# Patient Record
Sex: Male | Born: 1955 | Race: White | Hispanic: No | Smoking: Former smoker
Health system: Southern US, Community
[De-identification: ages and names within clinical notes are randomized; demographics above are authoritative.]

## PROBLEM LIST (undated history)

## (undated) DIAGNOSIS — F32A Depression, unspecified: Secondary | ICD-10-CM

## (undated) DIAGNOSIS — M1990 Unspecified osteoarthritis, unspecified site: Secondary | ICD-10-CM

## (undated) DIAGNOSIS — G709 Myoneural disorder, unspecified: Secondary | ICD-10-CM

## (undated) DIAGNOSIS — C4491 Basal cell carcinoma of skin, unspecified: Secondary | ICD-10-CM

## (undated) DIAGNOSIS — E7800 Pure hypercholesterolemia, unspecified: Secondary | ICD-10-CM

## (undated) DIAGNOSIS — Z8739 Personal history of other diseases of the musculoskeletal system and connective tissue: Secondary | ICD-10-CM

## (undated) DIAGNOSIS — F1910 Other psychoactive substance abuse, uncomplicated: Secondary | ICD-10-CM

## (undated) DIAGNOSIS — G4730 Sleep apnea, unspecified: Secondary | ICD-10-CM

## (undated) DIAGNOSIS — I10 Essential (primary) hypertension: Secondary | ICD-10-CM

## (undated) DIAGNOSIS — G4733 Obstructive sleep apnea (adult) (pediatric): Secondary | ICD-10-CM

## (undated) DIAGNOSIS — Z87442 Personal history of urinary calculi: Secondary | ICD-10-CM

## (undated) DIAGNOSIS — Z9989 Dependence on other enabling machines and devices: Secondary | ICD-10-CM

## (undated) HISTORY — DX: Myoneural disorder, unspecified: G70.9

## (undated) HISTORY — PX: BACK SURGERY: SHX140

## (undated) HISTORY — PX: FRACTURE SURGERY: SHX138

## (undated) HISTORY — DX: Other psychoactive substance abuse, uncomplicated: F19.10

## (undated) HISTORY — PX: COLONOSCOPY: SHX174

## (undated) HISTORY — DX: Depression, unspecified: F32.A

## (undated) HISTORY — DX: Basal cell carcinoma of skin, unspecified: C44.91

## (undated) HISTORY — PX: SPINE SURGERY: SHX786

## (undated) HISTORY — PX: HERNIA REPAIR: SHX51

## (undated) HISTORY — PX: BASAL CELL CARCINOMA EXCISION: SHX1214

## (undated) HISTORY — DX: Essential (primary) hypertension: I10

## (undated) HISTORY — PX: UMBILICAL HERNIA REPAIR: SHX196

---

## 1976-07-27 HISTORY — PX: PATELLA FRACTURE SURGERY: SHX735

## 1999-07-28 HISTORY — PX: HEMIARTHROPLASTY SHOULDER FRACTURE: SUR653

## 2008-12-06 LAB — HM COLONOSCOPY: HM COLON: NORMAL

## 2014-10-29 ENCOUNTER — Ambulatory Visit (INDEPENDENT_AMBULATORY_CARE_PROVIDER_SITE_OTHER): Payer: Managed Care, Other (non HMO) | Admitting: Internal Medicine

## 2014-10-29 ENCOUNTER — Other Ambulatory Visit (INDEPENDENT_AMBULATORY_CARE_PROVIDER_SITE_OTHER): Payer: Managed Care, Other (non HMO)

## 2014-10-29 ENCOUNTER — Encounter: Payer: Self-pay | Admitting: Internal Medicine

## 2014-10-29 VITALS — BP 122/84 | HR 71 | Temp 98.0°F | Resp 16 | Ht 72.0 in | Wt 253.0 lb

## 2014-10-29 DIAGNOSIS — Z23 Encounter for immunization: Secondary | ICD-10-CM | POA: Diagnosis not present

## 2014-10-29 DIAGNOSIS — I10 Essential (primary) hypertension: Secondary | ICD-10-CM | POA: Diagnosis not present

## 2014-10-29 DIAGNOSIS — E785 Hyperlipidemia, unspecified: Secondary | ICD-10-CM

## 2014-10-29 DIAGNOSIS — M170 Bilateral primary osteoarthritis of knee: Secondary | ICD-10-CM | POA: Diagnosis not present

## 2014-10-29 LAB — COMPREHENSIVE METABOLIC PANEL
ALT: 38 U/L (ref 0–53)
AST: 25 U/L (ref 0–37)
Albumin: 4.5 g/dL (ref 3.5–5.2)
Alkaline Phosphatase: 58 U/L (ref 39–117)
BILIRUBIN TOTAL: 0.5 mg/dL (ref 0.2–1.2)
BUN: 21 mg/dL (ref 6–23)
CALCIUM: 10.1 mg/dL (ref 8.4–10.5)
CHLORIDE: 99 meq/L (ref 96–112)
CO2: 31 mEq/L (ref 19–32)
CREATININE: 1.17 mg/dL (ref 0.40–1.50)
GFR: 67.57 mL/min (ref >60.00)
Glucose, Bld: 93 mg/dL (ref 70–99)
Potassium: 4.5 mEq/L (ref 3.5–5.1)
Sodium: 137 mEq/L (ref 135–145)
Total Protein: 7.6 g/dL (ref 6.0–8.3)

## 2014-10-29 LAB — URINALYSIS, ROUTINE W REFLEX MICROSCOPIC
Bilirubin Urine: NEGATIVE
Hgb urine dipstick: NEGATIVE
Ketones, ur: NEGATIVE
Leukocytes, UA: NEGATIVE
NITRITE: NEGATIVE
PH: 5.5 (ref 5.0–8.0)
RBC / HPF: NONE SEEN (ref 0–?)
SPECIFIC GRAVITY, URINE: 1.02 (ref 1.000–1.030)
TOTAL PROTEIN, URINE-UPE24: NEGATIVE
URINE GLUCOSE: NEGATIVE
Urobilinogen, UA: 0.2 (ref 0.0–1.0)

## 2014-10-29 LAB — CBC WITH DIFFERENTIAL/PLATELET
BASOS PCT: 0.8 % (ref 0.0–3.0)
Basophils Absolute: 0.1 10*3/uL (ref 0.0–0.1)
EOS ABS: 0.3 10*3/uL (ref 0.0–0.7)
Eosinophils Relative: 3.7 % (ref 0.0–5.0)
HCT: 46 % (ref 39.0–52.0)
Hemoglobin: 16 g/dL (ref 13.0–17.0)
LYMPHS ABS: 2 10*3/uL (ref 0.7–4.0)
LYMPHS PCT: 25.1 % (ref 12.0–46.0)
MCHC: 34.8 g/dL (ref 30.0–36.0)
MCV: 90.3 fl (ref 78.0–100.0)
MONO ABS: 0.6 10*3/uL (ref 0.1–1.0)
Monocytes Relative: 7.9 % (ref 3.0–12.0)
NEUTROS PCT: 62.5 % (ref 43.0–77.0)
Neutro Abs: 5 10*3/uL (ref 1.4–7.7)
Platelets: 299 10*3/uL (ref 150.0–400.0)
RBC: 5.1 Mil/uL (ref 4.22–5.81)
RDW: 12.9 % (ref 11.5–15.5)
WBC: 7.9 10*3/uL (ref 4.0–10.5)

## 2014-10-29 LAB — TSH: TSH: 1.19 u[IU]/mL (ref 0.35–4.50)

## 2014-10-29 LAB — LIPID PANEL
CHOLESTEROL: 201 mg/dL — AB (ref 0–200)
HDL: 55.1 mg/dL (ref >39.00)
NonHDL: 145.9
TRIGLYCERIDES: 311 mg/dL — AB (ref 0.0–149.0)
Total CHOL/HDL Ratio: 4
VLDL: 62.2 mg/dL — ABNORMAL HIGH (ref 0.0–40.0)

## 2014-10-29 LAB — LDL CHOLESTEROL, DIRECT: LDL DIRECT: 104 mg/dL

## 2014-10-29 MED ORDER — DICLOFENAC SODIUM 2 % TD SOLN: 4.0000 | Freq: Two times a day (BID) | TRANSDERMAL | Status: DC

## 2014-10-29 NOTE — Progress Notes (Signed)
   Subjective:    Patient ID: Henry Figueroa, male    DOB: 1955-11-16, 59 y.o.   MRN: [id number]  Hypertension This is a chronic problem. The current episode started more than 1 year ago. The problem has been gradually improving since onset. The problem is controlled. Pertinent negatives include no anxiety, blurred vision, chest pain, headaches, malaise/fatigue, neck pain, orthopnea, palpitations, peripheral edema, PND, shortness of breath or sweats. Past treatments include angiotensin blockers and diuretics. The current treatment provides significant improvement. There are no compliance problems.       Review of Systems  Constitutional: Negative.  Negative for fever, chills, malaise/fatigue, diaphoresis, appetite change and fatigue.  HENT: Negative.   Eyes: Negative.  Negative for blurred vision.  Respiratory: Negative.  Negative for cough, choking, chest tightness, shortness of breath and stridor.   Cardiovascular: Negative.  Negative for chest pain, palpitations, orthopnea, leg swelling and PND.  Gastrointestinal: Negative.  Negative for nausea, vomiting, abdominal pain, diarrhea and constipation.  Endocrine: Negative.   Genitourinary: Negative.   Musculoskeletal: Positive for arthralgias. Negative for myalgias, back pain, joint swelling, gait problem, neck pain and neck stiffness.       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  Skin: Negative.   Allergic/Immunologic: Negative.   Neurological: Negative.  Negative for dizziness, tremors, weakness, light-headedness, numbness and headaches.  Hematological: Negative.  Negative for adenopathy. Does not bruise/bleed easily.  Psychiatric/Behavioral: Negative.        Objective:   Physical Exam  Constitutional: He is oriented to person, place, and time. He appears well-developed and well-nourished. No distress.  HENT:  Head: Normocephalic and  atraumatic.  Mouth/Throat: Oropharynx is clear and moist. No oropharyngeal exudate.  Eyes: Conjunctivae are normal. Right eye exhibits no discharge. Left eye exhibits no discharge. No scleral icterus.  Neck: Normal range of motion. Neck supple. No JVD present. No tracheal deviation present. No thyromegaly present.  Cardiovascular: Normal rate, regular rhythm, normal heart sounds and intact distal pulses.  Exam reveals no gallop and no friction rub.   No murmur heard. Pulmonary/Chest: Effort normal and breath sounds normal. No stridor. No respiratory distress. He has no wheezes. He has no rales. He exhibits no tenderness.  Abdominal: Soft. Bowel sounds are normal. He exhibits no distension and no mass. There is no tenderness. There is no rebound and no guarding.  Musculoskeletal: Normal range of motion. He exhibits no edema.       Right knee: Normal.       Left knee: Normal.  Lymphadenopathy:    He has no cervical adenopathy.  Neurological: He is oriented to person, place, and time.  Skin: Skin is warm and dry. No rash noted. He is not diaphoretic. No erythema. No pallor.  Psychiatric: He has a normal mood and affect. His behavior is normal. Judgment and thought content normal.  Vitals reviewed.   No results found for: WBC, HGB, HCT, PLT, GLUCOSE, CHOL, TRIG, HDL, LDLDIRECT, LDLCALC, ALT, AST, NA, K, CL, CREATININE, BUN, CO2, TSH, PSA, INR, GLUF, HGBA1C, MICROALBUR      Assessment & Plan:

## 2014-10-29 NOTE — Patient Instructions (Signed)

## 2014-10-29 NOTE — Progress Notes (Signed)
Pre visit review using our clinic review tool, if applicable. No additional management support is needed unless otherwise documented below in the visit note. 

## 2014-10-29 NOTE — Assessment & Plan Note (Signed)
His BP is well controlled Will monitor his lytes and renal function today

## 2014-10-29 NOTE — Assessment & Plan Note (Signed)
FLP today Will treat if indicated

## 2014-10-29 NOTE — Assessment & Plan Note (Signed)
Will start Pennsaid for this

## 2014-10-30 ENCOUNTER — Encounter: Payer: Self-pay | Admitting: Internal Medicine

## 2014-11-20 ENCOUNTER — Telehealth: Payer: Self-pay | Admitting: Internal Medicine

## 2014-11-20 DIAGNOSIS — M170 Bilateral primary osteoarthritis of knee: Secondary | ICD-10-CM

## 2014-11-20 NOTE — Telephone Encounter (Signed)
Pharmacy needs prior authorization for Diclofenac Sodium (PENNSAID) 2 % SOLN [[id number]. [employer] insurance prefers Voltaren gel. Prior authorization # is [id number] if you want to keep PENNSAID.

## 2014-11-23 MED ORDER — DICLOFENAC SODIUM 1 % TD GEL: 4.0000 g | Freq: Four times a day (QID) | TRANSDERMAL | Status: DC

## 2014-11-23 NOTE — Telephone Encounter (Signed)
Please advise if you want to change or proceed with PA. Thanks

## 2014-11-23 NOTE — Telephone Encounter (Signed)
changed

## 2014-12-04 ENCOUNTER — Telehealth: Payer: Self-pay | Admitting: Internal Medicine

## 2014-12-04 MED ORDER — VALSARTAN-HYDROCHLOROTHIAZIDE 320-25 MG PO TABS: 1.0000 | ORAL_TABLET | Freq: Every day | ORAL | Status: DC

## 2014-12-04 NOTE — Telephone Encounter (Signed)
Pt called and would like a refill on her valsartan-hydrochlorothiazide (DIOVAN-HCT) 320-25 MG per tablet [[id number]] .     [facility] [address] [facility] [place]

## 2014-12-04 NOTE — Telephone Encounter (Signed)
Done

## 2015-03-26 ENCOUNTER — Ambulatory Visit (INDEPENDENT_AMBULATORY_CARE_PROVIDER_SITE_OTHER): Payer: Managed Care, Other (non HMO) | Admitting: Internal Medicine

## 2015-03-26 ENCOUNTER — Other Ambulatory Visit (INDEPENDENT_AMBULATORY_CARE_PROVIDER_SITE_OTHER): Payer: Managed Care, Other (non HMO)

## 2015-03-26 ENCOUNTER — Other Ambulatory Visit: Payer: Self-pay | Admitting: *Deleted

## 2015-03-26 ENCOUNTER — Encounter: Payer: Self-pay | Admitting: Internal Medicine

## 2015-03-26 VITALS — BP 118/70 | HR 86 | Temp 97.8°F | Resp 16 | Ht 71.0 in | Wt 269.0 lb

## 2015-03-26 DIAGNOSIS — G609 Hereditary and idiopathic neuropathy, unspecified: Secondary | ICD-10-CM

## 2015-03-26 DIAGNOSIS — G629 Polyneuropathy, unspecified: Secondary | ICD-10-CM

## 2015-03-26 DIAGNOSIS — C44519 Basal cell carcinoma of skin of other part of trunk: Secondary | ICD-10-CM

## 2015-03-26 DIAGNOSIS — G608 Other hereditary and idiopathic neuropathies: Secondary | ICD-10-CM

## 2015-03-26 LAB — CBC WITH DIFFERENTIAL/PLATELET
BASOS ABS: 0 10*3/uL (ref 0.0–0.1)
Basophils Relative: 0.4 % (ref 0.0–3.0)
EOS ABS: 0.2 10*3/uL (ref 0.0–0.7)
Eosinophils Relative: 3.3 % (ref 0.0–5.0)
HEMATOCRIT: 44.9 % (ref 39.0–52.0)
Hemoglobin: 15.4 g/dL (ref 13.0–17.0)
LYMPHS ABS: 2.1 10*3/uL (ref 0.7–4.0)
LYMPHS PCT: 31.5 % (ref 12.0–46.0)
MCHC: 34.4 g/dL (ref 30.0–36.0)
MCV: 94 fl (ref 78.0–100.0)
MONOS PCT: 9 % (ref 3.0–12.0)
Monocytes Absolute: 0.6 10*3/uL (ref 0.1–1.0)
NEUTROS ABS: 3.7 10*3/uL (ref 1.4–7.7)
NEUTROS PCT: 55.8 % (ref 43.0–77.0)
PLATELETS: 240 10*3/uL (ref 150.0–400.0)
RBC: 4.78 Mil/uL (ref 4.22–5.81)
RDW: 13.5 % (ref 11.5–15.5)
WBC: 6.6 10*3/uL (ref 4.0–10.5)

## 2015-03-26 LAB — BASIC METABOLIC PANEL
BUN: 22 mg/dL (ref 6–23)
CALCIUM: 10.4 mg/dL (ref 8.4–10.5)
CO2: 31 meq/L (ref 19–32)
CREATININE: 1.34 mg/dL (ref 0.40–1.50)
Chloride: 102 mEq/L (ref 96–112)
GFR: 57.7 mL/min — AB (ref >60.00)
Glucose, Bld: 103 mg/dL — ABNORMAL HIGH (ref 70–99)
Potassium: 3.9 mEq/L (ref 3.5–5.1)
SODIUM: 142 meq/L (ref 135–145)

## 2015-03-26 LAB — SEDIMENTATION RATE: Sed Rate: 6 mm/hr (ref 0–22)

## 2015-03-26 LAB — TSH: TSH: 1.1 u[IU]/mL (ref 0.35–4.50)

## 2015-03-26 LAB — HEMOGLOBIN A1C: HEMOGLOBIN A1C: 5.1 % (ref 4.6–6.5)

## 2015-03-26 LAB — FOLATE: Folate: >24.8 ng/mL (ref >5.9)

## 2015-03-26 LAB — VITAMIN B12: VITAMIN B 12: 386 pg/mL (ref 211–911)

## 2015-03-26 NOTE — Progress Notes (Signed)
Subjective:  Patient ID: Henry Figueroa, male    DOB: 15-Mar-1956  Age: 59 y.o. MRN: [id number]  CC: Numbness   HPI Laymon Stockert presents for a several year history of numbness, symmetrically in both feet from his toes to his forefoot. He has also noticed a lesion on his right mid back for several months that he said feels like a scar.  Outpatient Prescriptions Prior to Visit  Medication Sig Dispense Refill  . diclofenac sodium (VOLTAREN) 1 % GEL Apply 4 g topically 4 (four) times daily. 100 g 11  . valsartan-hydrochlorothiazide (DIOVAN-HCT) 320-25 MG per tablet Take 1 tablet by mouth daily. 90 tablet 3   No facility-administered medications prior to visit.    ROS Review of Systems  Constitutional: Negative.   HENT: Negative.   Eyes: Negative.  Negative for photophobia and visual disturbance.  Respiratory: Negative.  Negative for cough, choking, chest tightness, shortness of breath and stridor.   Cardiovascular: Negative.  Negative for chest pain, palpitations and leg swelling.  Gastrointestinal: Negative.  Negative for abdominal pain, diarrhea and constipation.  Endocrine: Negative.   Genitourinary: Negative.   Musculoskeletal: Negative.  Negative for myalgias, back pain, joint swelling, arthralgias and neck pain.  Skin: Positive for rash. Negative for color change and pallor.  Allergic/Immunologic: Negative.   Neurological: Positive for numbness. Negative for dizziness, tremors, seizures, syncope, facial asymmetry, speech difficulty, weakness, light-headedness and headaches.  Hematological: Negative.  Negative for adenopathy. Does not bruise/bleed easily.  Psychiatric/Behavioral: Negative.     Objective:  BP 118/70 mmHg  Pulse 86  Temp(Src) 97.8 F (36.6 C) (Oral)  Resp 16  Ht 5\' 11"  (1.803 m)  Wt 269 lb (122.018 kg)  BMI 37.53 kg/m2  SpO2 97%  BP Readings from Last 3 Encounters:  03/26/15 118/70  10/29/14 122/84    Wt Readings from Last 3 Encounters:  03/26/15  269 lb (122.018 kg)  10/29/14 253 lb (114.76 kg)    Physical Exam  Constitutional: He is oriented to person, place, and time. He appears well-developed and well-nourished. No distress.  HENT:  Head: Normocephalic and atraumatic.  Mouth/Throat: Oropharynx is clear and moist. No oropharyngeal exudate.  Eyes: Conjunctivae and EOM are normal. Pupils are equal, round, and reactive to light. Right eye exhibits no discharge. Left eye exhibits no discharge. No scleral icterus.  Neck: Normal range of motion. Neck supple. No JVD present. No tracheal deviation present. No thyromegaly present.  Cardiovascular: Normal rate, regular rhythm, normal heart sounds and intact distal pulses.  Exam reveals no gallop and no friction rub.   No murmur heard. Pulses:      Carotid pulses are 1+ on the right side, and 1+ on the left side.      Radial pulses are 1+ on the right side, and 1+ on the left side.       Femoral pulses are 1+ on the right side, and 1+ on the left side.      Popliteal pulses are 1+ on the right side, and 1+ on the left side.       Dorsalis pedis pulses are 1+ on the right side, and 1+ on the left side.       Posterior tibial pulses are 1+ on the right side, and 1+ on the left side.  Pulmonary/Chest: Effort normal and breath sounds normal. No stridor. No respiratory distress. He has no wheezes. He has no rales. He exhibits no tenderness.  Abdominal: Soft. Bowel sounds are normal. He exhibits no distension  and no mass. There is no tenderness. There is no rebound and no guarding.  Musculoskeletal: Normal range of motion. He exhibits no edema.       Arms: Lymphadenopathy:    He has no cervical adenopathy.  Neurological: He is alert and oriented to person, place, and time. He has normal strength. He displays no atrophy, no tremor and normal reflexes. A sensory deficit is present. No cranial nerve deficit. He exhibits normal muscle tone. He displays a negative Romberg sign. He displays no seizure  activity. Coordination and gait normal.  Reflex Scores:      Tricep reflexes are 0 on the right side and 0 on the left side.      Bicep reflexes are 0 on the right side and 0 on the left side.      Brachioradialis reflexes are 0 on the right side and 0 on the left side.      Patellar reflexes are 1+ on the right side and 0 on the left side.      Achilles reflexes are 0 on the right side and 0 on the left side. Over the first MTP joint there is mild decreased vibratory sensation. There is also decreased sensation of hot and cold in the forefoot.  Skin: Skin is warm and dry. No rash noted. He is not diaphoretic. No erythema. No pallor.  Psychiatric: He has a normal mood and affect. His behavior is normal. Judgment and thought content normal.  Vitals reviewed.   Lab Results  Component Value Date   WBC 7.9 10/29/2014   HGB 16.0 10/29/2014   HCT 46.0 10/29/2014   PLT 299.0 10/29/2014   GLUCOSE 93 10/29/2014   CHOL 201* 10/29/2014   TRIG 311.0* 10/29/2014   HDL 55.10 10/29/2014   LDLDIRECT 104.0 10/29/2014   ALT 38 10/29/2014   AST 25 10/29/2014   NA 137 10/29/2014   K 4.5 10/29/2014   CL 99 10/29/2014   CREATININE 1.17 10/29/2014   BUN 21 10/29/2014   CO2 31 10/29/2014   TSH 1.19 10/29/2014    Patient was never admitted.  Assessment & Plan:   Sage was seen today for numbness.  Diagnoses and all orders for this visit:  Basal cell carcinoma of back- though I don't have a pathology diagnosis this appears very suspicious for basal cell carcinoma, I offered to do a biopsy of the lesion today but he requests being referred directly to dermatology. 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 I have ordered labs to screen for a lymphoproliferative disease, HIV, vasculitis, syphilis, Wilson's disease, vitamin B12 or folate deficiency and thyroid disease. I've asked him to have  a nerve conduction study and EMG of both feet to better identify the cause for his numbness. -     Basic metabolic panel; Future -     CBC with Differential/Platelet; Future -     TSH; Future -     SPEP & IFE with QIG; Future -     Sedimentation rate; Future -     Hemoglobin A1c; Future -     Ceruloplasmin; Future -     RPR; Future -     HIV antibody; Future -     Vitamin B12; Future -     Folate; Future -     Methylmalonic acid, serum; Future -     Ambulatory referral to Neurology  I am having Mr. Louie Casa maintain his diclofenac sodium and valsartan-hydrochlorothiazide.  No orders of the defined types were placed in this encounter.     Follow-up: Return in about 3 months (around 06/26/2015).  Scarlette Calico, MD

## 2015-03-26 NOTE — Patient Instructions (Signed)

## 2015-03-26 NOTE — Progress Notes (Signed)
Pre visit review using our clinic review tool, if applicable. No additional management support is needed unless otherwise documented below in the visit note. 

## 2015-03-27 LAB — HIV ANTIBODY (ROUTINE TESTING W REFLEX): HIV 1&2 Ab, 4th Generation: NONREACTIVE

## 2015-03-27 LAB — RPR

## 2015-03-28 LAB — SPEP & IFE WITH QIG
ALBUMIN ELP: 5 g/dL — AB (ref 3.8–4.8)
ALPHA-1-GLOBULIN: 0.3 g/dL (ref 0.2–0.3)
ALPHA-2-GLOBULIN: 0.6 g/dL (ref 0.5–0.9)
BETA 2: 0.5 g/dL (ref 0.2–0.5)
BETA GLOBULIN: 0.5 g/dL (ref 0.4–0.6)
GAMMA GLOBULIN: 1.3 g/dL (ref 0.8–1.7)
IGA: 321 mg/dL (ref 68–379)
IgG (Immunoglobin G), Serum: 1230 mg/dL (ref 650–1600)
IgM, Serum: 118 mg/dL (ref 41–251)
Total Protein, Serum Electrophoresis: 8.2 g/dL — ABNORMAL HIGH (ref 6.1–8.1)

## 2015-03-28 LAB — CERULOPLASMIN: CERULOPLASMIN: 22 mg/dL (ref 18–36)

## 2015-03-29 ENCOUNTER — Encounter: Payer: Self-pay | Admitting: Internal Medicine

## 2015-03-29 LAB — METHYLMALONIC ACID, SERUM: Methylmalonic Acid, Quant: 255 nmol/L (ref 87–318)

## 2015-04-09 ENCOUNTER — Encounter: Payer: Self-pay | Admitting: Internal Medicine

## 2015-04-09 ENCOUNTER — Ambulatory Visit (INDEPENDENT_AMBULATORY_CARE_PROVIDER_SITE_OTHER): Payer: Managed Care, Other (non HMO) | Admitting: Neurology

## 2015-04-09 DIAGNOSIS — G609 Hereditary and idiopathic neuropathy, unspecified: Secondary | ICD-10-CM

## 2015-04-09 DIAGNOSIS — M5417 Radiculopathy, lumbosacral region: Secondary | ICD-10-CM

## 2015-04-09 NOTE — Procedures (Signed)
[facility]  [place] [place]  [place], [place] [address] Tel: [phone] Fax:  [phone] Test Date:  04/09/2015  Patient: Henry Figueroa DOB: May 16, 1956 Physician: Narda Amber, DO  Sex: Male Height: 6\' 0"  Ref Phys: Scarlette Calico, M.D.  ID#: [id number] Temp: 35.2C Technician: Jerilynn Mages. Dean   Patient Complaints: This is a 59 year-old gentleman presenting for evaluation of bilateral feet numbness.   NCV & EMG Findings: Extensive electrodiagnostic testing of the right lower extremity and additional studies of the left shows:  1. Bilateral sural and superficial peroneal sensory responses are within normal limits. 2. Bilateral peroneal and tibial motor responses are within normal limits. 3. Bilateral H reflex studies are mildly prolonged. 4. Chronic motor axon loss changes are seen affecting the L5-S1 myotomes bilaterally, without accompanied active denervation.  Impression: 1. Chronic L5 radiculopathy affecting bilateral lower extremities, mild-to-moderate in degree electrically. 2. Chronic S1 radiculopathy affecting bilateral lower extremities, mild in degree electrically. 3. There is no evidence of a generalized sensorimotor polyneuropathy affecting the lower extremities.   ___________________________ Narda Amber, DO    Nerve Conduction Studies Anti Sensory Summary Table   Site NR Peak (ms) Norm Peak (ms) P-T Amp (V) Norm P-T Amp  Left Sup Peroneal Anti Sensory (Ant Lat Mall)  12 cm    3.3 <4.6 5.9 >3  Right Sup Peroneal Anti Sensory (Ant Lat Mall)  12 cm    3.7 <4.6 6.7 >3  Left Sural Anti Sensory (Lat Mall)  Calf    4.3 <4.6 4.6 >3  Right Sural Anti Sensory (Lat Mall)  Calf    4.0 <4.6 6.4 >3   Motor Summary Table   Site NR Onset (ms) Norm Onset (ms) O-P Amp (mV) Norm O-P Amp Site1 Site2 Delta-0 (ms) Dist (cm) Vel (m/s) Norm Vel (m/s)  Left Peroneal Motor (Ext Dig Brev)  Ankle    3.9 <6.0 5.4 >2.5 B Fib Ankle 8.2 37.0 45 >40  B Fib    12.1  4.7   Poplt B Fib 1.8 10.0 56 >40  Poplt    13.9  4.5         Right Peroneal Motor (Ext Dig Brev)  Ankle    3.9 <6.0 6.8 >2.5 B Fib Ankle 7.9 36.0 46 >40  B Fib    11.8  6.4  Poplt B Fib 1.9 10.0 53 >40  Poplt    13.7  6.2         Left Tibial Motor (Abd Hall Brev)  Ankle    3.4 <6.0 6.7 >4 Knee Ankle 10.0 44.0 44 >40  Knee    13.4  5.0         Right Tibial Motor (Abd Hall Brev)  Ankle    3.2 <6.0 5.7 >4 Knee Ankle 10.2 45.0 44 >40  Knee    13.4  4.1          H Reflex Studies   NR H-Lat (ms) Lat Norm (ms) L-R H-Lat (ms)  Left Tibial (Gastroc)     35.51 <35 0.41  Right Tibial (Gastroc)     35.10 <35 0.41   EMG   Side Muscle Ins Act Fibs Psw Fasc Number Recrt Dur Dur. Amp Amp. Poly Poly. Comment  Right BicepsFemS Nml Nml Nml Nml 1- Mod-R Some 1+ Some 1+ Nml Nml N/A  Right AntTibialis Nml Nml Nml Nml 1- Rapid Some 1+ Some 1+ Nml Nml N/A  Right Gastroc Nml Nml Nml Nml 1- Mod-R Some 1+ Nml  Nml Nml Nml N/A  Right Flex Dig Long Nml Nml Nml Nml 1- Rapid Some 1+ Some 1+ Nml Nml N/A  Right GluteusMed Nml Nml Nml Nml 1- Rapid Some 1+ Nml Nml Nml Nml N/A  Right RectFemoris Nml Nml Nml Nml Nml Nml Nml Nml Nml Nml Nml Nml N/A  Left AntTibialis Nml Nml Nml Nml 1- Rapid Some 1+ Some 1+ Some 1+ N/A  Left Flex Dig Long Nml Nml Nml Nml 1- Rapid Many 1+ Many 1+ Some 1+ N/A  Left Gastroc Nml Nml Nml Nml 1- Mod-R Some 1+ Nml Nml Nml Nml N/A  Left RectFemoris Nml Nml Nml Nml Nml Nml Nml Nml Nml Nml Nml Nml N/A  Left GluteusMed Nml Nml Nml Nml 1- Rapid Many 1+ Many 1+ Some 1+ N/A  Left BicepsFemS Nml Nml Nml Nml 1- Mod-R Few 1+ Few 1+ Nml Nml N/A      Waveforms:

## 2015-04-10 ENCOUNTER — Other Ambulatory Visit: Payer: Self-pay | Admitting: Internal Medicine

## 2015-04-10 DIAGNOSIS — M5416 Radiculopathy, lumbar region: Secondary | ICD-10-CM

## 2015-05-14 ENCOUNTER — Telehealth: Payer: Self-pay | Admitting: Neurology

## 2015-05-14 NOTE — Telephone Encounter (Signed)
Pt called for EMG results/ call back @ [phone]

## 2015-05-14 NOTE — Telephone Encounter (Signed)
Left message instructing patient to call ordering physician's office for results.

## 2015-05-16 ENCOUNTER — Encounter: Payer: Self-pay | Admitting: Internal Medicine

## 2015-05-27 ENCOUNTER — Other Ambulatory Visit: Payer: Managed Care, Other (non HMO)

## 2015-09-14 ENCOUNTER — Ambulatory Visit (INDEPENDENT_AMBULATORY_CARE_PROVIDER_SITE_OTHER): Payer: Managed Care, Other (non HMO) | Admitting: Internal Medicine

## 2015-09-14 ENCOUNTER — Encounter: Payer: Self-pay | Admitting: Internal Medicine

## 2015-09-14 VITALS — BP 130/84 | HR 70 | Temp 97.9°F | Resp 16 | Wt 252.0 lb

## 2015-09-14 DIAGNOSIS — G629 Polyneuropathy, unspecified: Secondary | ICD-10-CM

## 2015-09-14 DIAGNOSIS — G608 Other hereditary and idiopathic neuropathies: Secondary | ICD-10-CM

## 2015-09-14 DIAGNOSIS — M7062 Trochanteric bursitis, left hip: Secondary | ICD-10-CM

## 2015-09-14 DIAGNOSIS — M5416 Radiculopathy, lumbar region: Secondary | ICD-10-CM | POA: Diagnosis not present

## 2015-09-14 MED ORDER — MELOXICAM 15 MG PO TABS: 15.0000 mg | ORAL_TABLET | Freq: Every day | ORAL | Status: DC

## 2015-09-14 NOTE — Patient Instructions (Signed)
Hip Bursitis Bursitis is a swelling and soreness (inflammation) of a fluid-filled sac (bursa). This sac overlies and protects the joints.  CAUSES   Injury.  Overuse of the muscles surrounding the joint.  Arthritis.  Gout.  Infection.  Cold weather.  Inadequate warm-up and conditioning prior to activities. The cause may not be known.  SYMPTOMS   Mild to severe irritation.  Tenderness and swelling over the outside of the hip.  Pain with motion of the hip.  If the bursa becomes infected, a fever may be present. Redness, tenderness, and warmth will develop over the hip. Symptoms usually lessen in 3 to 4 weeks with treatment, but can come back. TREATMENT If conservative treatment does not work, your caregiver may advise draining the bursa and injecting cortisone into the area. This may speed up the healing process. This may also be used as an initial treatment of choice. HOME CARE INSTRUCTIONS   Apply ice to the affected area for 15-20 minutes every 3 to 4 hours while awake for the first 2 days. Put the ice in a plastic bag and place a towel between the bag of ice and your skin.  Rest the painful joint as much as possible, but continue to put the joint through a normal range of motion at least 4 times per day. When the pain lessens, begin normal, slow movements and usual activities to help prevent stiffness of the hip.  Only take over-the-counter or prescription medicines for pain, discomfort, or fever as directed by your caregiver.  Use crutches to limit weight bearing on the hip joint, if advised.  Elevate your painful hip to reduce swelling. Use pillows for propping and cushioning your legs and hips.  Gentle massage may provide comfort and decrease swelling. SEEK IMMEDIATE MEDICAL CARE IF:   Your pain increases even during treatment, or you are not improving.  You have a fever.  You have heat and inflammation over the involved bursa.  You have any other questions or  concerns. MAKE SURE YOU:   Understand these instructions.  Will watch your condition.  Will get help right away if you are not doing well or get worse.   This information is not intended to replace advice given to you by your health care provider. Make sure you discuss any questions you have with your health care provider.   Document Released: 01/02/2002 Document Revised: 10/05/2011 Document Reviewed: 02/12/2015 Elsevier Interactive Patient Education 2016 [employer]  

## 2015-09-14 NOTE — Progress Notes (Signed)
Pre visit review using our clinic review tool, if applicable. No additional management support is needed unless otherwise documented below in the visit note. 

## 2015-09-15 NOTE — Progress Notes (Signed)
Subjective:  Patient ID: Henry Figueroa, male    DOB: [birth year]  Age: 60 y.o. MRN: [id number]  CC: Back Pain and Hip Pain   HPI Arthor Stelma presents for the complaint of left hip pain for 3 weeks. There was no preceding trauma or injury. He describes a sharp pain over the outer aspect of his left hip that radiates down the lateral aspect of his left thigh. He has taken a few doses of Tylenol that does provide some symptom relief.  He also complains of a chronic achy low back pain. When I last saw him about 8 months ago he was being evaluated for paresthesias in his feet. He had an NCS/EMG done that raised some suspicion for lumbar radiculitis. He tells me that the paresthesias in his feet are a little better.   Outpatient Prescriptions Prior to Visit  Medication Sig Dispense Refill  . diclofenac sodium (VOLTAREN) 1 % GEL Apply 4 g topically 4 (four) times daily. 100 g 11  . valsartan-hydrochlorothiazide (DIOVAN-HCT) 320-25 MG per tablet Take 1 tablet by mouth daily. 90 tablet 3   No facility-administered medications prior to visit.    ROS Review of Systems  Constitutional: Negative.  Negative for fever, chills, diaphoresis, activity change and fatigue.  HENT: Positive for trouble swallowing.   Eyes: Negative.   Respiratory: Negative.  Negative for cough, choking, chest tightness, shortness of breath and stridor.   Cardiovascular: Negative.  Negative for chest pain and leg swelling.  Gastrointestinal: Negative.  Negative for nausea, vomiting, abdominal pain, diarrhea, constipation and blood in stool.  Endocrine: Negative.   Genitourinary: Negative.   Musculoskeletal: Positive for back pain and arthralgias (left hip only). Negative for myalgias, joint swelling, gait problem and neck pain.  Skin: Negative.  Negative for color change and rash.  Allergic/Immunologic: Negative.   Neurological: Positive for numbness (both feet). Negative for dizziness, tremors, weakness,  light-headedness and headaches.  Hematological: Negative.  Negative for adenopathy. Does not bruise/bleed easily.  Psychiatric/Behavioral: Negative.     Objective:  BP 130/84 mmHg  Pulse 70  Temp(Src) 97.9 F (36.6 C) (Oral)  Resp 16  Wt 252 lb (114.306 kg)  SpO2 97%  BP Readings from Last 3 Encounters:  09/14/15 130/84  03/26/15 118/70  10/29/14 122/84    Wt Readings from Last 3 Encounters:  09/14/15 252 lb (114.306 kg)  03/26/15 269 lb (122.018 kg)  10/29/14 253 lb (114.76 kg)    Physical Exam  Constitutional: He is oriented to person, place, and time. He appears well-developed and well-nourished. No distress.  HENT:  Head: Normocephalic and atraumatic.  Mouth/Throat: Oropharynx is clear and moist. No oropharyngeal exudate.  Eyes: Conjunctivae are normal. Right eye exhibits no discharge. Left eye exhibits no discharge. No scleral icterus.  Neck: Normal range of motion. Neck supple. No JVD present. No tracheal deviation present. No thyromegaly present.  Cardiovascular: Normal rate, regular rhythm, normal heart sounds and intact distal pulses.  Exam reveals no gallop and no friction rub.   No murmur heard. Pulmonary/Chest: Effort normal and breath sounds normal. No stridor. No respiratory distress. He has no wheezes. He has no rales. He exhibits no tenderness.  Abdominal: Soft. Bowel sounds are normal. He exhibits no distension and no mass. There is no tenderness. There is no rebound and no guarding.  Musculoskeletal: Normal range of motion. He exhibits no edema or tenderness.       Left hip: He exhibits bony tenderness. He exhibits normal range of motion, normal strength,  no tenderness, no swelling, no crepitus, no deformity and no laceration.       Lumbar back: Normal. He exhibits normal range of motion, no tenderness, no bony tenderness, no swelling, no edema, no deformity, no laceration, no pain and no spasm.  Left hip reveals point tenderness tp palpation over the left  greater trochanter  Lymphadenopathy:    He has no cervical adenopathy.  Neurological: He is oriented to person, place, and time. He has normal strength. He displays no atrophy, no tremor and normal reflexes. No cranial nerve deficit or sensory deficit. He exhibits normal muscle tone. He displays a negative Romberg sign. He displays no seizure activity. Coordination and gait normal.  Reflex Scores:      Tricep reflexes are 1+ on the right side and 1+ on the left side.      Bicep reflexes are 1+ on the right side and 1+ on the left side.      Brachioradialis reflexes are 1+ on the right side and 1+ on the left side.      Patellar reflexes are 1+ on the right side and 1+ on the left side.      Achilles reflexes are 1+ on the right side and 0 on the left side. Neg SLR in BLE  Skin: Skin is warm and dry. No rash noted. He is not diaphoretic. No erythema. No pallor.  Vitals reviewed.   Lab Results  Component Value Date   WBC 6.6 03/26/2015   HGB 15.4 03/26/2015   HCT 44.9 03/26/2015   PLT 240.0 03/26/2015   GLUCOSE 103* 03/26/2015   CHOL 201* 10/29/2014   TRIG 311.0* 10/29/2014   HDL 55.10 10/29/2014   LDLDIRECT 104.0 10/29/2014   ALT 38 10/29/2014   AST 25 10/29/2014   NA 142 03/26/2015   K 3.9 03/26/2015   CL 102 03/26/2015   CREATININE 1.34 03/26/2015   BUN 22 03/26/2015   CO2 31 03/26/2015   TSH 1.10 03/26/2015   HGBA1C 5.1 03/26/2015    Patient was never admitted.  Assessment & Plan:    I am having Mr. Cowger start on meloxicam. I am also having him maintain his diclofenac sodium and valsartan-hydrochlorothiazide.  Meds ordered this encounter  Medications  . meloxicam (MOBIC) 15 MG tablet    Sig: Take 1 tablet (15 mg total) by mouth daily.    Dispense:  30 tablet    Refill:  5     Follow-up: Return in about 2 months (around 11/12/2015).  Scarlette Calico, MD

## 2015-09-15 NOTE — Assessment & Plan Note (Signed)
I will start treating his pain with the anti-inflammatory Mobic.Henry Figueroa also asked him to do physical therapy for further treatment. If this does not relieve his discomfort and I told him the next step would be to refer him to an orthopedist to consider an injection at the site.

## 2015-09-15 NOTE — Assessment & Plan Note (Signed)
He never had the MRI of his lumbar spine that was ordered last September. I reordered it today. I will start Mobic for symptom relief.

## 2015-09-23 ENCOUNTER — Other Ambulatory Visit: Payer: Self-pay | Admitting: Internal Medicine

## 2015-09-24 ENCOUNTER — Other Ambulatory Visit: Payer: Managed Care, Other (non HMO)

## 2015-09-25 ENCOUNTER — Telehealth: Payer: Self-pay | Admitting: Internal Medicine

## 2015-09-25 ENCOUNTER — Telehealth: Payer: Self-pay

## 2015-09-25 MED ORDER — HYDROCODONE-ACETAMINOPHEN 5-325 MG PO TABS: 1.0000 | ORAL_TABLET | Freq: Four times a day (QID) | ORAL | Status: DC | PRN

## 2015-09-25 MED ORDER — METHOCARBAMOL 750 MG PO TABS: 750.0000 mg | ORAL_TABLET | Freq: Three times a day (TID) | ORAL | Status: DC | PRN

## 2015-09-25 NOTE — Telephone Encounter (Signed)
Patient was seen in Saturday clinic two weeks ago for bursitis. He was working out last night and pulled something from his back down to his hip, everyone is booked up this week in the office and he does not want to go to urgent care, please advise

## 2015-09-25 NOTE — Telephone Encounter (Signed)
Rx's written. 

## 2015-09-25 NOTE — Telephone Encounter (Signed)
Patient states he seen Dr. Ronnald Ramp this past Saturday and was diagnosed with bursitis.  He was working out last night and stretching afterwards.  He had pain that went from his hip to his back.  His back is better but his hip is still in pain.  Patient would like to know if he can either be worked in to see Dr. Ronnald Ramp or if Dr. Ronnald Ramp can prescribe him something for pain.

## 2015-09-29 ENCOUNTER — Other Ambulatory Visit: Payer: Self-pay | Admitting: Internal Medicine

## 2015-09-29 DIAGNOSIS — G629 Polyneuropathy, unspecified: Principal | ICD-10-CM

## 2015-09-29 DIAGNOSIS — G608 Other hereditary and idiopathic neuropathies: Secondary | ICD-10-CM

## 2015-10-02 ENCOUNTER — Encounter: Payer: Self-pay | Admitting: Rehabilitative and Restorative Service Providers"

## 2015-10-02 ENCOUNTER — Ambulatory Visit (INDEPENDENT_AMBULATORY_CARE_PROVIDER_SITE_OTHER): Payer: Managed Care, Other (non HMO) | Admitting: Rehabilitative and Restorative Service Providers"

## 2015-10-02 DIAGNOSIS — M7918 Myalgia, other site: Secondary | ICD-10-CM

## 2015-10-02 DIAGNOSIS — R29898 Other symptoms and signs involving the musculoskeletal system: Secondary | ICD-10-CM

## 2015-10-02 DIAGNOSIS — Z7409 Other reduced mobility: Secondary | ICD-10-CM

## 2015-10-02 DIAGNOSIS — M797 Fibromyalgia: Secondary | ICD-10-CM | POA: Diagnosis not present

## 2015-10-02 DIAGNOSIS — M25552 Pain in left hip: Secondary | ICD-10-CM | POA: Diagnosis not present

## 2015-10-02 DIAGNOSIS — R531 Weakness: Secondary | ICD-10-CM

## 2015-10-02 DIAGNOSIS — R6889 Other general symptoms and signs: Secondary | ICD-10-CM

## 2015-10-02 NOTE — Therapy (Signed)
[place], [place], [address] Phone: [phone]   Fax:  [phone]  Physical Therapy Evaluation  Patient Details  Name: Henry Figueroa MRN: [id number] Date of Birth: 06/14/1956 Referring Provider: Dr. Scarlette Calico  Encounter Date: 10/02/2015      PT End of Session - 10/02/15 1533    Visit Number 1   Number of Visits 12   Date for PT Re-Evaluation 11/13/15   PT Start Time 1435   PT Stop Time 1538   PT Time Calculation (min) 63 min   Activity Tolerance Patient tolerated treatment well      Past Medical History  Diagnosis Date  . Hypertension     History reviewed. No pertinent past surgical history.  There were no vitals filed for this visit.  Visit Diagnosis:  Pain, hip, left - Plan: PT plan of care cert/re-cert  Myofacial muscle pain - Plan: PT plan of care cert/re-cert  Weakness of left lower extremity - Plan: PT plan of care cert/re-cert  Decreased strength, endurance, and mobility - Plan: PT plan of care cert/re-cert      Subjective Assessment - 10/02/15 1435    Subjective Patient reports that he was doing cross fit with weights 09/24/15 and noticed he was having soreness in his quads and then 09/26/15 started having pain in the Lt hip. He experienced sharp pain in the LB. He was treated with meds with good improvement.    Pertinent History neuroma bilat feet ~2 yrs    How long can you sit comfortably? 2 hours    How long can you stand comfortably? 30-60 min    How long can you walk comfortably? 1 hour    Diagnostic tests none   Patient Stated Goals get rid of the pain and prevent pain from coming back    Currently in Pain? Yes   Pain Score 1    Pain Location Hip   Pain Orientation Left   Pain Descriptors / Indicators Aching   Pain Type Acute pain   Pain Onset 1 to 4 weeks ago   Pain Frequency Intermittent   Aggravating Factors  ascending or descending stairs   Pain Relieving  Factors crossing legs sometimes helps; meds; ice             [facility] PT Assessment - 10/02/15 0001    Assessment   Medical Diagnosis Lt trochanteric bursitis; LBP   Referring Provider Dr. Scarlette Calico   Onset Date/Surgical Date 09/24/15   Hand Dominance Right   Next MD Visit no return scheduled    Prior Therapy none   Precautions   Precautions None   Balance Screen   Has the patient fallen in the past 6 months No   Has the patient had a decrease in activity level because of a fear of falling?  No   Is the patient reluctant to leave their home because of a fear of falling?  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 - driving/sitting/lifting 20 lb; walking; climbing short ladders 3-6 ft; walking on wet floors    Leisure yard work; cooking; some cleaning; gym 5 days/wk - cardio and wt lifting free weights cross fit    Observation/Other Assessments   Focus on Therapeutic Outcomes (FOTO)  70% limitation    Sensation   Additional Comments the usual tingling and numbness the plantar surface of both feet intermittently for ~2 yrs; sometimes has cramping in bottom of feet    AROM   Right/Left Hip --  WFL tight Lt rotation   Right Knee Extension -14   Right Knee Flexion 121   Left Knee Extension -8   Left Knee Flexion 121   Lumbar Flexion 80%   Lumbar Extension 5%   Lumbar - Right Side Bend 40%   Lumbar - Left Side Bend 40%  "twinge" in the Rt side    Lumbar - Right Rotation 35%   Lumbar - Left Rotation 35%   Strength   Overall Strength Comments 5/5 bilat LE's except Lt hip ext and abd 4+/5    Flexibility   Hamstrings Rt 75 deg; Lt 70 deg    Quadriceps heel to buttock Rt 10 inches; Lt 8 inches    ITB tight Rt/Lt    Piriformis tight and painful Lt    Palpation    Spinal mobility WFL's    SI assessment  WFL's    Palpation comment tight Lt piriformis with banding at insertion at the greater trochanter    Ambulation/Gait   Gait Comments WFL's                    [facility] Adult PT Treatment/Exercise - 10/02/15 0001    Self-Care   Self-Care --  initiated spine care education    Therapeutic Activites    Therapeutic Activities --  myofacial ball release work Lt hip    Lumbar Exercises: Stretches   Passive Hamstring Stretch 3 reps;30 seconds   Standing Extension 2 reps  2-3 sec    Prone on Elbows Stretch 1 rep;60 seconds   Press Ups --  10 reps 2-3 sec    Quad Stretch 3 reps;30 seconds   ITB Stretch 3 reps;30 seconds   Piriformis Stretch 3 reps;30 seconds   Cryotherapy   Number Minutes Cryotherapy 15 Minutes   Cryotherapy Location Lumbar Spine;Hip  Lt   Type of Cryotherapy Ice pack   Electrical Stimulation   Electrical Stimulation Location Lt piriformis/hip    Electrical Stimulation Action IFC   Electrical Stimulation Parameters to tolerance   Electrical Stimulation Goals Pain;Tone                PT Education - 10/02/15 1516    Education provided Yes   Education Details HEP; spine care; TENS info    Person(s) Educated Patient   Methods Explanation;Demonstration;Tactile cues;Verbal cues;Handout   Comprehension Verbalized understanding;Returned demonstration;Verbal cues required;Tactile cues required             PT Long Term Goals - 10/02/15 1545    PT LONG TERM GOAL #1   Title Improve tissue extensibility Lt hip to palpation 11/13/15    Time 6   Period Weeks   Status New   PT LONG TERM GOAL #2   Title 5/5 LE strength 11/13/15   Time 6   Period Weeks   Status New   PT LONG TERM GOAL #3   Title Increase lumbar trunk extension in standing 10-20% 11/13/15   Time 6   Period Weeks   Status New   PT LONG TERM GOAL #4   Title patient verbalizes/demonstrates understanding of good body mechanics and exercise form  11/13/15   Time 6   Period Weeks   Status New   PT LONG TERM GOAL #5  Title I IN HEP 11/13/15   Time 6   Period Weeks   Status New   PT LONG TERM GOAL #6   Title Improve FOTO to </= 40% limitation 11/13/15   Time 6   Period Weeks   Status New               Plan - 10/02/15 1534    Clinical Impression Statement Mikki Santee presents with resolving Lt hip and LBP. He has limited ROM/strength Lt hip; tightness to palpation through Lt piriformis and hip abductors through trochanter. Patient will benefit form PT to address deficits as identified.    Pt will benefit from skilled therapeutic intervention in order to improve on the following deficits Postural dysfunction;Improper body mechanics;Decreased range of motion;Decreased mobility;Decreased strength;Pain;Increased fascial restricitons;Decreased endurance;Decreased activity tolerance   Rehab Potential Good   PT Frequency 2x / week   PT Duration 6 weeks   PT Treatment/Interventions Patient/family education;ADLs/Self Care Home Management;Therapeutic exercise;Therapeutic activities;Manual techniques;Dry needling;Neuromuscular re-education;Cryotherapy;Electrical Stimulation;Iontophoresis 4mg /ml Dexamethasone;Moist Heat;Ultrasound   PT Next Visit Plan stretch gastroc/soleus; core stabilization; deep tissue work Lt piriformis; modalities as indicated   PT Home Exercise Plan myofacial ball release work; HEP; Arts administrator and Agree with Plan of Care Patient         Problem List Patient Active Problem List   Diagnosis Date Noted  . Trochanteric bursitis of left hip 09/14/2015  . Lumbar radiculitis 04/10/2015  . Basal cell carcinoma of back 03/26/2015  . Sensory peripheral neuropathy ([place]) 03/26/2015  . Essential hypertension, benign 10/29/2014  . Hyperlipidemia with target LDL less than 160 10/29/2014  . Primary osteoarthritis of both knees 10/29/2014    Stephanieann Popescu Nilda Simmer PT, MPH  10/02/2015, 3:55 PM  [facility] [place], [place], [address] Phone: [phone]   Fax:  [phone]  Name: Taraj Yoffe MRN: [id number] Date of Birth: [birth year]

## 2015-10-02 NOTE — Patient Instructions (Addendum)
Self massage using a ~4 inch rubber ball   Trunk: Prone Extension (Press-Ups)    Lie on stomach on firm, flat surface. Relax bottom and legs. Raise chest in air with elbows straight. Keep hips flat on surface, sag stomach. Hold __2-3__ seconds. Repeat _10___ times. Do _2-3___ sessions per day. CAUTION: Movement should be gentle and slow.   Trunk Extension    Standing, place back of open hands on low back. Straighten spine then arch the back and move shoulders back. Repeat __2-3__ times per session. Do _several ___ sessions per day   Quads / HF, Prone    Lie face down, knees together. Grasp one ankle with same-side hand. Use towel if needed to reach. Gently pull foot toward buttock. Hold __30_ seconds. Repeat _2__ times per session. Do _2__ sessions per day.   HIP: Hamstrings - Supine    Place strap around foot. Raise leg up, keep knee straight. Hold __30_ seconds. __3_ reps per set, _2__ sets per day   Outer Hip Stretch: Reclined IT Band Stretch (Strap)    Strap around opposite foot, pull across only as far as possible with shoulders on mat. Hold for __30 sec. Repeat __3__ times each leg.  Piriformis Stretch    Lying on back, pull right knee toward opposite shoulder. Hold __30__ seconds. Repeat _3___ times. Do _2___ sessions per day.   Sleeping on Back  Place pillow under knees. A pillow with cervical support and a roll around waist are also helpful. Copyright  VHI. All rights reserved.  Sleeping on Side Place pillow between knees. Use cervical support under neck and a roll around waist as needed. Copyright  VHI. All rights reserved.   Sleeping on Stomach   If this is the only desirable sleeping position, place pillow under lower legs, and under stomach or chest as needed.  Posture - Sitting   Sit upright, head facing forward. Try using a roll to support lower back. Keep shoulders relaxed, and avoid rounded back. Keep hips level with knees. Avoid  crossing legs for long periods. Stand to Sit / Sit to Stand   To sit: Bend knees to lower self onto front edge of chair, then scoot back on seat. To stand: Reverse sequence by placing one foot forward, and scoot to front of seat. Use rocking motion to stand up.   Work Height and Reach  Ideal work height is no more than 2 to 4 inches below elbow level when standing, and at elbow level when sitting. Reaching should be limited to arm's length, with elbows slightly bent.  Bending  Bend at hips and knees, not back. Keep feet shoulder-width apart.    Posture - Standing   Good posture is important. Avoid slouching and forward head thrust. Maintain curve in low back and align ears over shoul- ders, hips over ankles.  Alternating Positions   Alternate tasks and change positions frequently to reduce fatigue and muscle tension. Take rest breaks. Computer Work   Position work to Programmer, multimedia. Use proper work and seat height. Keep shoulders back and down, wrists straight, and elbows at right angles. Use chair that provides full back support. Add footrest and lumbar roll as needed.  Getting Into / Out of Car  Lower self onto seat, scoot back, then bring in one leg at a time. Reverse sequence to get out.  Dressing  Lie on back to pull socks or slacks over feet, or sit and bend leg while keeping back straight.  Housework - Sink  Place one foot on ledge of cabinet under sink when standing at sink for prolonged periods.   Pushing / Pulling  Pushing is preferable to pulling. Keep back in proper alignment, and use leg muscles to do the work.  Deep Squat   Squat and lift with both arms held against upper trunk. Tighten stomach muscles without holding breath. Use smooth movements to avoid jerking.  Avoid Twisting   Avoid twisting or bending back. Pivot around using foot movements, and bend at knees if needed when reaching for articles.  Carrying Luggage   Distribute weight  evenly on both sides. Use a cart whenever possible. Do not twist trunk. Move body as a unit.   Lifting Principles .Maintain proper posture and head alignment. .Slide object as close as possible before lifting. .Move obstacles out of the way. .Test before lifting; ask for help if too heavy. .Tighten stomach muscles without holding breath. .Use smooth movements; do not jerk. .Use legs to do the work, and pivot with feet. .Distribute the work load symmetrically and close to the center of trunk. .Push instead of pull whenever possible.   Ask For Help   Ask for help and delegate to others when possible. Coordinate your movements when lifting together, and maintain the low back curve.  Log Roll   Lying on back, bend left knee and place left arm across chest. Roll all in one movement to the right. Reverse to roll to the left. Always move as one unit. Housework - Sweeping  Use long-handled equipment to avoid stooping.   Housework - Wiping  Position yourself as close as possible to reach work surface. Avoid straining your back.  Laundry - Unloading Wash   To unload small items at bottom of washer, lift leg opposite to arm being used to reach.  [place] close to area to be raked. Use arm movements to do the work. Keep back straight and avoid twisting.     Cart  When reaching into cart with one arm, lift opposite leg to keep back straight.   Getting Into / Out of Bed  Lower self to lie down on one side by raising legs and lowering head at the same time. Use arms to assist moving without twisting. Bend both knees to roll onto back if desired. To sit up, start from lying on side, and use same move-ments in reverse. Housework - Vacuuming  Hold the vacuum with arm held at side. Step back and forth to move it, keeping head up. Avoid twisting.   Laundry - IT consultant so that bending and twisting can be avoided.   Laundry - Unloading  Dryer  Squat down to reach into clothes dryer or use a reacher.  Gardening - Weeding / Probation officer or Kneel. Knee pads may be helpful.                    TENS UNIT: This is helpful for muscle pain and spasm.   Search and Purchase a TENS 7000 2nd edition at www.tenspros.com. It should be less than $30.     TENS unit instructions: Do not shower or bathe with the unit on Turn the unit off before removing electrodes or batteries If the electrodes lose stickiness add a drop of water to the electrodes after they are disconnected from the unit and place on plastic sheet. If you continued to have difficulty, call the TENS unit company to  purchase more electrodes. Do not apply lotion on the skin area prior to use. Make sure the skin is clean and dry as this will help prolong the life of the electrodes. After use, always check skin for unusual red areas, rash or other skin difficulties. If there are any skin problems, does not apply electrodes to the same area. Never remove the electrodes from the unit by pulling the wires. Do not use the TENS unit or electrodes other than as directed. Do not change electrode placement without consultating your therapist or physician. Keep 2 fingers with between each electrode.

## 2015-10-08 ENCOUNTER — Encounter: Payer: Managed Care, Other (non HMO) | Admitting: Rehabilitative and Restorative Service Providers"

## 2015-10-11 ENCOUNTER — Ambulatory Visit (INDEPENDENT_AMBULATORY_CARE_PROVIDER_SITE_OTHER): Payer: Managed Care, Other (non HMO) | Admitting: Rehabilitative and Restorative Service Providers"

## 2015-10-11 ENCOUNTER — Encounter: Payer: Self-pay | Admitting: Rehabilitative and Restorative Service Providers"

## 2015-10-11 DIAGNOSIS — R6889 Other general symptoms and signs: Secondary | ICD-10-CM

## 2015-10-11 DIAGNOSIS — R531 Weakness: Secondary | ICD-10-CM

## 2015-10-11 DIAGNOSIS — M797 Fibromyalgia: Secondary | ICD-10-CM

## 2015-10-11 DIAGNOSIS — Z7409 Other reduced mobility: Secondary | ICD-10-CM | POA: Diagnosis not present

## 2015-10-11 DIAGNOSIS — M7918 Myalgia, other site: Secondary | ICD-10-CM

## 2015-10-11 DIAGNOSIS — R29898 Other symptoms and signs involving the musculoskeletal system: Secondary | ICD-10-CM | POA: Diagnosis not present

## 2015-10-11 DIAGNOSIS — M25552 Pain in left hip: Secondary | ICD-10-CM

## 2015-10-11 NOTE — Therapy (Signed)
[place], [place], [address] Phone: [phone]   Fax:  [phone]  Physical Therapy Treatment  Patient Details  Name: Henry Figueroa MRN: [id number] Date of Birth: 09/30/1955 Referring Provider: Dr. Scarlette Calico  Encounter Date: 10/11/2015      PT End of Session - 10/11/15 0808    Visit Number 2   Number of Visits 12   Date for PT Re-Evaluation 11/13/15   PT Start Time 0807   PT Stop Time 0909   PT Time Calculation (min) 62 min   Activity Tolerance Patient tolerated treatment well      Past Medical History  Diagnosis Date  . Hypertension     History reviewed. No pertinent past surgical history.  There were no vitals filed for this visit.  Visit Diagnosis:  Pain, hip, left  Myofacial muscle pain  Weakness of left lower extremity  Decreased strength, endurance, and mobility      Subjective Assessment - 10/11/15 0810    Subjective Hip feels tight this am - he has been doing his exercises without difficulty.    Currently in Pain? Yes   Pain Score 2    Pain Location Hip   Pain Orientation Left   Pain Descriptors / Indicators Sore   Pain Type Acute pain   Pain Onset 1 to 4 weeks ago   Pain Frequency Intermittent                         [facility] Adult PT Treatment/Exercise - 10/11/15 0001    Lumbar Exercises: Stretches   Passive Hamstring Stretch 3 reps;30 seconds   Standing Extension 2 reps  2-3 sec    Press Ups --  10 reps 2-3 sec    Quad Stretch 3 reps;30 seconds   ITB Stretch 3 reps;30 seconds   Piriformis Stretch 3 reps;30 seconds   Lumbar Exercises: Aerobic   Stationary Bike Nustep L5 x 5    Cryotherapy   Number Minutes Cryotherapy 15 Minutes   Cryotherapy Location Lumbar Spine;Hip  Lt   Type of Cryotherapy Ice pack   Electrical Stimulation   Electrical Stimulation Location Lt piriformis/hip    Electrical Stimulation Action IFC   Electrical  Stimulation Parameters to tolerance   Electrical Stimulation Goals Pain;Tone   Manual Therapy   Manual therapy comments pt prone   Joint Mobilization lumbar spine CPA mobs grade II and III   Soft tissue mobilization Lt piriformis and hip abductors    Myofascial Release Lt hip/back area                PT Education - 10/11/15 0820    Education provided Yes   Education Details HEP   Person(s) Educated Patient   Methods Explanation;Demonstration;Tactile cues;Verbal cues;Handout   Comprehension Verbalized understanding;Returned demonstration;Verbal cues required;Tactile cues required             PT Long Term Goals - 10/11/15 0823    PT LONG TERM GOAL #1   Title Improve tissue extensibility Lt hip to palpation 11/13/15    Time 6   Period Weeks   Status On-going   PT LONG TERM GOAL #2   Title 5/5 LE strength 11/13/15   Time 6   Period Weeks   Status On-going   PT LONG TERM GOAL #3   Title Increase lumbar trunk extension in standing 10-20% 11/13/15   Time 6   Period Weeks   Status On-going  PT LONG TERM GOAL #4   Title patient verbalizes/demonstrates understanding of good body mechanics and exercise form 11/13/15   Time 6   Period Weeks   Status On-going   PT LONG TERM GOAL #5   Title I IN HEP 11/13/15   Time 6   Period Weeks   Status On-going   PT LONG TERM GOAL #6   Title Improve FOTO to </= 40% limitation 11/13/15   Time 6   Period Weeks   Status On-going               Plan - 10/11/15 0820    Clinical Impression Statement Continued intermittent symptoms. Incresed discomfort today. Pt tolerated new exercises without difficulty. Progressing with rehab - no goals accomplished - second visit.    Pt will benefit from skilled therapeutic intervention in order to improve on the following deficits Postural dysfunction;Improper body mechanics;Decreased range of motion;Decreased mobility;Decreased strength;Pain;Increased fascial restricitons;Decreased  endurance;Decreased activity tolerance   Rehab Potential Good   PT Frequency 2x / week   PT Duration 6 weeks   PT Treatment/Interventions Patient/family education;ADLs/Self Care Home Management;Therapeutic exercise;Therapeutic activities;Manual techniques;Dry needling;Neuromuscular re-education;Cryotherapy;Electrical Stimulation;Iontophoresis 4mg /ml Dexamethasone;Moist Heat;Ultrasound   PT Next Visit Plan core stabilization - assess response to manual work    PT Home Exercise Plan myofacial ball release work; HEP; Arts administrator and Agree with Plan of Care Patient        Problem List Patient Active Problem List   Diagnosis Date Noted  . Trochanteric bursitis of left hip 09/14/2015  . Lumbar radiculitis 04/10/2015  . Basal cell carcinoma of back 03/26/2015  . Sensory peripheral neuropathy ([place]) 03/26/2015  . Essential hypertension, benign 10/29/2014  . Hyperlipidemia with target LDL less than 160 10/29/2014  . Primary osteoarthritis of both knees 10/29/2014    Nohealani Medinger Nilda Simmer PT, MPH  10/11/2015, 8:45 AM  [facility] [place], [place], [address] Phone: [phone]   Fax:  [phone]  Name: Henry Figueroa MRN: [id number] Date of Birth: 12/12/1955

## 2015-10-11 NOTE — Patient Instructions (Addendum)
Gastroc Stretch    Stand with right foot back, leg straight, forward leg bent. Keeping heel on floor, turned slightly out, lean into wall until stretch is felt in calf. Hold _30___ seconds. Repeat _3___ times per set. Do _2-3  sessions per day.   Achilles / Soleus, Standing    Stand, right foot behind, heel on floor and turned slightly out. Lower hips and bend knees. Hold _30__ seconds. Repeat _3__ times per session. Do _2-3__ sessions per day.

## 2015-10-15 ENCOUNTER — Ambulatory Visit (INDEPENDENT_AMBULATORY_CARE_PROVIDER_SITE_OTHER): Payer: Managed Care, Other (non HMO) | Admitting: Physical Therapy

## 2015-10-15 DIAGNOSIS — M25552 Pain in left hip: Secondary | ICD-10-CM

## 2015-10-15 DIAGNOSIS — M797 Fibromyalgia: Secondary | ICD-10-CM | POA: Diagnosis not present

## 2015-10-15 DIAGNOSIS — Z7409 Other reduced mobility: Secondary | ICD-10-CM | POA: Diagnosis not present

## 2015-10-15 DIAGNOSIS — R29898 Other symptoms and signs involving the musculoskeletal system: Secondary | ICD-10-CM

## 2015-10-15 DIAGNOSIS — M7918 Myalgia, other site: Secondary | ICD-10-CM

## 2015-10-15 DIAGNOSIS — R531 Weakness: Secondary | ICD-10-CM

## 2015-10-15 NOTE — Patient Instructions (Signed)
Abduction: Clam (Eccentric) - Side-Lying    Lie on side with knees bent. Lift top knee, keeping feet together. Keep trunk steady. Slowly lower for 3-5 seconds. __10_ reps per set, _2__ sets per day, __3_ days per week.  http://ecce.exer.us/64   Piriformis Stretch, Sitting    Sit, one ankle on opposite knee, same-side hand on crossed knee. Push down on knee, keeping spine straight. Lean torso forward, with flat back, until tension is felt in hamstrings and gluteals of crossed-leg side. Hold _30__ seconds.  Repeat _2__ times per session. Do _2__ sessions per day.   [facility] [place], [place] [address]  [phone] (office) [phone] (fax)

## 2015-10-15 NOTE — Therapy (Addendum)
[address] [place], [place], [address] Phone: [phone]   Fax:  [phone]  Physical Therapy Treatment  Patient Details  Name: Henry Figueroa MRN: [id number] Date of Birth: 1955/11/26 Referring Provider: Dr. Ronnald Ramp   Encounter Date: 10/15/2015      PT End of Session - 10/15/15 0808    Visit Number 3   Number of Visits 12   Date for PT Re-Evaluation 11/13/15   PT Start Time 0805   PT Stop Time 0900   PT Time Calculation (min) 55 min      Past Medical History  Diagnosis Date  . Hypertension     No past surgical history on file.  There were no vitals filed for this visit.  Visit Diagnosis:  Pain, hip, left  Myofacial muscle pain  Weakness of left lower extremity  Decreased strength, endurance, and mobility      Subjective Assessment - 10/15/15 0808    Subjective Left hip has been "twingy". Has been doing most of the exercises everyday. Feels therapy has helped some. Mornings are the worst.     Pertinent History neuroma bilat feet ~2 yrs    Patient Stated Goals get rid of the pain and prevent pain from coming back    Currently in Pain? Yes   Pain Score 3    Pain Location Hip   Pain Orientation Left   Pain Descriptors / Indicators Aching   Aggravating Factors  first thing in morning; inactivity   Pain Relieving Factors medicine, stretches.             [facility] PT Assessment - 10/15/15 0001    Assessment   Medical Diagnosis Lt trochanteric bursitis; LBP   Referring Provider Dr. Ronnald Ramp    Onset Date/Surgical Date 09/24/15   Hand Dominance Right   Next MD Visit PRN   Flexibility   Hamstrings Rt and Lt 65 deg            [facility] Adult PT Treatment/Exercise - 10/15/15 0001    Lumbar Exercises: Stretches   Passive Hamstring Stretch 3 reps;30 seconds  supine with strap   Passive Hamstring Stretch Limitations then seated with straight back x 30 sec x 2 each leg   Press Ups --  10 reps  2-3 sec    Quad Stretch 1 rep;60 seconds   Piriformis Stretch 2 reps;30 seconds  sitting   Lumbar Exercises: Aerobic   Stationary Bike Nustep L5 x 5    Lumbar Exercises: Supine   Bridge 15 reps  ball squeeze, 5 sec hold in extension   Lumbar Exercises: Sidelying   Clam 20 reps;1 second  each side    Cryotherapy   Number Minutes Cryotherapy 15 Minutes   Cryotherapy Location Hip  Lt, in sidelying    Type of Cryotherapy Ice pack   Electrical Stimulation   Electrical Stimulation Location Lt piriformis/hip    Electrical Stimulation Action IFC   Electrical Stimulation Parameters to tolerance    Electrical Stimulation Goals Pain;Tone                PT Education - 10/15/15 0859    Education provided Yes   Education Details HEP - added clam and piriformis stretch. Educated pt on self massage with ball (much softer than ball that started discomfort in hip)   Person(s) Educated Patient   Methods Explanation;Handout   Comprehension Verbalized understanding;Returned demonstration             PT Long  Term Goals - 10/11/15 0823    PT LONG TERM GOAL #1   Title Improve tissue extensibility Lt hip to palpation 11/13/15    Time 6   Period Weeks   Status On-going   PT LONG TERM GOAL #2   Title 5/5 LE strength 11/13/15   Time 6   Period Weeks   Status On-going   PT LONG TERM GOAL #3   Title Increase lumbar trunk extension in standing 10-20% 11/13/15   Time 6   Period Weeks   Status On-going   PT LONG TERM GOAL #4   Title patient verbalizes/demonstrates understanding of good body mechanics and exercise form 11/13/15   Time 6   Period Weeks   Status On-going   PT LONG TERM GOAL #5   Title I IN HEP 11/13/15   Time 6   Period Weeks   Status On-going   PT LONG TERM GOAL #6   Title Improve FOTO to </= 40% limitation 11/13/15   Time 6   Period Weeks   Status On-going               Plan - 10/15/15 0859    Clinical Impression Statement Pt continues with tightness  in bilat hamstrings and hip rotators (R>L).  Pt tolerated all exercises well without increase in pain.  Pt reported decrease in pain with use of estim and ice at end of session.  Gradually progressing towards goals.    Pt will benefit from skilled therapeutic intervention in order to improve on the following deficits Postural dysfunction;Improper body mechanics;Decreased range of motion;Decreased mobility;Decreased strength;Pain;Increased fascial restricitons;Decreased endurance;Decreased activity tolerance   Rehab Potential Good   PT Frequency 2x / week   PT Duration 6 weeks   PT Treatment/Interventions Patient/family education;ADLs/Self Care Home Management;Therapeutic exercise;Therapeutic activities;Manual techniques;Dry needling;Neuromuscular re-education;Cryotherapy;Electrical Stimulation;Iontophoresis 66m/ml Dexamethasone;Moist Heat;Ultrasound   PT Next Visit Plan core stabilization, continue progressive stretching.    Consulted and Agree with Plan of Care Patient        Problem List Patient Active Problem List   Diagnosis Date Noted  . Trochanteric bursitis of left hip 09/14/2015  . Lumbar radiculitis 04/10/2015  . Basal cell carcinoma of back 03/26/2015  . Sensory peripheral neuropathy [place] 03/26/2015  . Essential hypertension, benign 10/29/2014  . Hyperlipidemia with target LDL less than 160 10/29/2014  . Primary osteoarthritis of both knees 10/29/2014   JKerin Perna PTA 10/15/2015 1:13 PM  [facility] [place] [address] [address]Phone: [phone]  Fax:  [phone] Name: Henry JagielloMRN: [id number] of Birth: 301/16/1957   PHYSICAL THERAPY DISCHARGE SUMMARY  Visits from Start of Care: 3  Current functional level related to goals / functional outcomes: Patient reported some improvement in hip pain. He is doing some exercises at home but not as he should. Patient is tolerating  more exercise in the clinic    Remaining deficits: Continued pain and limitations    Education / Equipment: HEP Plan: Patient agrees to discharge.  Patient goals were partially met. Patient is being discharged due to not returning since the last visit.  ?????     Henry P. HHelene KelpPT, MPH 01/02/2016 11:51 AM

## 2015-10-18 ENCOUNTER — Encounter: Payer: Managed Care, Other (non HMO) | Admitting: Rehabilitative and Restorative Service Providers"

## 2015-10-21 ENCOUNTER — Encounter: Payer: Self-pay | Admitting: Neurology

## 2015-10-21 ENCOUNTER — Other Ambulatory Visit: Payer: Managed Care, Other (non HMO)

## 2015-10-21 ENCOUNTER — Ambulatory Visit (INDEPENDENT_AMBULATORY_CARE_PROVIDER_SITE_OTHER): Payer: Managed Care, Other (non HMO) | Admitting: Neurology

## 2015-10-21 VITALS — BP 130/88 | HR 80 | Ht 71.0 in | Wt 258.4 lb

## 2015-10-21 DIAGNOSIS — M5417 Radiculopathy, lumbosacral region: Secondary | ICD-10-CM

## 2015-10-21 DIAGNOSIS — R252 Cramp and spasm: Secondary | ICD-10-CM

## 2015-10-21 DIAGNOSIS — G609 Hereditary and idiopathic neuropathy, unspecified: Secondary | ICD-10-CM | POA: Diagnosis not present

## 2015-10-21 MED ORDER — METHOCARBAMOL 750 MG PO TABS: 750.0000 mg | ORAL_TABLET | Freq: Three times a day (TID) | ORAL | Status: DC | PRN

## 2015-10-21 NOTE — Patient Instructions (Addendum)
1.  Check blood work 2.  Continue robaxin 724m as needed for muscle spasms 3.  Stay well hydrated and start posterior leg stretches  4.  You can try exercises such as tai chi, yoga, or pilates to improve balance  Return to clinic as needed

## 2015-10-21 NOTE — Progress Notes (Signed)
[facility] Clinic Note - Initial Visit   Date: 10/21/2015  Henry Figueroa MRN: [id number] DOB: 1955/10/26   Dear Dr. Ronnald Ramp:  Thank you for your kind referral of Henry Figueroa for consultation of bilateral leg discomfort. Although his history is well known to you, please allow [name] to reiterate it for the purpose of our medical record. The patient was accompanied to the clinic by self.    History of Present Illness: Henry Figueroa is a 60 y.o. right-handed Caucasian male with history of basal cell carcinoma of the back and hypertension presenting for evaluation of bilateral feet paresthesias.    Starting around ~2012, he began having cramping sensation of the left foot when he was running.  Slowly, he started noticing severe itching of the feet and constant numbness of the feet. He tries rubbing his feet which helps. Sometimes the itching can even wake him up from sleeping.  It occurs sporadically, a few times per month.   Numbness predominately affects the soles from the mid-foot to the toes.  He denies paresthesias of the dorsum of the foot.  Numbness is constant, no exacerbating or alleviating factors.  He is more careful when walking down steps and uses the hand rail, so feels that his balance is worse.  He had one fall, tripping over his cat.  He stumbles sometimes and feels his legs drag a little.  He reports to have 20+ year history of heavy alcohol abuse and went through rehab in the 1990s.  He has been sober since 1993.   He endorses mild low back pain. He gets occasional painful cramps of the feet.  Denies radicular leg pain.  NCS/EMG showed mild L5 >> S1 radiculopathy affecting the lower extremities bilaterally.  There was no evidence of a large fiber peripheral neuropathy.   Out-side paper records, electronic medical record, and images have been reviewed where available and summarized as:  Lab Results  Component Value Date   TSH 1.10 03/26/2015   Lab  Results  Component Value Date   HGBA1C 5.1 03/26/2015   Lab Results  Component Value Date   VITAMINB12 386 03/26/2015    NCS/EMG of the lower extremities 04/09/2015:   1. Chronic L5 radiculopathy affecting bilateral lower extremities, mild-to-moderate in degree electrically. 2. Chronic S1 radiculopathy affecting bilateral lower extremities, mild in degree electrically. 3. There is no evidence of a generalized sensorimotor polyneuropathy affecting the lower extremities.   Past Medical History  Diagnosis Date  . Hypertension   . Basal cell carcinoma     Back    Past Surgical History  Procedure Laterality Date  . Shoulder arthroscopy Left   . Knee surgery Right      Medications:  Outpatient Encounter Prescriptions as of 10/21/2015  Medication Sig  . diclofenac sodium (VOLTAREN) 1 % GEL Apply 4 g topically 4 (four) times daily.  Marland Kitchen HYDROcodone-acetaminophen (NORCO/VICODIN) 5-325 MG tablet Take 1 tablet by mouth every 6 (six) hours as needed for moderate pain.  . meloxicam (MOBIC) 15 MG tablet Take 1 tablet (15 mg total) by mouth daily.  . methocarbamol (ROBAXIN-750) 750 MG tablet Take 1 tablet (750 mg total) by mouth every 8 (eight) hours as needed for muscle spasms.  . valsartan-hydrochlorothiazide (DIOVAN-HCT) 320-25 MG per tablet Take 1 tablet by mouth daily.  . [DISCONTINUED] methocarbamol (ROBAXIN-750) 750 MG tablet Take 1 tablet (750 mg total) by mouth every 8 (eight) hours as needed for muscle spasms.   No facility-administered encounter medications on file as  of 10/21/2015.     Allergies: Not on File  Family History: Family History  Problem Relation Age of Onset  . Alcohol abuse Father   . Cancer Neg Hx   . Depression Neg Hx   . Diabetes Neg Hx   . Drug abuse Neg Hx   . Early death Neg Hx   . Heart disease Neg Hx   . Hyperlipidemia Neg Hx   . Hypertension Neg Hx   . Kidney disease Neg Hx   . Stroke Neg Hx   . Dementia Father     Social History: Social  History  Substance Use Topics  . Smoking status: Former Research scientist (life sciences)  . Smokeless tobacco: Never Used  . Alcohol Use: No   Social History   Social History Narrative   Lives alone in a 2 story home.  Has no children.     Works as a Hotel manager.     Education: college degree.    Review of Systems:  CONSTITUTIONAL: No fevers, chills, night sweats, or weight loss.   EYES: No visual changes or eye pain ENT: No hearing changes.  No history of nose bleeds.   RESPIRATORY: No cough, wheezing and shortness of breath.   CARDIOVASCULAR: Negative for chest pain, and palpitations.   GI: Negative for abdominal discomfort, blood in stools or black stools.  No recent change in bowel habits.   GU:  No history of incontinence.   MUSCLOSKELETAL: No history of joint pain or swelling.  No myalgias.   SKIN: Negative for lesions, rash, and itching.   HEMATOLOGY/ONCOLOGY: Negative for prolonged bleeding, bruising easily, and swollen nodes.  +history of cancer.   ENDOCRINE: Negative for cold or heat intolerance, polydipsia or goiter.   PSYCH:  No depression or anxiety symptoms.   NEURO: As Above.   Vital Signs:  BP 130/88 mmHg  Pulse 80  Ht '5\' 11"'  (1.803 m)  Wt 258 lb 7 oz (117.226 kg)  BMI 36.06 kg/m2  SpO2 96%   General Medical Exam:   General:  Well appearing, comfortable.   Eyes/ENT: see cranial nerve examination.   Neck: No masses appreciated.  Full range of motion without tenderness.  No carotid bruits. Respiratory:  Clear to auscultation, good air entry bilaterally.   Cardiac:  Regular rate and rhythm, no murmur.   Extremities:  No deformities, edema, or skin discoloration.  Skin:  No rashes or lesions.  Neurological Exam: MENTAL STATUS including orientation to time, place, person, recent and remote memory, attention span and concentration, language, and fund of knowledge is normal.  Speech is not dysarthric.  CRANIAL NERVES: II:  No visual field defects.  Unremarkable fundi.   III-IV-VI:  Pupils equal round and reactive to light.  Normal conjugate, extra-ocular eye movements in all directions of gaze.  No nystagmus.  No ptosis.   V:  Normal facial sensation.     VII:  Normal facial symmetry and movements.   VIII:  Normal hearing and vestibular function.   IX-X:  Normal palatal movement.   XI:  Normal shoulder shrug and head rotation.   XII:  Normal tongue strength and range of motion, no deviation or fasciculation.  MOTOR:  No atrophy, fasciculations or abnormal movements.  No pronator drift.  Tone is normal.    Right Upper Extremity:    Left Upper Extremity:    Deltoid  5/5   Deltoid  5/5   Biceps  5/5   Biceps  5/5   Triceps  5/5   Triceps  5/5   Wrist extensors  5/5   Wrist extensors  5/5   Wrist flexors  5/5   Wrist flexors  5/5   Finger extensors  5/5   Finger extensors  5/5   Finger flexors  5/5   Finger flexors  5/5   Dorsal interossei  5/5   Dorsal interossei  5/5   Abductor pollicis  5/5   Abductor pollicis  5/5   Tone (Ashworth scale)  0  Tone (Ashworth scale)  0   Right Lower Extremity:    Left Lower Extremity:    Hip flexors  5/5   Hip flexors  5/5   Hip extensors  5/5   Hip extensors  5/5   Knee flexors  5/5   Knee flexors  5/5   Knee extensors  5/5   Knee extensors  5/5   Dorsiflexors  5/5   Dorsiflexors  5/5   Plantarflexors  5/5   Plantarflexors  5/5   Toe extensors  5/5   Toe extensors  5/5   Toe flexors  4/5   Toe flexors  4/5   Tone (Ashworth scale)  0  Tone (Ashworth scale)  0   MSRs:  Right                                                                 Left brachioradialis 2+  brachioradialis 2+  biceps 2+  biceps 2+  triceps 2+  triceps 2+  patellar 2+  patellar 2+  ankle jerk 1+  ankle jerk 1+  Hoffman no  Hoffman no  plantar response down  plantar response down   SENSORY:  Reduced vibration, temperature, pin prick over the dorsum of the feet bilaterally.  Sensation intact to all modalities in the upper extremities.  There is mild  sway with Romberg's testing.   COORDINATION/GAIT: Normal finger-to- nose-finger and heel-to-shin.  Intact rapid alternating movements bilaterally.  Able to rise from a chair without using arms.  Gait narrow based and stable. Tandem and stressed gait intact.    IMPRESSION: Henry Figueroa is a 60 year-old gentleman referred for evaluation of distal feet paresthesias. I reviewed his NCS/EMG from September 2016 which did not show evidence of neuropathy, but did demonstrate bilateral L5-S1 radiculopathy.  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  The reason for his NCS to show normal responses is because symptoms are too distal and mild to be electrically recorded.   Although L5-S1 lumbosacral radiculopathy can manifest with similar symptoms, the lack of back pain and radicular pain suggests this is more of a peripheral nerve pathology.  He has remote history of 20+ years of alcohol abuse and has been sober since 1993.  The neurotoxic effects that certainly predispose him to neuropathy later in life.  Labs including HbA1c, TSH, and vitamin B12 is within normal limits.  I will check a few additional labs for secondary causes of neuropathy.  I had extensive discussion with the patient regarding the pathogenesis, etiology, management, and natural course of neuropathy. 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    I suspect his muscle cramps are due to lumbosacral stenosis.  He  is getting adequate relief with robaxin which will be continued. If his pain worsens or he develops weakness, proceed with MRI lumbar spine.   PLAN/RECOMMENDATIONS:  1.  Check vitamin B1, copper, SPEP with IFE 2.  Continue robaxin 730m q8h prn muscle spasms 3.  Recommend posterior leg stretches  4.  Balance training exercises such as tai chi, yoga, and pilates  recommended  Return to clinic as needed  The duration of this appointment visit was 45 minutes of face-to-face time with the patient.  Greater than 50% of this time was spent in counseling, explanation of diagnosis, planning of further management, and coordination of care.   Thank you for allowing me to participate in patient's care.  If I can answer any additional questions, I would be pleased to do so.    Sincerely,    Donika K. PPosey Pronto DO

## 2015-10-23 LAB — PROTEIN ELECTROPHORESIS, SERUM
ALBUMIN ELP: 4.3 g/dL (ref 3.8–4.8)
ALPHA-1-GLOBULIN: 0.3 g/dL (ref 0.2–0.3)
Alpha-2-Globulin: 0.5 g/dL (ref 0.5–0.9)
BETA 2: 0.3 g/dL (ref 0.2–0.5)
Beta Globulin: 0.4 g/dL (ref 0.4–0.6)
Gamma Globulin: 1 g/dL (ref 0.8–1.7)
TOTAL PROTEIN, SERUM ELECTROPHOR: 6.8 g/dL (ref 6.1–8.1)

## 2015-10-23 LAB — IMMUNOFIXATION ELECTROPHORESIS
IGG (IMMUNOGLOBIN G), SERUM: 991 mg/dL (ref 650–1600)
IGM, SERUM: 100 mg/dL (ref 41–251)
IgA: 253 mg/dL (ref 68–379)

## 2015-10-25 LAB — COPPER, SERUM: COPPER: 81 ug/dL (ref 72–166)

## 2015-10-26 LAB — VITAMIN B1: Vitamin B1 (Thiamine): 14 nmol/L (ref 8–30)

## 2015-10-28 ENCOUNTER — Encounter: Payer: Self-pay | Admitting: *Deleted

## 2015-10-28 ENCOUNTER — Other Ambulatory Visit: Payer: Self-pay | Admitting: Internal Medicine

## 2015-10-28 DIAGNOSIS — M5416 Radiculopathy, lumbar region: Secondary | ICD-10-CM

## 2015-10-28 DIAGNOSIS — M7062 Trochanteric bursitis, left hip: Secondary | ICD-10-CM

## 2015-10-28 MED ORDER — MELOXICAM 15 MG PO TABS: 15.0000 mg | ORAL_TABLET | Freq: Every day | ORAL | Status: DC

## 2015-11-24 ENCOUNTER — Other Ambulatory Visit: Payer: Self-pay | Admitting: Internal Medicine

## 2015-12-18 ENCOUNTER — Other Ambulatory Visit: Payer: Self-pay | Admitting: *Deleted

## 2015-12-18 MED ORDER — VALSARTAN-HYDROCHLOROTHIAZIDE 320-25 MG PO TABS: 1.0000 | ORAL_TABLET | Freq: Every day | ORAL | Status: DC

## 2016-03-12 ENCOUNTER — Other Ambulatory Visit: Payer: Self-pay | Admitting: *Deleted

## 2016-03-12 DIAGNOSIS — M7062 Trochanteric bursitis, left hip: Secondary | ICD-10-CM

## 2016-03-12 DIAGNOSIS — M5416 Radiculopathy, lumbar region: Secondary | ICD-10-CM

## 2016-03-12 MED ORDER — MELOXICAM 15 MG PO TABS: 15.0000 mg | ORAL_TABLET | Freq: Every day | ORAL | 1 refills | Status: DC

## 2016-03-16 ENCOUNTER — Other Ambulatory Visit: Payer: Self-pay | Admitting: Internal Medicine

## 2016-08-19 ENCOUNTER — Telehealth: Payer: Self-pay | Admitting: Emergency Medicine

## 2016-08-19 NOTE — Telephone Encounter (Signed)
noted 

## 2016-08-19 NOTE — Telephone Encounter (Signed)
Spoke to patient about making an appt to fill out paperwork for Dr Ron Parker. Pt will call back to make appt. Thanks.

## 2016-09-04 ENCOUNTER — Other Ambulatory Visit: Payer: Self-pay | Admitting: Internal Medicine

## 2016-09-06 ENCOUNTER — Other Ambulatory Visit: Payer: Self-pay | Admitting: Internal Medicine

## 2016-09-06 DIAGNOSIS — M7062 Trochanteric bursitis, left hip: Secondary | ICD-10-CM

## 2016-09-06 DIAGNOSIS — M5416 Radiculopathy, lumbar region: Secondary | ICD-10-CM

## 2016-10-01 ENCOUNTER — Other Ambulatory Visit: Payer: Self-pay | Admitting: Internal Medicine

## 2016-10-15 ENCOUNTER — Telehealth: Payer: Self-pay | Admitting: *Deleted

## 2016-10-15 MED ORDER — VALSARTAN-HYDROCHLOROTHIAZIDE 320-25 MG PO TABS: ORAL_TABLET | ORAL | 0 refills | Status: DC

## 2016-10-15 NOTE — Telephone Encounter (Signed)
Rec'd call pt states he has 1 pill left on his Valsartan, and his appt not until Monday 3/26 requesting a week supply to be sent to [facility] until his appt. Infrom pt will send 5n day script until appt.Marland KitchenJohny Chess

## 2016-10-19 ENCOUNTER — Ambulatory Visit (INDEPENDENT_AMBULATORY_CARE_PROVIDER_SITE_OTHER): Payer: Managed Care, Other (non HMO) | Admitting: Internal Medicine

## 2016-10-19 ENCOUNTER — Encounter: Payer: Self-pay | Admitting: Internal Medicine

## 2016-10-19 ENCOUNTER — Other Ambulatory Visit (INDEPENDENT_AMBULATORY_CARE_PROVIDER_SITE_OTHER): Payer: Managed Care, Other (non HMO)

## 2016-10-19 VITALS — BP 120/88 | HR 103 | Temp 98.1°F | Ht 71.0 in | Wt 249.0 lb

## 2016-10-19 DIAGNOSIS — Z0000 Encounter for general adult medical examination without abnormal findings: Secondary | ICD-10-CM

## 2016-10-19 DIAGNOSIS — I10 Essential (primary) hypertension: Secondary | ICD-10-CM | POA: Diagnosis not present

## 2016-10-19 DIAGNOSIS — R7989 Other specified abnormal findings of blood chemistry: Secondary | ICD-10-CM | POA: Diagnosis not present

## 2016-10-19 LAB — COMPREHENSIVE METABOLIC PANEL
ALBUMIN: 4.8 g/dL (ref 3.5–5.2)
ALK PHOS: 42 U/L (ref 39–117)
ALT: 32 U/L (ref 0–53)
AST: 21 U/L (ref 0–37)
BILIRUBIN TOTAL: 0.4 mg/dL (ref 0.2–1.2)
BUN: 23 mg/dL (ref 6–23)
CO2: 31 mEq/L (ref 19–32)
CREATININE: 1.32 mg/dL (ref 0.40–1.50)
Calcium: 9.8 mg/dL (ref 8.4–10.5)
Chloride: 103 mEq/L (ref 96–112)
GFR: 58.41 mL/min — ABNORMAL LOW (ref >60.00)
Glucose, Bld: 104 mg/dL — ABNORMAL HIGH (ref 70–99)
Potassium: 4.3 mEq/L (ref 3.5–5.1)
SODIUM: 141 meq/L (ref 135–145)
TOTAL PROTEIN: 7.3 g/dL (ref 6.0–8.3)

## 2016-10-19 LAB — CBC WITH DIFFERENTIAL/PLATELET
Basophils Absolute: 0.1 10*3/uL (ref 0.0–0.1)
Basophils Relative: 1 % (ref 0.0–3.0)
EOS ABS: 0.2 10*3/uL (ref 0.0–0.7)
Eosinophils Relative: 2.3 % (ref 0.0–5.0)
HCT: 42.3 % (ref 39.0–52.0)
HEMOGLOBIN: 14.6 g/dL (ref 13.0–17.0)
Lymphocytes Relative: 30.6 % (ref 12.0–46.0)
Lymphs Abs: 2.2 10*3/uL (ref 0.7–4.0)
MCHC: 34.4 g/dL (ref 30.0–36.0)
MCV: 93.5 fl (ref 78.0–100.0)
MONO ABS: 0.6 10*3/uL (ref 0.1–1.0)
Monocytes Relative: 9.2 % (ref 3.0–12.0)
Neutro Abs: 4 10*3/uL (ref 1.4–7.7)
Neutrophils Relative %: 56.9 % (ref 43.0–77.0)
Platelets: 238 10*3/uL (ref 150.0–400.0)
RBC: 4.53 Mil/uL (ref 4.22–5.81)
RDW: 13.5 % (ref 11.5–15.5)
WBC: 7.1 10*3/uL (ref 4.0–10.5)

## 2016-10-19 LAB — PSA: PSA: 1.63 ng/mL (ref 0.10–4.00)

## 2016-10-19 LAB — LIPID PANEL
CHOLESTEROL: 202 mg/dL — AB (ref 0–200)
HDL: 56 mg/dL (ref >39.00)
Total CHOL/HDL Ratio: 4
Triglycerides: 429 mg/dL — ABNORMAL HIGH (ref 0.0–149.0)

## 2016-10-19 LAB — TSH: TSH: 1.09 u[IU]/mL (ref 0.35–4.50)

## 2016-10-19 LAB — HEMOGLOBIN A1C: HEMOGLOBIN A1C: 5.4 % (ref 4.6–6.5)

## 2016-10-19 MED ORDER — VALSARTAN-HYDROCHLOROTHIAZIDE 320-25 MG PO TABS: ORAL_TABLET | ORAL | 1 refills | Status: DC

## 2016-10-19 NOTE — Patient Instructions (Signed)
 Health Maintenance, Male A healthy lifestyle and preventive care is important for your health and wellness. Ask your health care provider about what schedule of regular examinations is right for you. What should I know about weight and diet?  Eat a Healthy Diet  Eat plenty of vegetables, fruits, whole grains, low-fat dairy products, and lean protein.  Do not eat a lot of foods high in solid fats, added sugars, or salt. Maintain a Healthy Weight  Regular exercise can help you achieve or maintain a healthy weight. You should:  Do at least 150 minutes of exercise each week. The exercise should increase your heart rate and make you sweat (moderate-intensity exercise).  Do strength-training exercises at least twice a week. Watch Your Levels of Cholesterol and Blood Lipids  Have your blood tested for lipids and cholesterol every 5 years starting at 62 years of age. If you are at high risk for heart disease, you should start having your blood tested when you are 62 years old. You may need to have your cholesterol levels checked more often if:  Your lipid or cholesterol levels are high.  You are older than 62 years of age.  You are at high risk for heart disease. What should I know about cancer screening? Many types of cancers can be detected early and may often be prevented. Lung Cancer  You should be screened every year for lung cancer if:  You are a current smoker who has smoked for at least 30 years.  You are a former smoker who has quit within the past 15 years.  Talk to your health care provider about your screening options, when you should start screening, and how often you should be screened. Colorectal Cancer  Routine colorectal cancer screening usually begins at 62 years of age and should be repeated every 5-10 years until you are 62 years old. You may need to be screened more often if early forms of precancerous polyps or small growths are found. Your health care provider  may recommend screening at an earlier age if you have risk factors for colon cancer.  Your health care provider may recommend using home test kits to check for hidden blood in the stool.  A small camera at the end of a tube can be used to examine your colon (sigmoidoscopy or colonoscopy). This checks for the earliest forms of colorectal cancer. Prostate and Testicular Cancer  Depending on your age and overall health, your health care provider may do certain tests to screen for prostate and testicular cancer.  Talk to your health care provider about any symptoms or concerns you have about testicular or prostate cancer. Skin Cancer  Check your skin from head to toe regularly.  Tell your health care provider about any new moles or changes in moles, especially if:  There is a change in a mole's size, shape, or color.  You have a mole that is larger than a pencil eraser.  Always use sunscreen. Apply sunscreen liberally and repeat throughout the day.  Protect yourself by wearing long sleeves, pants, a wide-brimmed hat, and sunglasses when outside. What should I know about heart disease, diabetes, and high blood pressure?  If you are 18-39 years of age, have your blood pressure checked every 3-5 years. If you are 40 years of age or older, have your blood pressure checked every year. You should have your blood pressure measured twice-once when you are at a hospital or clinic, and once when you are not at   a hospital or clinic. Record the average of the two measurements. To check your blood pressure when you are not at a hospital or clinic, you can use:  An automated blood pressure machine at a pharmacy.  A home blood pressure monitor.  Talk to your health care provider about your target blood pressure.  If you are between 45-79 years old, ask your health care provider if you should take aspirin to prevent heart disease.  Have regular diabetes screenings by checking your fasting blood sugar  level.  If you are at a normal weight and have a low risk for diabetes, have this test once every three years after the age of 45.  If you are overweight and have a high risk for diabetes, consider being tested at a younger age or more often.  A one-time screening for abdominal aortic aneurysm (AAA) by ultrasound is recommended for men aged 65-75 years who are current or former smokers. What should I know about preventing infection? Hepatitis B  If you have a higher risk for hepatitis B, you should be screened for this virus. Talk with your health care provider to find out if you are at risk for hepatitis B infection. Hepatitis C  Blood testing is recommended for:  Everyone born from 1945 through 1965.  Anyone with known risk factors for hepatitis C. Sexually Transmitted Diseases (STDs)  You should be screened each year for STDs including gonorrhea and chlamydia if:  You are sexually active and are younger than 62 years of age.  You are older than 62 years of age and your health care provider tells you that you are at risk for this type of infection.  Your sexual activity has changed since you were last screened and you are at an increased risk for chlamydia or gonorrhea. Ask your health care provider if you are at risk.  Talk with your health care provider about whether you are at high risk of being infected with HIV. Your health care provider may recommend a prescription medicine to help prevent HIV infection. What else can I do?  Schedule regular health, dental, and eye exams.  Stay current with your vaccines (immunizations).  Do not use any tobacco products, such as cigarettes, chewing tobacco, and e-cigarettes. If you need help quitting, ask your health care provider.  Limit alcohol intake to no more than 2 drinks per day. One drink equals 12 ounces of beer, 5 ounces of wine, or 1 ounces of hard liquor.  Do not use street drugs.  Do not share needles.  Ask your health  care provider for help if you need support or information about quitting drugs.  Tell your health care provider if you often feel depressed.  Tell your health care provider if you have ever been abused or do not feel safe at home. This information is not intended to replace advice given to you by your health care provider. Make sure you discuss any questions you have with your health care provider. Document Released: 01/09/2008 Document Revised: 03/11/2016 Document Reviewed: 04/16/2015 Elsevier Interactive Patient Education  2017 [employer]  

## 2016-10-19 NOTE — Progress Notes (Signed)
Pre visit review using our clinic review tool, if applicable. No additional management support is needed unless otherwise documented below in the visit note. 

## 2016-10-19 NOTE — Progress Notes (Signed)
Subjective:  Patient ID: Henry Figueroa, male    DOB: 1956-05-02  Age: 61 y.o. MRN: [id number]  CC: Annual Exam and Hypertension   HPI Henry Figueroa presents for a CPX. He tells me that his BP has been well controlled. He is active and has had no recent episodes of DOE, CP, SOB, fatigue, edema, or palpitations.  Outpatient Medications Prior to Visit  Medication Sig Dispense Refill  . valsartan-hydrochlorothiazide (DIOVAN-HCT) 320-25 MG tablet Take 1 tablet by mouth daily. Keep 10/19/16 appt for future refills 5 tablet 0  . diclofenac sodium (VOLTAREN) 1 % GEL APPLY 4 G TOPICALLY 4 (FOUR) TIMES DAILY. 100 g 0  . HYDROcodone-acetaminophen (NORCO/VICODIN) 5-325 MG tablet Take 1 tablet by mouth every 6 (six) hours as needed for moderate pain. 25 tablet 0  . meloxicam (MOBIC) 15 MG tablet TAKE 1 TABLET (15 MG TOTAL) BY MOUTH DAILY. 90 tablet 0  . methocarbamol (ROBAXIN-750) 750 MG tablet Take 1 tablet (750 mg total) by mouth every 8 (eight) hours as needed for muscle spasms. 20 tablet 3   No facility-administered medications prior to visit.     ROS Review of Systems  Constitutional: Negative.  Negative for activity change, appetite change, diaphoresis, fatigue and unexpected weight change.  HENT: Negative.  Negative for trouble swallowing.   Eyes: Negative for visual disturbance.  Respiratory: Negative.  Negative for cough, chest tightness, shortness of breath and wheezing.   Cardiovascular: Negative for chest pain, palpitations and leg swelling.  Gastrointestinal: Negative for abdominal pain, constipation, diarrhea, nausea and vomiting.  Endocrine: Negative.   Genitourinary: Negative.  Negative for difficulty urinating, dysuria, frequency, hematuria, penile swelling, scrotal swelling, testicular pain and urgency.  Musculoskeletal: Negative.  Negative for back pain, myalgias and neck pain.  Skin: Negative.   Allergic/Immunologic: Negative.   Neurological: Negative.  Negative for  dizziness, weakness, light-headedness and headaches.  Hematological: Negative for adenopathy. Does not bruise/bleed easily.  Psychiatric/Behavioral: Negative.     Objective:  BP 120/88 (BP Location: Left Arm, Patient Position: Sitting, Cuff Size: Normal)   Pulse (!) 103   Temp 98.1 F (36.7 C) (Oral)   Ht 5\' 11"  (1.803 m)   Wt 249 lb (112.9 kg)   SpO2 95%   BMI 34.73 kg/m   BP Readings from Last 3 Encounters:  10/19/16 120/88  10/21/15 130/88  09/14/15 130/84    Wt Readings from Last 3 Encounters:  10/19/16 249 lb (112.9 kg)  10/21/15 258 lb 7 oz (117.2 kg)  09/14/15 252 lb (114.3 kg)    Physical Exam  Constitutional: He is oriented to person, place, and time. No distress.  HENT:  Mouth/Throat: Oropharynx is clear and moist. No oropharyngeal exudate.  Eyes: Conjunctivae are normal. Right eye exhibits no discharge. Left eye exhibits no discharge. No scleral icterus.  Neck: Normal range of motion. Neck supple. No JVD present. No tracheal deviation present. No thyromegaly present.  Cardiovascular: Normal rate, regular rhythm, normal heart sounds and intact distal pulses.  Exam reveals no gallop and no friction rub.   No murmur heard. EKG ---  Sinus  Rhythm  -Incomplete right bundle branch block.   ABNORMAL   Pulmonary/Chest: Effort normal and breath sounds normal. No stridor. No respiratory distress. He has no wheezes. He has no rales. He exhibits no tenderness.  Abdominal: Soft. Bowel sounds are normal. He exhibits no distension and no mass. There is no tenderness. There is no rebound and no guarding. Hernia confirmed negative in the right inguinal area and  confirmed negative in the left inguinal area.  Genitourinary: Rectum normal, testes normal and penis normal. Rectal exam shows no external hemorrhoid, no internal hemorrhoid, no fissure, no mass, no tenderness, anal tone normal and guaiac negative stool. Prostate is not enlarged and not tender. Right testis shows no mass,  no swelling and no tenderness. Right testis is descended. Left testis shows no mass, no swelling and no tenderness. Left testis is descended. Circumcised. No penile erythema or penile tenderness. No discharge found.  Musculoskeletal: Normal range of motion. He exhibits no edema, tenderness or deformity.  Lymphadenopathy:    He has no cervical adenopathy.       Right: No inguinal adenopathy present.       Left: No inguinal adenopathy present.  Neurological: He is oriented to person, place, and time.  Skin: Skin is warm and dry. No rash noted. He is not diaphoretic. No erythema. No pallor.  Psychiatric: He has a normal mood and affect. His behavior is normal. Judgment and thought content normal.  Vitals reviewed.   Lab Results  Component Value Date   WBC 6.6 03/26/2015   HGB 15.4 03/26/2015   HCT 44.9 03/26/2015   PLT 240.0 03/26/2015   GLUCOSE 103 (H) 03/26/2015   CHOL 201 (H) 10/29/2014   TRIG 311.0 (H) 10/29/2014   HDL 55.10 10/29/2014   LDLDIRECT 104.0 10/29/2014   ALT 38 10/29/2014   AST 25 10/29/2014   NA 142 03/26/2015   K 3.9 03/26/2015   CL 102 03/26/2015   CREATININE 1.34 03/26/2015   BUN 22 03/26/2015   CO2 31 03/26/2015   TSH 1.10 03/26/2015   HGBA1C 5.1 03/26/2015    Patient was never admitted.  Assessment & Plan:   Henry Figueroa was seen today for annual exam and hypertension.  Diagnoses and all orders for this visit:  Routine general medical examination at a health care facility- exam completed, labs ordered and reviewed, he refused a flu vaccine, his colonoscopy is up-to-date, patient education material was given -     Lipid panel; Future -     Comprehensive metabolic panel; Future -     CBC with Differential/Platelet; Future -     TSH; Future -     Hemoglobin A1c; Future -     Hepatitis C antibody; Future -     PSA; Future -     EKG 12-Lead  Essential hypertension, benign- his EKG is negative for LVH, he does have right bundle branch block which is a  benign finding, he is asymptomatic and has excellent blood pressure control with normal electrolytes. Will continue the combination of an ARB plus a thiazide diuretic. -     valsartan-hydrochlorothiazide (DIOVAN-HCT) 320-25 MG tablet; Take 1 tablet by mouth daily. Keep 10/19/16 appt for future refills   I have discontinued Henry Figueroa's HYDROcodone-acetaminophen, methocarbamol, diclofenac sodium, and meloxicam. I am also having him maintain his valsartan-hydrochlorothiazide.  Meds ordered this encounter  Medications  . valsartan-hydrochlorothiazide (DIOVAN-HCT) 320-25 MG tablet    Sig: Take 1 tablet by mouth daily. Keep 10/19/16 appt for future refills    Dispense:  90 tablet    Refill:  1     Follow-up: Return in about 6 months (around 04/21/2017).  Scarlette Calico, MD

## 2016-10-20 ENCOUNTER — Encounter: Payer: Self-pay | Admitting: Internal Medicine

## 2016-10-20 LAB — LDL CHOLESTEROL, DIRECT: LDL DIRECT: 89 mg/dL

## 2016-10-20 LAB — HEPATITIS C ANTIBODY: HCV Ab: NEGATIVE

## 2016-12-08 ENCOUNTER — Other Ambulatory Visit: Payer: Self-pay | Admitting: Internal Medicine

## 2016-12-08 DIAGNOSIS — M7062 Trochanteric bursitis, left hip: Secondary | ICD-10-CM

## 2016-12-08 DIAGNOSIS — M5416 Radiculopathy, lumbar region: Secondary | ICD-10-CM

## 2016-12-08 NOTE — Telephone Encounter (Signed)
Done erx 

## 2016-12-08 NOTE — Telephone Encounter (Signed)
Please advise in PCP absence.  

## 2017-04-15 ENCOUNTER — Other Ambulatory Visit: Payer: Self-pay | Admitting: Internal Medicine

## 2017-04-15 DIAGNOSIS — I10 Essential (primary) hypertension: Secondary | ICD-10-CM

## 2017-07-23 ENCOUNTER — Other Ambulatory Visit: Payer: Self-pay | Admitting: Internal Medicine

## 2017-07-23 DIAGNOSIS — I10 Essential (primary) hypertension: Secondary | ICD-10-CM

## 2017-07-27 DIAGNOSIS — I4891 Unspecified atrial fibrillation: Secondary | ICD-10-CM

## 2017-07-27 HISTORY — DX: Unspecified atrial fibrillation: I48.91

## 2017-09-06 ENCOUNTER — Other Ambulatory Visit: Payer: Self-pay | Admitting: Internal Medicine

## 2017-09-06 DIAGNOSIS — M7062 Trochanteric bursitis, left hip: Secondary | ICD-10-CM

## 2017-09-06 DIAGNOSIS — M5416 Radiculopathy, lumbar region: Secondary | ICD-10-CM

## 2017-09-07 ENCOUNTER — Encounter: Payer: Self-pay | Admitting: Family Medicine

## 2017-09-07 ENCOUNTER — Ambulatory Visit (INDEPENDENT_AMBULATORY_CARE_PROVIDER_SITE_OTHER): Payer: Managed Care, Other (non HMO) | Admitting: Family Medicine

## 2017-09-07 DIAGNOSIS — R059 Cough, unspecified: Secondary | ICD-10-CM

## 2017-09-07 DIAGNOSIS — R05 Cough: Secondary | ICD-10-CM | POA: Diagnosis not present

## 2017-09-07 NOTE — Progress Notes (Signed)
Henry Figueroa - 62 y.o. male MRN [id number]  Date of birth: [birth year]  SUBJECTIVE:  Including CC & ROS.  Chief Complaint  Patient presents with  . Cough    Henry Figueroa is a 62 y.o. male that is presenting with a cough. Ongoing for five days. Admits body aches and fevers. Chest pain due to coughing. He has been taking Mucinex DM with some improvement. He does not get flu shots. He feels like today his symptoms have been the best that they have been. Symptoms like his symptoms are worse during the day. Has been around for people in public but no specific sick contacts. Denies any asthma or smoking history. Nonbloody sputum.   Review of Systems  Constitutional: Negative for fever.  HENT: Negative for sinus pain.   Respiratory: Positive for choking.   Cardiovascular: Negative for chest pain.  Gastrointestinal: Negative for abdominal pain.  Musculoskeletal: Negative for back pain.  Skin: Negative for color change.  Neurological: Negative for weakness.  Hematological: Negative for adenopathy.  Psychiatric/Behavioral: Negative for agitation.    HISTORY: Past Medical, Surgical, Social, and Family History Reviewed & Updated per EMR.   Pertinent Historical Findings include:  Past Medical History:  Diagnosis Date  . Basal cell carcinoma    Back  . Hypertension     Past Surgical History:  Procedure Laterality Date  . KNEE SURGERY Right   . SHOULDER ARTHROSCOPY Left     Not on File  Family History  Problem Relation Age of Onset  . Alcohol abuse Father   . Cancer Neg Hx   . Depression Neg Hx   . Diabetes Neg Hx   . Drug abuse Neg Hx   . Early death Neg Hx   . Heart disease Neg Hx   . Hyperlipidemia Neg Hx   . Hypertension Neg Hx   . Kidney disease Neg Hx   . Stroke Neg Hx   . Dementia Father      Social History   Socioeconomic History  . Marital status: Single    Spouse name: Not on file  . Number of children: Not on file  . Years of education: Not on file  .  Highest education level: Not on file  Social Needs  . Financial resource strain: Not on file  . Food insecurity - worry: Not on file  . Food insecurity - inability: Not on file  . Transportation needs - medical: Not on file  . Transportation needs - non-medical: Not on file  Occupational History  . Not on file  Tobacco Use  . Smoking status: Former Research scientist (life sciences)  . Smokeless tobacco: Never Used  Substance and Sexual Activity  . Alcohol use: No    Alcohol/week: 0.0 oz  . Drug use: No  . Sexual activity: Not Currently  Other Topics Concern  . Not on file  Social History Narrative   Lives alone in a 2 story home.  Has no children.     Works as a Hotel manager.     Education: college degree.     PHYSICAL EXAM:  VS: BP (!) 168/98 (BP Location: Left Arm, Patient Position: Sitting, Cuff Size: Normal)   Pulse 83   Temp 98.4 F (36.9 C) (Oral)   Ht 5\' 11"  (1.803 m)   Wt 242 lb (109.8 kg)   SpO2 95%   BMI 33.75 kg/m  Physical Exam Gen: NAD, alert, cooperative with exam, ENT: normal lips, normal nasal mucosa, tympanic membranes clear and intact bilaterally, normal oropharynx, no  cervical lymphadenopathy Eye: normal EOM, normal conjunctiva and lids CV:  no edema, +2 pedal pulses, regular rate and rhythm, S1-S2   Resp: no accessory muscle use, non-labored, clear to auscultation bilaterally, no crackles or wheezes Skin: no rashes, no areas of induration  Neuro: normal tone, normal sensation to touch Psych:  normal insight, alert and oriented MSK: Normal gait, normal strength       ASSESSMENT & PLAN:   Cough Seems to be viral in nature. He feels that is improving today. - Counseled on supportive care - Advised to call back if no improvement after a week.

## 2017-09-07 NOTE — Patient Instructions (Signed)
Honey can help with a sore throat.  Delsym can work for a cough. Vick's can help as well. Please call me back in a week her symptoms are not improved.

## 2017-09-07 NOTE — Assessment & Plan Note (Signed)
Seems to be viral in nature. He feels that is improving today. - Counseled on supportive care - Advised to call back if no improvement after a week.

## 2017-10-21 ENCOUNTER — Other Ambulatory Visit: Payer: Self-pay | Admitting: Internal Medicine

## 2017-10-21 ENCOUNTER — Telehealth: Payer: Self-pay | Admitting: Internal Medicine

## 2017-10-21 DIAGNOSIS — I10 Essential (primary) hypertension: Secondary | ICD-10-CM

## 2017-10-21 NOTE — Telephone Encounter (Signed)
Copied from [place] [id number]. Topic: Quick Communication - See Telephone Encounter >> Oct 21, 2017  9:39 AM Conception Chancy, NT wrote: CRM for notification. See Telephone encounter for: 10/21/17.  Patient is calling and states he requested a refill on valsartan-hydrochlorothiazide (DIOVAN-HCT) 320-25 MG tablet  and it was denied and would like to know why. Please advise.   [id number] - [place], [place] [address]  [place]. [place] [address]  Phone: [phone] Fax: [phone]

## 2017-10-21 NOTE — Telephone Encounter (Signed)
LVM for patient to set up his CPE, once appt is made we can send in his meds

## 2017-10-25 ENCOUNTER — Other Ambulatory Visit: Payer: Self-pay | Admitting: Internal Medicine

## 2017-10-25 DIAGNOSIS — I10 Essential (primary) hypertension: Secondary | ICD-10-CM

## 2017-10-29 ENCOUNTER — Ambulatory Visit (INDEPENDENT_AMBULATORY_CARE_PROVIDER_SITE_OTHER): Payer: Managed Care, Other (non HMO) | Admitting: Internal Medicine

## 2017-10-29 ENCOUNTER — Other Ambulatory Visit (INDEPENDENT_AMBULATORY_CARE_PROVIDER_SITE_OTHER): Payer: Managed Care, Other (non HMO)

## 2017-10-29 ENCOUNTER — Encounter: Payer: Self-pay | Admitting: Internal Medicine

## 2017-10-29 VITALS — BP 180/106 | HR 86 | Temp 98.7°F | Resp 16 | Ht 71.0 in | Wt 242.0 lb

## 2017-10-29 DIAGNOSIS — E559 Vitamin D deficiency, unspecified: Secondary | ICD-10-CM | POA: Diagnosis not present

## 2017-10-29 DIAGNOSIS — E785 Hyperlipidemia, unspecified: Secondary | ICD-10-CM

## 2017-10-29 DIAGNOSIS — M21612 Bunion of left foot: Secondary | ICD-10-CM

## 2017-10-29 DIAGNOSIS — R195 Other fecal abnormalities: Secondary | ICD-10-CM | POA: Diagnosis not present

## 2017-10-29 DIAGNOSIS — I10 Essential (primary) hypertension: Secondary | ICD-10-CM

## 2017-10-29 DIAGNOSIS — G4730 Sleep apnea, unspecified: Secondary | ICD-10-CM | POA: Insufficient documentation

## 2017-10-29 DIAGNOSIS — R0609 Other forms of dyspnea: Secondary | ICD-10-CM

## 2017-10-29 DIAGNOSIS — J301 Allergic rhinitis due to pollen: Secondary | ICD-10-CM | POA: Insufficient documentation

## 2017-10-29 DIAGNOSIS — Z0000 Encounter for general adult medical examination without abnormal findings: Secondary | ICD-10-CM

## 2017-10-29 LAB — CBC WITH DIFFERENTIAL/PLATELET
BASOS ABS: 0.1 10*3/uL (ref 0.0–0.1)
Basophils Relative: 0.7 % (ref 0.0–3.0)
EOS ABS: 0.2 10*3/uL (ref 0.0–0.7)
Eosinophils Relative: 2.3 % (ref 0.0–5.0)
HCT: 45.5 % (ref 39.0–52.0)
Hemoglobin: 15.6 g/dL (ref 13.0–17.0)
LYMPHS ABS: 2.2 10*3/uL (ref 0.7–4.0)
Lymphocytes Relative: 28.2 % (ref 12.0–46.0)
MCHC: 34.2 g/dL (ref 30.0–36.0)
MCV: 93.8 fl (ref 78.0–100.0)
MONO ABS: 0.7 10*3/uL (ref 0.1–1.0)
MONOS PCT: 8.4 % (ref 3.0–12.0)
NEUTROS ABS: 4.8 10*3/uL (ref 1.4–7.7)
NEUTROS PCT: 60.4 % (ref 43.0–77.0)
PLATELETS: 212 10*3/uL (ref 150.0–400.0)
RBC: 4.85 Mil/uL (ref 4.22–5.81)
RDW: 13.7 % (ref 11.5–15.5)
WBC: 7.9 10*3/uL (ref 4.0–10.5)

## 2017-10-29 LAB — URINALYSIS, ROUTINE W REFLEX MICROSCOPIC
Bilirubin Urine: NEGATIVE
HGB URINE DIPSTICK: NEGATIVE
Ketones, ur: NEGATIVE
LEUKOCYTES UA: NEGATIVE
NITRITE: NEGATIVE
PH: 6 (ref 5.0–8.0)
Specific Gravity, Urine: >=1.03 — AB (ref 1.000–1.030)
URINE GLUCOSE: NEGATIVE
Urobilinogen, UA: 0.2 (ref 0.0–1.0)

## 2017-10-29 LAB — LIPID PANEL
CHOL/HDL RATIO: 2
Cholesterol: 168 mg/dL (ref 0–200)
HDL: 72 mg/dL (ref >39.00)
LDL CALC: 74 mg/dL (ref 0–99)
NONHDL: 96.37
TRIGLYCERIDES: 110 mg/dL (ref 0.0–149.0)
VLDL: 22 mg/dL (ref 0.0–40.0)

## 2017-10-29 LAB — COMPREHENSIVE METABOLIC PANEL
ALK PHOS: 47 U/L (ref 39–117)
ALT: 16 U/L (ref 0–53)
AST: 18 U/L (ref 0–37)
Albumin: 4.4 g/dL (ref 3.5–5.2)
BILIRUBIN TOTAL: 0.7 mg/dL (ref 0.2–1.2)
BUN: 20 mg/dL (ref 6–23)
CO2: 32 meq/L (ref 19–32)
CREATININE: 0.98 mg/dL (ref 0.40–1.50)
Calcium: 9.5 mg/dL (ref 8.4–10.5)
Chloride: 100 mEq/L (ref 96–112)
GFR: 82.09 mL/min (ref >60.00)
GLUCOSE: 86 mg/dL (ref 70–99)
Potassium: 4 mEq/L (ref 3.5–5.1)
SODIUM: 140 meq/L (ref 135–145)
TOTAL PROTEIN: 7.2 g/dL (ref 6.0–8.3)

## 2017-10-29 LAB — VITAMIN D 25 HYDROXY (VIT D DEFICIENCY, FRACTURES): VITD: 12.18 ng/mL — ABNORMAL LOW (ref 30.00–100.00)

## 2017-10-29 LAB — TSH: TSH: 0.74 u[IU]/mL (ref 0.35–4.50)

## 2017-10-29 LAB — PSA: PSA: 1.95 ng/mL (ref 0.10–4.00)

## 2017-10-29 MED ORDER — NEBIVOLOL HCL 5 MG PO TABS: 5.0000 mg | ORAL_TABLET | Freq: Every day | ORAL | 0 refills | Status: DC

## 2017-10-29 MED ORDER — VALSARTAN-HYDROCHLOROTHIAZIDE 320-25 MG PO TABS: 1.0000 | ORAL_TABLET | Freq: Every day | ORAL | 0 refills | Status: DC

## 2017-10-29 MED ORDER — LEVOCETIRIZINE DIHYDROCHLORIDE 5 MG PO TABS: 5.0000 mg | ORAL_TABLET | Freq: Every evening | ORAL | 1 refills | Status: DC

## 2017-10-29 NOTE — Patient Instructions (Signed)

## 2017-10-29 NOTE — Progress Notes (Addendum)
Subjective:  Patient ID: Henry Figueroa, male    DOB: 09/08/1955  Age: 62 y.o. MRN: [id number]  CC: Hypertension and Annual Exam   HPI Quran Vasco presents for a CPX.  He does not know if his blood pressure has been well controlled as he does not monitor it.  He tells me he is compliant with the ARB and thiazide diuretic.  He is concerned about his snoring and he thinks he has sleep apnea.  He had a sleep study done years ago that was positive but he was told that the illness was not bad enough to require treatment.  He complains of DOE but denies chest pain, diaphoresis, lightheadedness, or fatigue.  He has a painful arthritic bump on his left foot.  He complains of nasal allergies with congestion, sneezing, runny nose, and postnasal drip.  Outpatient Medications Prior to Visit  Medication Sig Dispense Refill  . meloxicam (MOBIC) 15 MG tablet TAKE 1 TABLET BY MOUTH EVERY DAY 90 tablet 1  . valsartan-hydrochlorothiazide (DIOVAN-HCT) 320-25 MG tablet TAKE 1 TABLET BY MOUTH EVERY DAY 90 tablet 0   No facility-administered medications prior to visit.     ROS Review of Systems  Constitutional: Negative for appetite change, chills, diaphoresis, fatigue and fever.  HENT: Positive for congestion, postnasal drip, rhinorrhea and sneezing. Negative for facial swelling, nosebleeds, sinus pressure, sore throat, tinnitus and voice change.   Eyes: Negative for visual disturbance.  Respiratory: Positive for apnea and shortness of breath (DOE). Negative for cough, chest tightness and wheezing.   Cardiovascular: Negative.  Negative for chest pain, palpitations and leg swelling.  Gastrointestinal: Negative for abdominal pain, blood in stool, constipation, diarrhea, nausea and vomiting.  Endocrine: Negative.   Genitourinary: Negative.  Negative for difficulty urinating, scrotal swelling, testicular pain and urgency.  Musculoskeletal: Negative.  Negative for arthralgias, back pain, myalgias and neck  pain.  Skin: Negative.   Allergic/Immunologic: Negative.   Neurological: Negative.  Negative for dizziness, speech difficulty, weakness, light-headedness and headaches.  Hematological: Negative for adenopathy. Does not bruise/bleed easily.  Psychiatric/Behavioral: Negative.  Negative for sleep disturbance. The patient is not nervous/anxious.     Objective:  BP (!) 160/100 (BP Location: Left Arm, Patient Position: Sitting, Cuff Size: Large)   Pulse 86   Temp 98.7 F (37.1 C) (Oral)   Resp 16   Ht 5\' 11"  (1.803 m)   Wt 242 lb (109.8 kg)   SpO2 97%   BMI 33.75 kg/m   BP Readings from Last 3 Encounters:  10/29/17 (!) 160/100  09/07/17 (!) 168/98  10/19/16 120/88    Wt Readings from Last 3 Encounters:  10/29/17 242 lb (109.8 kg)  09/07/17 242 lb (109.8 kg)  10/19/16 249 lb (112.9 kg)    Physical Exam  Constitutional: No distress.  HENT:  Mouth/Throat: Oropharynx is clear and moist. No oropharyngeal exudate.  Eyes: Conjunctivae are normal. Left eye exhibits no discharge. No scleral icterus.  Neck: Normal range of motion. Neck supple. No JVD present. No thyromegaly present.  Cardiovascular: Normal rate, regular rhythm and normal heart sounds. Exam reveals no gallop and no friction rub.  No murmur heard. EKG -      Sinus  Rhythm  -Incomplete right bundle branch block.   ABNORMAL - no change from the prior EKG   Pulmonary/Chest: Effort normal and breath sounds normal. No respiratory distress. He has no wheezes. He has no rales.  Abdominal: Soft. Bowel sounds are normal. He exhibits no distension and no mass. There  is no tenderness. There is no guarding. Hernia confirmed negative in the right inguinal area and confirmed negative in the left inguinal area.  Genitourinary: Prostate normal, testes normal and penis normal. Rectal exam shows guaiac positive stool. Rectal exam shows no external hemorrhoid, no internal hemorrhoid, no fissure, no mass, no tenderness and anal tone  normal. Prostate is not enlarged and not tender. Right testis shows no mass, no swelling and no tenderness. Left testis shows no mass, no swelling and no tenderness. Circumcised. No penile erythema or penile tenderness. No discharge found.  Musculoskeletal:       Feet:  Lymphadenopathy:    He has no cervical adenopathy.       Right: No inguinal adenopathy present.       Left: No inguinal adenopathy present.  Skin: He is not diaphoretic.  Vitals reviewed.   Lab Results  Component Value Date   WBC 7.9 10/29/2017   HGB 15.6 10/29/2017   HCT 45.5 10/29/2017   PLT 212.0 10/29/2017   GLUCOSE 86 10/29/2017   CHOL 168 10/29/2017   TRIG 110.0 10/29/2017   HDL 72.00 10/29/2017   LDLDIRECT 89.0 10/19/2016   LDLCALC 74 10/29/2017   ALT 16 10/29/2017   AST 18 10/29/2017   NA 140 10/29/2017   K 4.0 10/29/2017   CL 100 10/29/2017   CREATININE 0.98 10/29/2017   BUN 20 10/29/2017   CO2 32 10/29/2017   TSH 0.74 10/29/2017   PSA 1.95 10/29/2017   HGBA1C 5.4 10/19/2016    Patient was never admitted.  Assessment & Plan:   Caelum was seen today for hypertension and annual exam.  Diagnoses and all orders for this visit:  Essential hypertension, benign- His blood pressure is not adequately well controlled.  The only remarkable finding on his lab work is vitamin D deficiency which I will treat.  Otherwise, his labs are negative for secondary causes or endorgan damage.  His EKG is negative for LVH. I have asked him to add bystolic to his current regimen. -     Comprehensive metabolic panel; Future -     CBC with Differential/Platelet; Future -     TSH; Future -     Urinalysis, Routine w reflex microscopic; Future -     VITAMIN D 25 Hydroxy (Vit-D Deficiency, Fractures); Future -     EKG 12-Lead -     valsartan-hydrochlorothiazide (DIOVAN-HCT) 320-25 MG tablet; Take 1 tablet by mouth daily. -     nebivolol (BYSTOLIC) 5 MG tablet; Take 1 tablet (5 mg total) by mouth daily.  Routine general  medical examination at a health care facility- Exam completed, labs reviewed, vaccines reviewed and updated, he has been referred for screening colonoscopy, patient education material was given. -     Lipid panel; Future -     PSA; Future  Hyperlipidemia with target LDL less than 160- He has an elevated ASCVD risk score so I have asked him to start taking a statin for CV risk reduction. -     TSH; Future -     rosuvastatin (CRESTOR) 10 MG tablet; Take 1 tablet (10 mg total) by mouth daily.  Sleep apnea, unspecified type -     Ambulatory referral to Sleep Studies  Seasonal allergic rhinitis due to pollen -     levocetirizine (XYZAL) 5 MG tablet; Take 1 tablet (5 mg total) by mouth every evening.  Bunion of left foot -     Ambulatory referral to Podiatry  Occult blood in stools -  Ambulatory referral to Gastroenterology  Vitamin D deficiency -     Cholecalciferol 2000 units TABS; Take 1 tablet (2,000 Units total) by mouth daily.  DOE (dyspnea on exertion)- I have asked him to undergo an ETT to screen for ischemia. -     EXERCISE TOLERANCE TEST (ETT); Future   I have changed Masiyah Mcclintic's valsartan-hydrochlorothiazide. I am also having him start on levocetirizine, nebivolol, Cholecalciferol, and rosuvastatin. Additionally, I am having him maintain his meloxicam.  Meds ordered this encounter  Medications  . levocetirizine (XYZAL) 5 MG tablet    Sig: Take 1 tablet (5 mg total) by mouth every evening.    Dispense:  90 tablet    Refill:  1  . valsartan-hydrochlorothiazide (DIOVAN-HCT) 320-25 MG tablet    Sig: Take 1 tablet by mouth daily.    Dispense:  90 tablet    Refill:  0  . nebivolol (BYSTOLIC) 5 MG tablet    Sig: Take 1 tablet (5 mg total) by mouth daily.    Dispense:  42 tablet    Refill:  0  . Cholecalciferol 2000 units TABS    Sig: Take 1 tablet (2,000 Units total) by mouth daily.    Dispense:  90 tablet    Refill:  1  . rosuvastatin (CRESTOR) 10 MG tablet     Sig: Take 1 tablet (10 mg total) by mouth daily.    Dispense:  90 tablet    Refill:  1     Follow-up: Return in about 6 months (around 04/30/2018).  Scarlette Calico, MD

## 2017-10-31 ENCOUNTER — Encounter: Payer: Self-pay | Admitting: Internal Medicine

## 2017-10-31 DIAGNOSIS — E559 Vitamin D deficiency, unspecified: Secondary | ICD-10-CM | POA: Insufficient documentation

## 2017-10-31 DIAGNOSIS — R195 Other fecal abnormalities: Secondary | ICD-10-CM | POA: Insufficient documentation

## 2017-10-31 MED ORDER — ROSUVASTATIN CALCIUM 10 MG PO TABS: 10.0000 mg | ORAL_TABLET | Freq: Every day | ORAL | 1 refills | Status: DC

## 2017-10-31 MED ORDER — CHOLECALCIFEROL 50 MCG (2000 UT) PO TABS: 1.0000 | ORAL_TABLET | Freq: Every day | ORAL | 1 refills | Status: DC

## 2017-11-09 ENCOUNTER — Encounter: Payer: Self-pay | Admitting: Internal Medicine

## 2017-11-15 ENCOUNTER — Ambulatory Visit (INDEPENDENT_AMBULATORY_CARE_PROVIDER_SITE_OTHER): Payer: Managed Care, Other (non HMO) | Admitting: Podiatry

## 2017-11-15 ENCOUNTER — Ambulatory Visit (INDEPENDENT_AMBULATORY_CARE_PROVIDER_SITE_OTHER): Payer: Managed Care, Other (non HMO)

## 2017-11-15 ENCOUNTER — Encounter: Payer: Self-pay | Admitting: Podiatry

## 2017-11-15 DIAGNOSIS — G609 Hereditary and idiopathic neuropathy, unspecified: Secondary | ICD-10-CM

## 2017-11-15 DIAGNOSIS — M21611 Bunion of right foot: Secondary | ICD-10-CM

## 2017-11-15 DIAGNOSIS — M21619 Bunion of unspecified foot: Secondary | ICD-10-CM

## 2017-11-17 NOTE — Progress Notes (Signed)
   Subjective: 62 year old male presenting as a new patient with a chief complaint of a bunion located to the left foot that has been present for the past 6-7 years. He reports associated numbness of the feet and toes bilaterally. He has not done anything for treatment. There are no modifying factors noted. Patient is here for further evaluation and treatment.    Past Medical History:  Diagnosis Date  . Basal cell carcinoma    Back  . Hypertension       Objective: Physical Exam General: The patient is alert and oriented x3 in no acute distress.  Dermatology: Skin is cool, dry and supple bilateral lower extremities. Negative for open lesions or macerations.  Vascular: Palpable pedal pulses bilaterally. No edema or erythema noted. Capillary refill within normal limits.  Neurological: Epicritic and protective threshold grossly intact bilaterally.   Musculoskeletal Exam: Clinical evidence of bunion deformity noted to the respective foot. There is a no significant pain on palpation range of motion of the first MPJ. Lateral deviation of the hallux noted consistent with hallux abductovalgus.  Radiographic Exam: Increased intermetatarsal angle greater than 15 with a hallux abductus angle greater than 30 noted on AP view. Moderate degenerative changes noted within the first MPJ.  Assessment: 1. 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 Patient was evaluated. X-Rays reviewed. 2. Today we discussed the conservative versus surgical management of bunion deformity. 3. Recommended conservative management of bunion including wide fitting shoes.  4. Return to clinic as needed.    Edrick Kins, DPM [facility]  Dr[address] Edrick Kins[address]                                        [place], [place] [address]                Office [phone]  Fax [phone]

## 2017-11-24 ENCOUNTER — Encounter: Payer: Self-pay | Admitting: Internal Medicine

## 2017-11-24 ENCOUNTER — Ambulatory Visit (INDEPENDENT_AMBULATORY_CARE_PROVIDER_SITE_OTHER): Payer: Managed Care, Other (non HMO)

## 2017-11-24 ENCOUNTER — Other Ambulatory Visit: Payer: Self-pay | Admitting: Internal Medicine

## 2017-11-24 DIAGNOSIS — R0602 Shortness of breath: Secondary | ICD-10-CM

## 2017-11-24 DIAGNOSIS — I10 Essential (primary) hypertension: Secondary | ICD-10-CM

## 2017-11-24 DIAGNOSIS — R0609 Other forms of dyspnea: Secondary | ICD-10-CM | POA: Diagnosis not present

## 2017-11-24 LAB — EXERCISE TOLERANCE TEST
CHL CUP MPHR: 157 {beats}/min
CHL CUP RESTING HR STRESS: 71 {beats}/min
CHL RATE OF PERCEIVED EXERTION: 19
CSEPEW: 5.4 METS
Exercise duration (min): 3 min
Exercise duration (sec): 44 s
Peak HR: 117 {beats}/min
Percent HR: 74 %

## 2017-12-01 ENCOUNTER — Telehealth (HOSPITAL_COMMUNITY): Payer: Self-pay | Admitting: *Deleted

## 2017-12-01 NOTE — Telephone Encounter (Signed)
Left message on voicemail per DPR in reference to upcoming appointment scheduled on 12/02/17 with detailed instructions given per Myocardial Perfusion Study Information Sheet for the test. LM to arrive 15 minutes early, and that it is imperative to arrive on time for appointment to keep from having the test rescheduled. If you need to cancel or reschedule your appointment, please call the office within 24 hours of your appointment. Failure to do so may result in a cancellation of your appointment, and a $50 no show fee. Phone number given for call back for any questions. Kirstie Peri

## 2017-12-06 ENCOUNTER — Ambulatory Visit (HOSPITAL_COMMUNITY): Payer: Managed Care, Other (non HMO) | Attending: Internal Medicine

## 2017-12-06 DIAGNOSIS — R0602 Shortness of breath: Secondary | ICD-10-CM | POA: Diagnosis not present

## 2017-12-06 DIAGNOSIS — R0609 Other forms of dyspnea: Secondary | ICD-10-CM | POA: Insufficient documentation

## 2017-12-06 LAB — MYOCARDIAL PERFUSION IMAGING
CHL CUP NUCLEAR SSS: 3
LV sys vol: 77 mL
LVDIAVOL: 168 mL (ref 62–150)
Peak HR: 97 {beats}/min
RATE: 0.36
Rest HR: 61 {beats}/min
SDS: 3
SRS: 0
TID: 1.08

## 2017-12-06 MED ORDER — TECHNETIUM TC 99M TETROFOSMIN IV KIT
10.1000 | PACK | Freq: Once | INTRAVENOUS | Status: AC | PRN
  Administered 2017-12-06: 10.1 via INTRAVENOUS
  Filled 2017-12-06: qty 11

## 2017-12-06 MED ORDER — REGADENOSON 0.4 MG/5ML IV SOLN
0.4000 mg | Freq: Once | INTRAVENOUS | Status: AC
  Administered 2017-12-06: 0.4 mg via INTRAVENOUS

## 2017-12-06 MED ORDER — TECHNETIUM TC 99M TETROFOSMIN IV KIT
31.8000 | PACK | Freq: Once | INTRAVENOUS | Status: AC | PRN
  Administered 2017-12-06: 31.8 via INTRAVENOUS
  Filled 2017-12-06: qty 32

## 2017-12-11 ENCOUNTER — Encounter: Payer: Self-pay | Admitting: Internal Medicine

## 2017-12-21 ENCOUNTER — Encounter: Payer: Self-pay | Admitting: Neurology

## 2017-12-21 ENCOUNTER — Ambulatory Visit (INDEPENDENT_AMBULATORY_CARE_PROVIDER_SITE_OTHER): Payer: Managed Care, Other (non HMO) | Admitting: Neurology

## 2017-12-21 VITALS — BP 161/101 | HR 71 | Ht 72.0 in | Wt 252.0 lb

## 2017-12-21 DIAGNOSIS — R0681 Apnea, not elsewhere classified: Secondary | ICD-10-CM

## 2017-12-21 DIAGNOSIS — R0683 Snoring: Secondary | ICD-10-CM | POA: Insufficient documentation

## 2017-12-21 DIAGNOSIS — K219 Gastro-esophageal reflux disease without esophagitis: Secondary | ICD-10-CM | POA: Diagnosis not present

## 2017-12-21 DIAGNOSIS — I10 Essential (primary) hypertension: Secondary | ICD-10-CM | POA: Diagnosis not present

## 2017-12-21 DIAGNOSIS — G4719 Other hypersomnia: Secondary | ICD-10-CM | POA: Diagnosis not present

## 2017-12-21 NOTE — Progress Notes (Signed)
SLEEP MEDICINE CLINIC   Provider:  Larey Seat, M D  Primary Care Physician:  Janith Lima, MD   Referring Provider: Janith Lima, MD   Chief Complaint  Patient presents with  . New Patient (Initial Visit)    pt alone, rm 10. pt states that he had sleep study apx 18 years ago and was diagnosed with sleep apnea, started CPAP but wasnt able to use it consistently cause it would fall off. pt does snore in sleep. the most concerning thing that he noticies is that he wakes up gasping for air and has to wake up and sleep in recliner.      HPI:  Henry Figueroa is a 62 y.o. male , seen here in a referral  from Dr. Ronnald Ramp for a sleep evaluation, based on a report of poorly controlled HTN and snoring.   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  He has suffered from hypertension and uses hydrochlorothiazide as a diuretic as well as ARB use.  He has been told that he snores and by now he thinks he may have sleep apnea.  He also recalls that his remote sleep test revealed not enough apnea to justify intervention at the time.  In addition to Diovan hydrochlorothiazide the patient also takes Mobic for arthritis, his BMI is currently around 34.  And blood pressure readings have been rising through the last 3 office visits with his primary. He was placed on another BP medication.    Sleep habits are as follows: 10.30- 11 PM is usually his bed time, but infrequently he finds himself in a comfortable chair asleep while the the TV is watching him in his den. He does not use a TV in the bedroom.  The patient reports that his bedroom is cool, quiet and dark and he prefers to sleep prone but sometimes looking for a cool spot on the pillow he may sleep on his sides.  He is pretty positive that he does not sleep supine.  He sleeps on one pillow for head and chest support on a flat mattress. His  girlfriend noted him to snore, and be daytime sleepy when not active.  He doesn't struggle to go to sleep but to stay asleep, has 2 nocturias (not sure at which time ). He has periods of wakefulness of 20-30 minutes after each.  He sleeps about 5 hours total. He rises at 5.30 , spontaneously. He falls asleep when he rests, sits quietly, and snores, wakes himself snoring.   Sleep medical history and family sleep history:  Father was a loud snorer, sister not affected.  Social history: no children, lives with girlfriend,  He smoked in the past - quit years ago, girlfriend smokes a lot. ETOH-  Hard liquor on occsasions.  Wine with some dinner.  Caffeine ; rarely one a week/ for sodas now, but used to consume diet coke a lot.    Review of Systems: Out of a complete 14 system review, the patient complains of only the following symptoms, and all other reviewed systems are negative.  Snoring, EDS , nocturia, only 5 hours of sleep.   Epworth score 12, Fatigue severity score 24  , depression score N/A-  Not anxious-   Social History   Socioeconomic History  . Marital status: Single    Spouse name: Not on file  . Number of  children: Not on file  . Years of education: Not on file  . Highest education level: Not on file  Occupational History  . Not on file  Social Needs  . Financial resource strain: Not on file  . Food insecurity:    Worry: Not on file    Inability: Not on file  . Transportation needs:    Medical: Not on file    Non-medical: Not on file  Tobacco Use  . Smoking status: Light Tobacco Smoker    Types: Cigarettes    Start date: 10/29/2008  . Smokeless tobacco: Never Used  Substance and Sexual Activity  . Alcohol use: Yes    Alcohol/week: 1.8 oz    Types: 3 Shots of liquor per week  . Drug use: No  . Sexual activity: Not Currently  Lifestyle  . Physical activity:    Days per week: Not on file    Minutes per session: Not on file  . Stress: Not on file  Relationships    . Social connections:    Talks on phone: Not on file    Gets together: Not on file    Attends religious service: Not on file    Active member of club or organization: Not on file    Attends meetings of clubs or organizations: Not on file    Relationship status: Not on file  . Intimate partner violence:    Fear of current or ex partner: Not on file    Emotionally abused: Not on file    Physically abused: Not on file    Forced sexual activity: Not on file  Other Topics Concern  . Not on file  Social History Narrative   Lives alone in a 2 story home.  Has no children.     Works as a Hotel manager.     Education: college degree.    Family History  Problem Relation Age of Onset  . Alcohol abuse Father   . Cancer Neg Hx   . Depression Neg Hx   . Diabetes Neg Hx   . Drug abuse Neg Hx   . Early death Neg Hx   . Heart disease Neg Hx   . Hyperlipidemia Neg Hx   . Hypertension Neg Hx   . Kidney disease Neg Hx   . Stroke Neg Hx   . Dementia Father     Past Medical History:  Diagnosis Date  . Basal cell carcinoma    Back  . Hypertension     Past Surgical History:  Procedure Laterality Date  . KNEE SURGERY Right   . SHOULDER ARTHROSCOPY Left     Current Outpatient Medications  Medication Sig Dispense Refill  . Cholecalciferol 2000 units TABS Take 1 tablet (2,000 Units total) by mouth daily. 90 tablet 1  . levocetirizine (XYZAL) 5 MG tablet Take 1 tablet (5 mg total) by mouth every evening. 90 tablet 1  . meloxicam (MOBIC) 15 MG tablet TAKE 1 TABLET BY MOUTH EVERY DAY 90 tablet 1  . nebivolol (BYSTOLIC) 5 MG tablet Take 1 tablet (5 mg total) by mouth daily. 42 tablet 0  . rosuvastatin (CRESTOR) 10 MG tablet Take 1 tablet (10 mg total) by mouth daily. 90 tablet 1  . valsartan-hydrochlorothiazide (DIOVAN-HCT) 320-25 MG tablet Take 1 tablet by mouth daily. 90 tablet 0   No current facility-administered medications for this visit.     Allergies as of 12/21/2017  . (No Known  Allergies)    Vitals: BP (!) 161/101  Pulse 71   Ht 6' (1.829 m)   Wt 252 lb (114.3 kg)   BMI 34.18 kg/m  Last Weight:  Wt Readings from Last 1 Encounters:  12/21/17 252 lb (114.3 kg)   ZOX:WRUE mass index is 34.18 kg/m.     Last Height:   Ht Readings from Last 1 Encounters:  12/21/17 6' (1.829 m)    Physical exam:  General: The patient is awake, alert and appears not in acute distress. The patient is well groomed. Head: Normocephalic, atraumatic. Neck is supple.  Mallampati 5, large tongue in midline.  neck circumference:19. Nasal airflow patent -Retrognathia is not seen.  Cardiovascular:  Regular rate and rhythm , without  murmurs or carotid bruit, and without distended neck veins. Respiratory: Lungs are clear to auscultation. Skin:  Without evidence of edema, or rash Trunk: BMI is 33. The patient's posture is stooped.   Neurologic exam : The patient is awake and alert, oriented to place and time.    Attention span & concentration ability appears normal.  Speech is fluent,  Mood and affect are appropriate.  Cranial nerves: Pupils are equal and briskly reactive to light. Funduscopic exam without evidence of pallor or edema. Extraocular movements  in vertical and horizontal planes intact and without nystagmus. Visual fields by finger perimetry are intact. Hearing to finger rub intact.  Facial sensation intact to fine touch.Facial motor strength is symmetric and tongue and uvula move midline. Shoulder shrug was symmetrical.  Sensory: Pain in both knees and  Neuropathy in both feet all toes.Fine touch, pinprick and vibration were absent in al toes.  Motor exam: Normal tone, muscle bulk and symmetric strength in all extremities. Coordination: Finger-to-nose maneuver without evidence of ataxia, dysmetria or tremor. Gait and station: Patient walks without assistive device. Turns with 3-4 Steps. Romberg testing is negative. Deep tendon reflexes: in the  upper and lower  extremities are symmetric and intact.   Assessment:  After physical and neurologic examination, review of laboratory studies,  Personal review of imaging studies, reports of other /same  Imaging studies, results of polysomnography and / or neurophysiology testing and pre-existing records as far as provided in visit., my assessment is:   1) Mr. Louie Casa has been witnessed to snore and he has woken himself up snoring mostly by sleeping in a chair on the sofa, unable to control his airway when his head sank to his chest.  His girlfriends spends weekends with him and has noted that he snores louder and a lot. He does have an above average size of neck circumference, and a high-grade Mallampati, describing a narrow upper airway.  His excessive daytime sleepiness Is most evident at rest and when relaxed, but he purposefully has to keep busy also to not fall asleep.  His hypertension has been poor controlled and now Bystolic had to be added to Diovan and hydrochlorothiazide.  It appeared controlled this morning.  In short, Ms. Louie Casa has been definitely obstructive sleep apnea risk factors, and his overall sleep time is reduced.  I would like for him to undergo a sleep test, under [employer] coverage only home sleep test permitted.  This raises a question if he can actually detect when the patient is asleep and when not given that he reports a 5-hour average sleep time at night.  Shows a home sleep has not been indicating a significant amount of apnea I may have to ask him back for an attended sleep study.  We will start with a home sleep  test. . The patient was advised of the nature of the diagnosed disorder , the treatment options and the  risks for general health and wellness arising from not treating the condition.   I spent more than 35 minutes of face to face time with the patient.  Greater than 50% of time was spent in counseling and coordination of care. We have discussed the diagnosis and  differential and I answered the patient's questions.    Plan:  Treatment plan and additional workup : HST as described above.     Larey Seat, MD 2/92/9090, 3:01 AM  Certified in Neurology by ABPN Certified in [place] by [facility] [address] [place], [place] [address]

## 2018-01-27 ENCOUNTER — Other Ambulatory Visit: Payer: Self-pay | Admitting: Internal Medicine

## 2018-01-27 DIAGNOSIS — I10 Essential (primary) hypertension: Secondary | ICD-10-CM

## 2018-02-02 ENCOUNTER — Ambulatory Visit (INDEPENDENT_AMBULATORY_CARE_PROVIDER_SITE_OTHER): Payer: Managed Care, Other (non HMO) | Admitting: Neurology

## 2018-02-02 DIAGNOSIS — G4719 Other hypersomnia: Secondary | ICD-10-CM

## 2018-02-02 DIAGNOSIS — G4733 Obstructive sleep apnea (adult) (pediatric): Secondary | ICD-10-CM | POA: Diagnosis not present

## 2018-02-02 DIAGNOSIS — R0681 Apnea, not elsewhere classified: Secondary | ICD-10-CM

## 2018-02-02 DIAGNOSIS — G4734 Idiopathic sleep related nonobstructive alveolar hypoventilation: Secondary | ICD-10-CM

## 2018-02-02 DIAGNOSIS — I10 Essential (primary) hypertension: Secondary | ICD-10-CM

## 2018-02-02 DIAGNOSIS — R0683 Snoring: Secondary | ICD-10-CM

## 2018-02-02 DIAGNOSIS — K219 Gastro-esophageal reflux disease without esophagitis: Secondary | ICD-10-CM

## 2018-02-02 DIAGNOSIS — R0902 Hypoxemia: Secondary | ICD-10-CM

## 2018-02-09 NOTE — Addendum Note (Signed)
Addended by: Larey Seat on: 02/09/2018 07:58 PM   Modules accepted: Orders

## 2018-02-09 NOTE — Procedures (Signed)
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  He has suffered from hypertension and uses hydrochlorothiazide as a diuretic as well as ARB use.  He has been told that by his girlfriend the he snores. He chokes, wakes from the feeling of not breathing- and by now he thinks he may have sleep apnea.   BMI is currently around 34, his blood pressure readings have been rising through the last 3 office visits with his primary MD. He was just placed on an additional third BP medication.  Epworth score 12/ 24 points, Fatigue severity score 24 points, BMI: 34.1  STUDY RESULTS:  Total Recording Time: 3 hours 6 minutes, valid test time 1 h 24 minutes. Total Apnea/Hypopnea Index (AHI): 17.9 /h; RDI:  19.0 /h Average Oxygen Saturation:  88 %; Lowest Oxygen Desaturation:  80%  Total Time Oxygen Saturation Below or at 88 %:  75.0 minutes  Average Heart Rate:  82 bpm (between 57 and 101 bpm)  IMPRESSION: This HST did not gather enough data for a reliable apnea score, but is concerning for hypoxemia for almost 90% of the limited recorded time.   RECOMMENDATION: Invite for SPLIT night polysomnography, which will allow oxygen titration in case CPAP cannot correct the above noted hypoxemia.   I certify that I have reviewed the raw data recording prior to the issuance of this report in accordance with the standards of the American Academy of Sleep Medicine (AASM). Larey Seat, M.D.    02-10-2016    Medical Director of [facility] at  [facility], accredited by the AASM. Diplomat of the ABPN and ABSM.

## 2018-02-10 ENCOUNTER — Telehealth: Payer: Self-pay

## 2018-02-10 NOTE — Telephone Encounter (Signed)
-----   Message from Larey Seat, MD sent at 02/09/2018  7:57 PM EDT ----- IMPRESSION: This HST did not gather enough data for a reliable  apnea score, but is concerning for hypoxemia for almost 90% of  the limited recorded time.  RECOMMENDATION: Invite for SPLIT night polysomnography, which  will allow oxygen titration in case CPAP cannot correct the above  noted hypoxemia.

## 2018-02-10 NOTE — Telephone Encounter (Signed)
I called pt.  I advised him that his HST did not gather enough data for a reliable apnea score, but Dr. Brett Fairy is concerned about hypoxemia during most of the recorded time. Dr. Brett Fairy recommends that pt complete a split night PSG, which will allow for O2 titration in case a cpap cannot correction the hypoxemia. Pt is agreeable to this and verbalized understanding of results. Pt had no questions at this time but was encouraged to call back if questions arise.

## 2018-02-14 ENCOUNTER — Ambulatory Visit (INDEPENDENT_AMBULATORY_CARE_PROVIDER_SITE_OTHER): Payer: Managed Care, Other (non HMO) | Admitting: Family

## 2018-02-14 ENCOUNTER — Ambulatory Visit (INDEPENDENT_AMBULATORY_CARE_PROVIDER_SITE_OTHER)
Admission: RE | Admit: 2018-02-14 | Discharge: 2018-02-14 | Disposition: A | Discharge status: Home / Self Care | Payer: Managed Care, Other (non HMO) | Source: Ambulatory Visit | Attending: Family | Admitting: Family

## 2018-02-14 ENCOUNTER — Encounter: Payer: Self-pay | Admitting: Family

## 2018-02-14 VITALS — BP 158/92 | HR 97 | Temp 98.0°F | Ht 72.0 in

## 2018-02-14 DIAGNOSIS — M5416 Radiculopathy, lumbar region: Secondary | ICD-10-CM

## 2018-02-14 MED ORDER — METHYLPREDNISOLONE 4 MG PO TBPK: ORAL_TABLET | ORAL | 0 refills | Status: DC

## 2018-02-14 NOTE — Progress Notes (Signed)
Henry Figueroa is a 62 y.o. male with the following history as recorded in [employer]:  Patient Active Problem List   Diagnosis Date Noted  . Snoring 12/21/2017  . Excessive daytime sleepiness 12/21/2017  . GERD with apnea 12/21/2017  . Dyspnea on exertion 11/24/2017  . Occult blood in stools 10/31/2017  . Vitamin D deficiency 10/31/2017  . Sleep apnea 10/29/2017  . Seasonal allergic rhinitis due to pollen 10/29/2017  . Bunion of left foot 10/29/2017  . Routine general medical examination at a health care facility 10/19/2016  . Trochanteric bursitis of left hip 09/14/2015  . Basal cell carcinoma of back 03/26/2015  . 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 Essential hypertension, benign 10/29/2014  . Hyperlipidemia with target LDL less than 160 10/29/2014  . Primary osteoarthritis of both knees 10/29/2014    Current Outpatient Medications  Medication Sig Dispense Refill  . Cholecalciferol 2000 units TABS Take 1 tablet (2,000 Units total) by mouth daily. 90 tablet 1  . levocetirizine (XYZAL) 5 MG tablet Take 1 tablet (5 mg total) by mouth every evening. 90 tablet 1  . meloxicam (MOBIC) 15 MG tablet TAKE 1 TABLET BY MOUTH EVERY DAY 90 tablet 1  . nebivolol (BYSTOLIC) 5 MG tablet Take 1 tablet (5 mg total) by mouth daily. 42 tablet 0  . rosuvastatin (CRESTOR) 10 MG tablet Take 1 tablet (10 mg total) by mouth daily. 90 tablet 1  . valsartan-hydrochlorothiazide (DIOVAN-HCT) 320-25 MG tablet TAKE 1 TABLET BY MOUTH EVERY DAY 90 tablet 0  . methylPREDNISolone (MEDROL DOSEPAK) 4 MG TBPK tablet Taper as directed 21 tablet 0   No current facility-administered medications for this visit.     Allergies: Patient has no known allergies.  Past Medical History:  Diagnosis Date  . Basal cell carcinoma    Back  . Hypertension     Past Surgical History:  Procedure Laterality Date  . KNEE SURGERY Right   . SHOULDER ARTHROSCOPY Left     Family History  Problem Relation Age of Onset  .  Alcohol abuse Father   . Cancer Neg Hx   . Depression Neg Hx   . Diabetes Neg Hx   . Drug abuse Neg Hx   . Early death Neg Hx   . Heart disease Neg Hx   . Hyperlipidemia Neg Hx   . Hypertension Neg Hx   . Kidney disease Neg Hx   . Stroke Neg Hx   . Dementia Father     Social History   Tobacco Use  . Smoking status: Light Tobacco Smoker    Types: Cigarettes    Start date: 10/29/2008  . Smokeless tobacco: Never Used  Substance Use Topics  . Alcohol use: Yes    Alcohol/week: 1.8 oz    Types: 3 Shots of liquor per week    Subjective:  Patient presents with concerns for low back pain/ radiating symptoms into left leg; symptoms x 3-4 weeks; no known injury or trauma; does have history of sciatica in his left hip; symptoms seem to be better when walking upstairs; takes Mobic daily; difficult sleeping at night due to pain- unable to get comfortable; no changes in bowel or bladder habits; feels like radiating symptoms stop around the area of the calf; FH of DDD- mother;  Objective:  Vitals:   02/14/18 1613  BP: (!) 158/92  Pulse: 97  Temp: 98 F (36.7 C)  TempSrc: Oral  SpO2: 98%  Height: 6' (1.829 m)    General: Well developed, well  nourished, in no acute distress  Skin : Warm and dry.  Head: Normocephalic and atraumatic  Lungs: Respirations unlabored; cMusculoskeletal: No deformities; no active joint inflammation  Extremities: No edema, cyanosis, clubbing  Vessels: Symmetric bilaterally  Neurologic: Alert and oriented; speech intact; face symmetrical; moves all extremities well; CNII-XII intact without focal deficit  Assessment:  1. Lumbar back pain with radiculopathy affecting lower extremity     Plan:  Update lumbar X-ray; trial of Medrol Dose pak- hold Mobic while taking this; follow-up to be determined; may need Mri and/or PT;   No follow-ups on file.  Orders Placed This Encounter  Procedures  . DG Lumbar Spine 2-3 Views    Standing Status:   Future    Number of  Occurrences:   1    Standing Expiration Date:   04/18/2019    Order Specific Question:   Reason for Exam (SYMPTOM  OR DIAGNOSIS REQUIRED)    Answer:   low back pain/ numbness and tingling    Order Specific Question:   Preferred imaging location?    Answer:   [facility]    Order Specific Question:   Radiology Contrast Protocol - do NOT remove file path    Answer:   [email or web address]    Requested Prescriptions   Signed Prescriptions Disp Refills  . methylPREDNISolone (MEDROL DOSEPAK) 4 MG TBPK tablet 21 tablet 0    Sig: Taper as directed

## 2018-02-18 ENCOUNTER — Telehealth: Payer: Self-pay | Admitting: Internal Medicine

## 2018-02-18 DIAGNOSIS — M545 Low back pain, unspecified: Secondary | ICD-10-CM

## 2018-02-18 MED ORDER — GABAPENTIN 100 MG PO CAPS: 200.0000 mg | ORAL_CAPSULE | Freq: Every day | ORAL | 0 refills | Status: DC

## 2018-02-18 NOTE — Telephone Encounter (Signed)
I am going to call in Gabapentin for him; try taking 200 mg at night; will also update Rx for MRI- need to get better picture of his back.

## 2018-02-18 NOTE — Telephone Encounter (Signed)
Copied from [place] [id number]. Topic: Quick Communication - See Telephone Encounter >> Feb 18, 2018  1:55 PM Mylinda Latina, NT wrote: CRM for notification. See Telephone encounter for: 02/18/18. Patient called and states he seen Jodi Mourning on the 02/14/18 and she prescribed methylPREDNISolone (MEDROL DOSEPAK) 4 MG TBPK tablet . He states that the medicine is not working well. He still cant walk well . He is wondering if something else can be called in for him. If you have any questions please call CB# [phone]  [id number] [place], [place] - [address] [phone] (Phone) [phone] (Fax)

## 2018-02-21 NOTE — Telephone Encounter (Signed)
Spoke with patient today and info given. 

## 2018-02-22 ENCOUNTER — Telehealth: Payer: Self-pay

## 2018-02-22 DIAGNOSIS — M79605 Pain in left leg: Secondary | ICD-10-CM

## 2018-02-22 DIAGNOSIS — M5416 Radiculopathy, lumbar region: Secondary | ICD-10-CM

## 2018-02-22 NOTE — Telephone Encounter (Signed)
Referral entered per pt request. Mychart sent to pt informing of same.   Copied from [place] [phone]. Topic: Referral - Request >> Feb 21, 2018 12:37 PM Synthia Innocent wrote: Reason for CRM: Requesting referral to see Marlowe Kays with [facility], for back and left leg pain

## 2018-02-23 ENCOUNTER — Telehealth: Payer: Self-pay

## 2018-02-23 DIAGNOSIS — I10 Essential (primary) hypertension: Secondary | ICD-10-CM

## 2018-02-23 DIAGNOSIS — G4719 Other hypersomnia: Secondary | ICD-10-CM

## 2018-02-23 DIAGNOSIS — R0902 Hypoxemia: Secondary | ICD-10-CM

## 2018-02-23 DIAGNOSIS — G4733 Obstructive sleep apnea (adult) (pediatric): Secondary | ICD-10-CM

## 2018-02-23 DIAGNOSIS — G4734 Idiopathic sleep related nonobstructive alveolar hypoventilation: Principal | ICD-10-CM

## 2018-02-23 NOTE — Telephone Encounter (Signed)
Insurance has denied in lab study request.  You may want to consider an autoPAP with [facility].

## 2018-02-23 NOTE — Addendum Note (Signed)
Addended by: Larey Seat on: 02/23/2018 04:12 PM   Modules accepted: Orders

## 2018-02-23 NOTE — Telephone Encounter (Signed)
Insurance denied attended cpap titration. Will order auto cpap with [facility] .

## 2018-02-24 ENCOUNTER — Other Ambulatory Visit: Payer: Self-pay | Admitting: Family

## 2018-02-24 ENCOUNTER — Encounter: Payer: Self-pay | Admitting: Family

## 2018-02-24 MED ORDER — TRAMADOL HCL 50 MG PO TABS: 50.0000 mg | ORAL_TABLET | Freq: Three times a day (TID) | ORAL | 0 refills | Status: DC | PRN

## 2018-02-24 NOTE — Telephone Encounter (Signed)
Called the patient and made him aware the insurance denied the in lab titration test. Orders were placed by Dr Brett Fairy for a auto CPAP machine. Reviewed what that means and how the auto CPAP works.I reviewed PAP compliance expectations with the pt. Pt is agreeable to starting a CPAP. I advised pt that an order will be sent to a DME, [facility], and [facility] will call the pt within about one week after they file with the pt's insurance. [facility] will show the pt how to use the machine, fit for masks, and troubleshoot the CPAP if needed. A follow up appt was made for insurance purposes with Dr. Brett Fairy on Oct 8,2019 at 8:30 am. Pt verbalized understanding to arrive 15 minutes early and bring their CPAP. A letter with all of this information in it will be mailed to the pt as a reminder. I verified with the pt that the address we have on file is correct. Pt verbalized understanding of results. Pt had no questions at this time but was encouraged to call back if questions arise.

## 2018-02-28 NOTE — Telephone Encounter (Signed)
MRI was denied due to pt needs 6 wk trial of conservative treatment with Dr follow up within 3 mos

## 2018-02-28 NOTE — Telephone Encounter (Signed)
Please call him and let him know that his insurance is not approving the MRI request. I noticed he sent a message asking for notes to submit for Worker's Comp. If this is a Worker's Comp injury, he needs to talk to them about follow-up. Otherwise, I think we should have him see one of the providers at [facility] since he is already doing PT there. Please let us know what he needs.

## 2018-03-03 ENCOUNTER — Other Ambulatory Visit: Payer: Self-pay | Admitting: Internal Medicine

## 2018-03-03 DIAGNOSIS — M7062 Trochanteric bursitis, left hip: Secondary | ICD-10-CM

## 2018-03-03 DIAGNOSIS — M5416 Radiculopathy, lumbar region: Secondary | ICD-10-CM

## 2018-03-04 ENCOUNTER — Encounter: Payer: Self-pay | Admitting: Family

## 2018-03-04 NOTE — Telephone Encounter (Signed)
Please cal him to get this message clarified. I am confused as to why he keeps asking [name] about Worker's Comp and now returning to work. I am fine with him going back to work but we never discussed him being out of work. Did someone else take him out of work?

## 2018-03-15 ENCOUNTER — Telehealth: Payer: Self-pay

## 2018-03-15 ENCOUNTER — Other Ambulatory Visit: Payer: Self-pay | Admitting: Internal Medicine

## 2018-03-15 DIAGNOSIS — M5416 Radiculopathy, lumbar region: Secondary | ICD-10-CM

## 2018-03-15 MED ORDER — TRAMADOL HCL 50 MG PO TABS: 50.0000 mg | ORAL_TABLET | Freq: Two times a day (BID) | ORAL | 0 refills | Status: DC | PRN

## 2018-03-15 MED ORDER — GABAPENTIN 100 MG PO CAPS: 200.0000 mg | ORAL_CAPSULE | Freq: Every day | ORAL | 0 refills | Status: DC

## 2018-03-15 NOTE — Telephone Encounter (Signed)
Patient request refills on his Tramadol and Gabapentin. (Request 90 day supply on both)  [facility] [employer]

## 2018-03-15 NOTE — Telephone Encounter (Signed)
Message left for patient to contact office and set up appointment at his convenience for blood pressure recheck and follow up.

## 2018-03-15 NOTE — Telephone Encounter (Signed)
IO'M sent Ask him to come in soon for a BP recheck

## 2018-03-17 ENCOUNTER — Other Ambulatory Visit: Payer: Self-pay | Admitting: Internal Medicine

## 2018-03-17 ENCOUNTER — Telehealth: Payer: Self-pay

## 2018-03-17 DIAGNOSIS — M5416 Radiculopathy, lumbar region: Secondary | ICD-10-CM

## 2018-03-17 MED ORDER — GABAPENTIN 100 MG PO CAPS: 200.0000 mg | ORAL_CAPSULE | Freq: Every day | ORAL | 0 refills | Status: DC

## 2018-03-17 NOTE — Telephone Encounter (Signed)
Got it.  Thanks Cecille Rubin

## 2018-03-17 NOTE — Telephone Encounter (Signed)
I believe the insurance was denied because he hasn't had 6 wk conservative treatment within a 3 mos period. It looks like he still has a couple weeks left for 6 wks. First appt was on 7/22. So, it'll probably be up to the Henry Figueroa on whether to go ahead and see ortho (may be the smarter route?) or make another appt with laura in couple weeks and we could try again then.

## 2018-03-17 NOTE — Telephone Encounter (Signed)
Henry Figueroa,  Patient wants to see about proceeding with MRI but looks like it was denied by his insurance? Is there anything else that can be done? Is he able to appeal decision or should he go with the referral to ortho and let them work him up and try to get his MRI approved?  Let me know your thoughts.  Thanks

## 2018-03-18 ENCOUNTER — Ambulatory Visit (INDEPENDENT_AMBULATORY_CARE_PROVIDER_SITE_OTHER): Payer: Managed Care, Other (non HMO) | Admitting: Nurse Practitioner

## 2018-03-18 ENCOUNTER — Encounter: Payer: Self-pay | Admitting: Nurse Practitioner

## 2018-03-18 VITALS — BP 140/100 | HR 70 | Ht 72.0 in | Wt 255.0 lb

## 2018-03-18 DIAGNOSIS — M79605 Pain in left leg: Secondary | ICD-10-CM | POA: Diagnosis not present

## 2018-03-18 DIAGNOSIS — M5416 Radiculopathy, lumbar region: Secondary | ICD-10-CM

## 2018-03-18 NOTE — Patient Instructions (Addendum)
I have replaced referral to PT for you.  Please schedule a follow up appointment for further evaluation here with Dr Tamala Julian or Dr Raeford Razor, our sports medicine providers, to see if they can help with trigger point injections   Back Pain, Adult Back pain is very common. The pain often gets better over time. The cause of back pain is usually not dangerous. Most people can learn to manage their back pain on their own. Follow these instructions at home: Watch your back pain for any changes. The following actions may help to lessen any pain you are feeling:  Stay active. Start with short walks on flat ground if you can. Try to walk farther each day.  Exercise regularly as told by your doctor. Exercise helps your back heal faster. It also helps avoid future injury by keeping your muscles strong and flexible.  Do not sit, drive, or stand in one place for more than 30 minutes.  Do not stay in bed. Resting more than 1-2 days can slow down your recovery.  Be careful when you bend or lift an object. Use good form when lifting: ? Bend at your knees. ? Keep the object close to your body. ? Do not twist.  Sleep on a firm mattress. Lie on your side, and bend your knees. If you lie on your back, put a pillow under your knees.  Take medicines only as told by your doctor.  Put ice on the injured area. ? Put ice in a plastic bag. ? Place a towel between your skin and the bag. ? Leave the ice on for 20 minutes, 2-3 times a day for the first 2-3 days. After that, you can switch between ice and heat packs.  Avoid feeling anxious or stressed. Find good ways to deal with stress, such as exercise.  Maintain a healthy weight. Extra weight puts stress on your back.  Contact a doctor if:  You have pain that does not go away with rest or medicine.  You have worsening pain that goes down into your legs or buttocks.  You have pain that does not get better in one week.  You have pain at night.  You lose  weight.  You have a fever or chills. Get help right away if:  You cannot control when you poop (bowel movement) or pee (urinate).  Your arms or legs feel weak.  Your arms or legs lose feeling (numbness).  You feel sick to your stomach (nauseous) or throw up (vomit).  You have belly (abdominal) pain.  You feel like you may pass out (faint). This information is not intended to replace advice given to you by your health care provider. Make sure you discuss any questions you have with your health care provider. Document Released: 12/30/2007 Document Revised: 12/19/2015 Document Reviewed: 11/14/2013 Elsevier Interactive Patient Education  [employer].

## 2018-03-18 NOTE — Progress Notes (Signed)
Name: Henry Figueroa   MRN: [id number]    DOB: 20-Jul-1956   Date:03/18/2018       Progress Note  Subjective  Chief Complaint Back pain  HPI  [name] is here today for evaluation of lower back and left leg pain. He was first seen for this by another provider in our practice on 02/14/18, and over the past month has tried course of NSAIDs, medrol, started on gabapentin and physical therapy. Overall he feels like his pain is improving but did have a bad day yesterday, had to leave work early due to an increase in pain. He describes his pain as soreness in lower back with sharp stabbing pain radiating down his left leg. An MRI was actually ordered for his pain on 7/26 but was not approved due to insurance requirement of 6 weeks of PT prior to MRI approval, he is currently on around week 4 of PT, first PT course is done but he is interested in renewing referral to continue PT today. No fevers, chills, weakness, swelling, erythema.   Patient Active Problem List   Diagnosis Date Noted  . Snoring 12/21/2017  . Excessive daytime sleepiness 12/21/2017  . GERD with apnea 12/21/2017  . Dyspnea on exertion 11/24/2017  . Occult blood in stools 10/31/2017  . Vitamin D deficiency 10/31/2017  . Sleep apnea 10/29/2017  . Seasonal allergic rhinitis due to pollen 10/29/2017  . Bunion of left foot 10/29/2017  . Routine general medical examination at a health care facility 10/19/2016  . Trochanteric bursitis of left hip 09/14/2015  . Basal cell carcinoma of back 03/26/2015  . 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 Essential hypertension, benign 10/29/2014  . Hyperlipidemia with target LDL less than 160 10/29/2014  . Primary osteoarthritis of both knees 10/29/2014    Social History   Tobacco Use  . Smoking status: Light Tobacco Smoker    Types: Cigarettes    Start date: 10/29/2008  . Smokeless tobacco: Never Used  Substance Use Topics  . Alcohol use: Yes    Alcohol/week: 3.0 standard drinks   Types: 3 Shots of liquor per week     Current Outpatient Medications:  .  Cholecalciferol 2000 units TABS, Take 1 tablet (2,000 Units total) by mouth daily., Disp: 90 tablet, Rfl: 1 .  gabapentin (NEURONTIN) 100 MG capsule, Take 2 capsules (200 mg total) by mouth at bedtime., Disp: 90 capsule, Rfl: 0 .  levocetirizine (XYZAL) 5 MG tablet, Take 1 tablet (5 mg total) by mouth every evening., Disp: 90 tablet, Rfl: 1 .  meloxicam (MOBIC) 15 MG tablet, TAKE 1 TABLET BY MOUTH EVERY DAY, Disp: 90 tablet, Rfl: 0 .  nebivolol (BYSTOLIC) 5 MG tablet, Take 1 tablet (5 mg total) by mouth daily., Disp: 42 tablet, Rfl: 0 .  rosuvastatin (CRESTOR) 10 MG tablet, Take 1 tablet (10 mg total) by mouth daily., Disp: 90 tablet, Rfl: 1 .  traMADol (ULTRAM) 50 MG tablet, Take 1 tablet (50 mg total) by mouth every 12 (twelve) hours as needed., Disp: 180 tablet, Rfl: 0 .  valsartan-hydrochlorothiazide (DIOVAN-HCT) 320-25 MG tablet, TAKE 1 TABLET BY MOUTH EVERY DAY, Disp: 90 tablet, Rfl: 0  No Known Allergies  ROS   No other specific complaints in a complete review of systems (except as listed in HPI above).  Objective  Vitals:   03/18/18 0855  BP: (!) 140/100  Pulse: 70  SpO2: 95%  Weight: 255 lb (115.7 kg)  Height: 6' (1.829 m)    Body mass index  is 34.58 kg/m.  Nursing Note and Vital Signs reviewed.  Physical Exam  Constitutional: Patient appears well-developed and well-nourished. No distress.  HEENT: head atraumatic, normocephalic, pupils equal and reactive to light, EOM's intact, neck supple, oropharynx pink and moist without exudate Cardiovascular: Normal rate, regular rhythm, S1/S2 present.  Distal pulses intact. Pulmonary/Chest: Effort normal and breath sounds clear. No respiratory distress or retractions. Musculoskeletal: Normal range of motion, No gross deformities. Left leg without swelling or erythema. Neurological: He is alert and oriented to person, place, and time. No cranial nerve  deficit. Coordination, balance, strength, speech and gait are normal. DTRs normal and symmetric. Skin: Skin is warm and dry. No rash noted. No erythema.  Psychiatric: Patient has a normal mood and affect. behavior is normal. Judgment and thought content normal.  Assessment & Plan  1. Lumbar back pain with radiculopathy affecting lower extremity, Left leg pain PT ordered placed today Discussed common course of back pain and that symptoms could persist for several months Continue neurontin Continue PT Recommended F/U with our Sports med providers to consider trigger point injections We discussed options to re-order MRI for continued back and leg pain vs referral to orthopedics for evaluation if pain continues, he will follow up if needed -home management, Red flags and when to present for emergency care or RTC including new/worsening/un-resolving symptoms,  reviewed with patient and provided in AVS. - AMB referral to orthopedics

## 2018-03-23 NOTE — Progress Notes (Signed)
Henry Figueroa - 62 y.o. male MRN [id number]  Date of birth: 05/17/1956  SUBJECTIVE:  Including CC & ROS.  Chief Complaint  Patient presents with  . Leg Pain  . Back Pain    Henry Figueroa is a 62 y.o. male that is presenting with back pain and left lower leg pain. Pain is localized at his lateral calf. Ongoing for two months. Pain was intermittent in nature, progressed to a constant pain. Pain is mild to severe when standing and ambulating. Denies injury or trauma. He stands for majority of the day for his job. He has been taking gabapentin and tramadol for the pain. He also has bilateral lower back pain has been present for two months. Located near his tailbone. Admits to tingling and numbness. Pain is constant. Pain is worse when he stands daily. Has been going to physical therapy for 4 weeks and reports 30% improvement. Drives a lot for work around town.     Review of Systems  Constitutional: Negative for fever.  HENT: Negative for congestion.   Respiratory: Negative for cough.   Cardiovascular: Negative for chest pain.  Gastrointestinal: Negative for abdominal pain.  Musculoskeletal: Positive for back pain.  Skin: Negative for color change.  Neurological: Negative for weakness.  Hematological: Negative for adenopathy.  Psychiatric/Behavioral: Negative for agitation.    HISTORY: Past Medical, Surgical, Social, and Family History Reviewed & Updated per EMR.   Pertinent Historical Findings include:  Past Medical History:  Diagnosis Date  . Basal cell carcinoma    Back  . Hypertension     Past Surgical History:  Procedure Laterality Date  . KNEE SURGERY Right   . SHOULDER ARTHROSCOPY Left     No Known Allergies  Family History  Problem Relation Age of Onset  . Alcohol abuse Father   . Cancer Neg Hx   . Depression Neg Hx   . Diabetes Neg Hx   . Drug abuse Neg Hx   . Early death Neg Hx   . Heart disease Neg Hx   . Hyperlipidemia Neg Hx   . Hypertension Neg Hx   .  Kidney disease Neg Hx   . Stroke Neg Hx   . Dementia Father      Social History   Socioeconomic History  . Marital status: Single    Spouse name: Not on file  . Number of children: Not on file  . Years of education: Not on file  . Highest education level: Not on file  Occupational History  . Not on file  Social Needs  . Financial resource strain: Not on file  . Food insecurity:    Worry: Not on file    Inability: Not on file  . Transportation needs:    Medical: Not on file    Non-medical: Not on file  Tobacco Use  . Smoking status: Light Tobacco Smoker    Types: Cigarettes    Start date: 10/29/2008  . Smokeless tobacco: Never Used  Substance and Sexual Activity  . Alcohol use: Yes    Alcohol/week: 3.0 standard drinks    Types: 3 Shots of liquor per week  . Drug use: No  . Sexual activity: Not Currently  Lifestyle  . Physical activity:    Days per week: Not on file    Minutes per session: Not on file  . Stress: Not on file  Relationships  . Social connections:    Talks on phone: Not on file    Gets together: Not on file  Attends religious service: Not on file    Active member of club or organization: Not on file    Attends meetings of clubs or organizations: Not on file    Relationship status: Not on file  . Intimate partner violence:    Fear of current or ex partner: Not on file    Emotionally abused: Not on file    Physically abused: Not on file    Forced sexual activity: Not on file  Other Topics Concern  . Not on file  Social History Narrative   Lives alone in a 2 story home.  Has no children.     Works as a Hotel manager.     Education: college degree.     PHYSICAL EXAM:  VS: BP (!) 156/98 (BP Location: Left Arm, Patient Position: Sitting, Cuff Size: Normal)   Pulse 96   Ht 6' (1.829 m)   Wt 251 lb (113.9 kg)   SpO2 96%   BMI 34.04 kg/m  Physical Exam Gen: NAD, alert, cooperative with exam, well-appearing ENT: normal lips, normal nasal mucosa,    Eye: normal EOM, normal conjunctiva and lids CV:  no edema, +2 pedal pulses   Resp: no accessory muscle use, non-labored,  Skin: no rashes, no areas of induration  Neuro: normal tone, normal sensation to touch Psych:  normal insight, alert and oriented MSK:  Back/left leg:  TTP of the lower back paraspinal muscles  No SI joint tenderness  No TTP of the GT  Mild TTP of the left buttock  Normal IR and ER of the hip  Normal strength to resistance with hip flexion, knee flexion and extension, plantarflexion and dorsalflexion  DTR's are equal at the patella  Negative SLR b/l. Tight hamstrings  Neurovascularly intact   Limited ultrasound: left leg:  No abnormality seen in the lateral or medial gastroc  Normal septum  Normal musculotendinous junction   Summary: normal exam   Ultrasound and interpretation by Clearance Coots, MD        ASSESSMENT & PLAN:   Low back pain with sciatica Low back pain seems to be muscular in nature. His left lower leg pain seems sciatic. No inciting event and [name] was normal.  - continue PT and medications  - counseled on supportive care - if still not having improvement in the next 2-4 weeks, consider MRI Lumbar spine for nerve impingement.

## 2018-03-24 ENCOUNTER — Encounter: Payer: Self-pay | Admitting: Family Medicine

## 2018-03-24 ENCOUNTER — Ambulatory Visit (INDEPENDENT_AMBULATORY_CARE_PROVIDER_SITE_OTHER): Payer: Managed Care, Other (non HMO) | Admitting: Family Medicine

## 2018-03-24 DIAGNOSIS — M5442 Lumbago with sciatica, left side: Secondary | ICD-10-CM

## 2018-03-24 DIAGNOSIS — M5440 Lumbago with sciatica, unspecified side: Secondary | ICD-10-CM | POA: Insufficient documentation

## 2018-03-24 NOTE — Assessment & Plan Note (Signed)
Low back pain seems to be muscular in nature. His left lower leg pain seems sciatic. No inciting event and [name] was normal.  - continue PT and medications  - counseled on supportive care - if still not having improvement in the next 2-4 weeks, consider MRI Lumbar spine for nerve impingement.

## 2018-03-24 NOTE — Patient Instructions (Signed)
Nice to meet you  Please continue the gabapentin  Take tylenol 650 mg three times a day is the best evidence based medicine we have for arthritis.  Glucosamine sulfate 750mg  twice a day is a supplement that has been shown to help moderate to severe arthritis. Vitamin D 2000 IU daily Fish oil 2 grams daily.  Tumeric 500mg  twice daily.  Please continue physical therapy. If you aren't making improvement in the next 2-4 weeks then please give me a call back and I'll order an MRI of the lumbar spine.

## 2018-03-31 NOTE — Telephone Encounter (Signed)
Completed already. 

## 2018-04-05 ENCOUNTER — Other Ambulatory Visit (INDEPENDENT_AMBULATORY_CARE_PROVIDER_SITE_OTHER): Payer: Managed Care, Other (non HMO)

## 2018-04-05 ENCOUNTER — Ambulatory Visit (INDEPENDENT_AMBULATORY_CARE_PROVIDER_SITE_OTHER): Payer: Managed Care, Other (non HMO) | Admitting: Internal Medicine

## 2018-04-05 ENCOUNTER — Encounter: Payer: Self-pay | Admitting: Internal Medicine

## 2018-04-05 VITALS — BP 160/102 | HR 93 | Temp 98.2°F | Ht 72.0 in | Wt 259.0 lb

## 2018-04-05 DIAGNOSIS — I10 Essential (primary) hypertension: Secondary | ICD-10-CM

## 2018-04-05 DIAGNOSIS — E559 Vitamin D deficiency, unspecified: Secondary | ICD-10-CM | POA: Diagnosis not present

## 2018-04-05 DIAGNOSIS — M5416 Radiculopathy, lumbar region: Secondary | ICD-10-CM

## 2018-04-05 LAB — BASIC METABOLIC PANEL
BUN: 20 mg/dL (ref 6–23)
CALCIUM: 9.9 mg/dL (ref 8.4–10.5)
CO2: 31 mEq/L (ref 19–32)
CREATININE: 1.16 mg/dL (ref 0.40–1.50)
Chloride: 101 mEq/L (ref 96–112)
GFR: 67.48 mL/min (ref >60.00)
GLUCOSE: 85 mg/dL (ref 70–99)
Potassium: 3.9 mEq/L (ref 3.5–5.1)
Sodium: 142 mEq/L (ref 135–145)

## 2018-04-05 LAB — VITAMIN D 25 HYDROXY (VIT D DEFICIENCY, FRACTURES): VITD: 24.68 ng/mL — ABNORMAL LOW (ref 30.00–100.00)

## 2018-04-05 MED ORDER — CHOLECALCIFEROL 1.25 MG (50000 UT) PO CAPS: 50000.0000 [IU] | ORAL_CAPSULE | ORAL | 1 refills | Status: DC

## 2018-04-05 MED ORDER — AZILSARTAN-CHLORTHALIDONE 40-25 MG PO TABS: 1.0000 | ORAL_TABLET | Freq: Every day | ORAL | 0 refills | Status: DC

## 2018-04-05 MED ORDER — NEBIVOLOL HCL 5 MG PO TABS: 5.0000 mg | ORAL_TABLET | Freq: Every day | ORAL | 0 refills | Status: DC

## 2018-04-05 NOTE — Progress Notes (Signed)
Subjective:  Patient ID: Henry Figueroa, male    DOB: 04/28/1956  Age: 62 y.o. MRN: [id number]  CC: Hypertension and Back Pain   HPI Henry Figueroa presents for f/up - He continues to complain of low back pain that radiates into both thighs.  He has tried physical therapy without much relief from his symptoms.  He complains of numbness in both feet.  He has not had the MRI of the lumbar spine completed yet.  He also tells me his blood pressure has not been well controlled.  He has had some headaches recently and he complains of weight gain.  He is not using CPAP yet.  He is not taking nebivolol.  He thinks he ran out.  Tells me he is taking a vitamin D supplement.  Outpatient Medications Prior to Visit  Medication Sig Dispense Refill  . gabapentin (NEURONTIN) 100 MG capsule Take 2 capsules (200 mg total) by mouth at bedtime. 90 capsule 0  . levocetirizine (XYZAL) 5 MG tablet Take 1 tablet (5 mg total) by mouth every evening. 90 tablet 1  . rosuvastatin (CRESTOR) 10 MG tablet Take 1 tablet (10 mg total) by mouth daily. 90 tablet 1  . traMADol (ULTRAM) 50 MG tablet Take 1 tablet (50 mg total) by mouth every 12 (twelve) hours as needed. 180 tablet 0  . Cholecalciferol 2000 units TABS Take 1 tablet (2,000 Units total) by mouth daily. 90 tablet 1  . valsartan-hydrochlorothiazide (DIOVAN-HCT) 320-25 MG tablet TAKE 1 TABLET BY MOUTH EVERY DAY 90 tablet 0  . meloxicam (MOBIC) 15 MG tablet TAKE 1 TABLET BY MOUTH EVERY DAY 90 tablet 0  . nebivolol (BYSTOLIC) 5 MG tablet Take 1 tablet (5 mg total) by mouth daily. (Patient not taking: Reported on 04/05/2018) 42 tablet 0   No facility-administered medications prior to visit.     ROS Review of Systems  Constitutional: Negative for appetite change, diaphoresis and fatigue.  HENT: Negative.   Eyes: Negative for visual disturbance.  Respiratory: Positive for apnea. Negative for cough, chest tightness, shortness of breath and wheezing.        He is not  yet using CPAP  Cardiovascular: Negative for chest pain, palpitations and leg swelling.  Gastrointestinal: Negative for abdominal pain, constipation, diarrhea, nausea and vomiting.  Genitourinary: Negative.  Negative for difficulty urinating and hematuria.  Musculoskeletal: Positive for back pain. Negative for myalgias and neck pain.  Skin: Negative.   Neurological: Positive for numbness and headaches. Negative for dizziness and weakness.  Hematological: Negative for adenopathy. Does not bruise/bleed easily.  Psychiatric/Behavioral: Negative.     Objective:  BP (!) 160/102 (BP Location: Left Arm, Patient Position: Sitting, Cuff Size: Large)   Pulse 93   Temp 98.2 F (36.8 C) (Oral)   Ht 6' (1.829 m)   Wt 259 lb (117.5 kg)   SpO2 97%   BMI 35.13 kg/m   BP Readings from Last 3 Encounters:  04/05/18 (!) 160/102  03/24/18 (!) 156/98  03/18/18 (!) 140/100    Wt Readings from Last 3 Encounters:  04/05/18 259 lb (117.5 kg)  03/24/18 251 lb (113.9 kg)  03/18/18 255 lb (115.7 kg)    Physical Exam  Constitutional: He is oriented to person, place, and time. No distress.  HENT:  Mouth/Throat: Oropharynx is clear and moist. No oropharyngeal exudate.  Eyes: Conjunctivae are normal. No scleral icterus.  Neck: Normal range of motion. Neck supple. No JVD present. No thyromegaly present.  Cardiovascular: Normal rate, regular rhythm and normal  heart sounds. Exam reveals no gallop and no friction rub.  No murmur heard. Pulmonary/Chest: Effort normal and breath sounds normal. No respiratory distress. He has no wheezes. He has no rhonchi. He has no rales.  Abdominal: Soft. Normal appearance and bowel sounds are normal. He exhibits no mass. There is no hepatosplenomegaly. There is no tenderness.  Musculoskeletal: Normal range of motion. He exhibits no edema, tenderness or deformity.  Lymphadenopathy:    He has no cervical adenopathy.  Neurological: He is alert and oriented to person, place,  and time. He displays normal reflexes. No cranial nerve deficit or sensory deficit. He exhibits normal muscle tone. Coordination normal.  Reflex Scores:      Tricep reflexes are 0 on the right side and 0 on the left side.      Bicep reflexes are 0 on the right side and 0 on the left side.      Brachioradialis reflexes are 0 on the right side and 0 on the left side.      Patellar reflexes are 0 on the right side and 0 on the left side.      Achilles reflexes are 0 on the right side and 0 on the left side. + SLR in LLE - SLR in RLE  Skin: Skin is warm and dry. No rash noted. He is not diaphoretic.  Vitals reviewed.   Lab Results  Component Value Date   WBC 7.9 10/29/2017   HGB 15.6 10/29/2017   HCT 45.5 10/29/2017   PLT 212.0 10/29/2017   GLUCOSE 85 04/05/2018   CHOL 168 10/29/2017   TRIG 110.0 10/29/2017   HDL 72.00 10/29/2017   LDLDIRECT 89.0 10/19/2016   LDLCALC 74 10/29/2017   ALT 16 10/29/2017   AST 18 10/29/2017   NA 142 04/05/2018   K 3.9 04/05/2018   CL 101 04/05/2018   CREATININE 1.16 04/05/2018   BUN 20 04/05/2018   CO2 31 04/05/2018   TSH 0.74 10/29/2017   PSA 1.95 10/29/2017   HGBA1C 5.4 10/19/2016    Dg Lumbar Spine 2-3 Views  Result Date: 02/14/2018 CLINICAL DATA:  62 year old male with left lower leg numbness for 3 days. Initial encounter. EXAM: LUMBAR SPINE - 2-3 VIEW COMPARISON:  None. FINDINGS: Slight straightening of the lumbar spine. Mild L1-2 and L3-4 disc space narrowing. Minimal L2-3, L4-5 and L5-S1 disc space narrowing. Minimal T11-12 disc space narrowing. No compression fracture. Vascular calcifications. Large gallstone. IMPRESSION: Mild L1-2 and L3-4 disc space narrowing. Minimal L2-3, L4-5 and L5-S1 disc space narrowing. Aortic Atherosclerosis (ICD10-I70.0). Electronically Signed   By: Genia Del M.D.   On: 02/14/2018 18:55    Assessment & Plan:   Henry Figueroa was seen today for hypertension and back pain.  Diagnoses and all orders for this  visit:  Essential hypertension, benign- His blood pressure is not adequately well controlled.  I will upgrade him to a more potent ARB and thiazide diuretic.  Will restart nebivolol.  Will be more aggressive about treating the vitamin D deficiency. -     Basic metabolic panel; Future -     VITAMIN D 25 Hydroxy (Vit-D Deficiency, Fractures); Future -     nebivolol (BYSTOLIC) 5 MG tablet; Take 1 tablet (5 mg total) by mouth daily. -     Azilsartan-Chlorthalidone (EDARBYCLOR) 40-25 MG TABS; Take 1 tablet by mouth daily.   Vitamin D deficiency- His vitamin D level is still low.  I will increase the dose of his vitamin D supplement. -  VITAMIN D 25 Hydroxy (Vit-D Deficiency, Fractures); Future -     Cholecalciferol 50000 units capsule; Take 1 capsule (50,000 Units total) by mouth once a week.  Left lumbar radiculitis -     MR Lumbar Spine Wo Contrast; Future   I have discontinued Henry Figueroa's Cholecalciferol, valsartan-hydrochlorothiazide, and meloxicam. I am also having him start on Azilsartan-Chlorthalidone and Cholecalciferol. Additionally, I am having him maintain his levocetirizine, rosuvastatin, traMADol, gabapentin, and nebivolol.  Meds ordered this encounter  Medications  . nebivolol (BYSTOLIC) 5 MG tablet    Sig: Take 1 tablet (5 mg total) by mouth daily.    Dispense:  84 tablet    Refill:  0  . Azilsartan-Chlorthalidone (EDARBYCLOR) 40-25 MG TABS    Sig: Take 1 tablet by mouth daily.    Dispense:  70 tablet    Refill:  0  . Cholecalciferol 50000 units capsule    Sig: Take 1 capsule (50,000 Units total) by mouth once a week.    Dispense:  12 capsule    Refill:  1     Follow-up: Return in about 2 months (around 06/05/2018).  Henry Calico, MD

## 2018-04-05 NOTE — Patient Instructions (Signed)

## 2018-04-14 ENCOUNTER — Other Ambulatory Visit: Payer: Self-pay | Admitting: Neurosurgery

## 2018-04-19 NOTE — Progress Notes (Addendum)
PCP: Scarlette Calico, MD  Cardiologist: pt denies  EKG: 10/29/17 in [facility]  Stress test: 12/06/17 in [facility]  ECHO: pt denies  Cardiac Cath: pt denies  Chest x-ray: pt denies past year, no recent respiratory infections/complications

## 2018-04-19 NOTE — Pre-Procedure Instructions (Signed)
Henry Figueroa  04/19/2018      [id number] - [place], [place] - [address] AT [address] [place] [address] Phone: [phone] Fax: [phone]    Your procedure is scheduled on April 22, 2018.  Report to [facility] Admitting at 1:00 PM.  Call this number if you have problems the morning of surgery:  [phone]   Remember:  Do not eat or drink after midnight.    Take these medicines the morning of surgery with A SIP OF WATER  nebivolol (bystolic) Tylenol-if needed Gabapentin (neuronitin) Tramadol (ultram)-if needed  7 days prior to surgery STOP taking any Aspirin (unless otherwise instructed by your surgeon), Aleve, Naproxen, Ibuprofen, Motrin, Advil, Goody's, BC's, all herbal medications, fish oil, and all vitamins    Do not wear jewelry, make-up or nail polish.  Do not wear lotions, powders, or perfumes, or deodorant.  Do not shave 48 hours prior to surgery.  Men may shave face and neck.  Do not bring valuables to the hospital.  [facility] is not responsible for any belongings or valuables.  Contacts, dentures or bridgework may not be worn into surgery.  Leave your suitcase in the car.  After surgery it may be brought to your room.  For patients admitted to the hospital, discharge time will be determined by your treatment team.  Patients discharged the day of surgery will not be allowed to drive home.    [facility]- Preparing For Surgery  Before surgery, you can play an important role. Because skin is not sterile, your skin needs to be as free of germs as possible. You can reduce the number of germs on your skin by washing with CHG (chlorahexidine gluconate) Soap before surgery.  CHG is an antiseptic cleaner which kills germs and bonds with the skin to continue killing germs even after washing.    Oral Hygiene is also important to reduce your risk of infection.  Remember - BRUSH YOUR  TEETH THE MORNING OF SURGERY WITH YOUR REGULAR TOOTHPASTE  Please do not use if you have an allergy to CHG or antibacterial soaps. If your skin becomes reddened/irritated stop using the CHG.  Do not shave (including legs and underarms) for at least 48 hours prior to first CHG shower. It is OK to shave your face.  Please follow these instructions carefully.   1. Shower the NIGHT BEFORE SURGERY and the MORNING OF SURGERY with CHG.   2. If you chose to wash your hair, wash your hair first as usual with your normal shampoo.  3. After you shampoo, rinse your hair and body thoroughly to remove the shampoo.  4. Use CHG as you would any other liquid soap. You can apply CHG directly to the skin and wash gently with a scrungie or a clean washcloth.   5. Apply the CHG Soap to your body ONLY FROM THE NECK DOWN.  Do not use on open wounds or open sores. Avoid contact with your eyes, ears, mouth and genitals (private parts). Wash Face and genitals (private parts)  with your normal soap.  6. Wash thoroughly, paying special attention to the area where your surgery will be performed.  7. Thoroughly rinse your body with warm water from the neck down.  8. DO NOT shower/wash with your normal soap after using and rinsing off the CHG Soap.  9. Pat yourself dry with a CLEAN TOWEL.  10. Wear CLEAN PAJAMAS to bed the night  before surgery, wear comfortable clothes the morning of surgery  11. Place CLEAN SHEETS on your bed the night of your first shower and DO NOT SLEEP WITH PETS.  Day of Surgery:  Do not apply any deodorants/lotions.  Please wear clean clothes to the hospital/surgery center.   Remember to brush your teeth WITH YOUR REGULAR TOOTHPASTE.   Please read over the following fact sheets that you were given.

## 2018-04-20 ENCOUNTER — Encounter (HOSPITAL_COMMUNITY)
Admission: RE | Admit: 2018-04-20 | Discharge: 2018-04-20 | Disposition: A | Discharge status: Home / Self Care | Payer: Managed Care, Other (non HMO) | Source: Ambulatory Visit | Attending: Neurosurgery | Admitting: Neurosurgery

## 2018-04-20 ENCOUNTER — Encounter (HOSPITAL_COMMUNITY): Payer: Self-pay

## 2018-04-20 ENCOUNTER — Other Ambulatory Visit: Payer: Self-pay

## 2018-04-20 DIAGNOSIS — M4727 Other spondylosis with radiculopathy, lumbosacral region: Secondary | ICD-10-CM | POA: Diagnosis not present

## 2018-04-20 DIAGNOSIS — K219 Gastro-esophageal reflux disease without esophagitis: Secondary | ICD-10-CM | POA: Diagnosis not present

## 2018-04-20 DIAGNOSIS — Z85828 Personal history of other malignant neoplasm of skin: Secondary | ICD-10-CM | POA: Insufficient documentation

## 2018-04-20 DIAGNOSIS — E669 Obesity, unspecified: Secondary | ICD-10-CM | POA: Diagnosis not present

## 2018-04-20 DIAGNOSIS — I4510 Unspecified right bundle-branch block: Secondary | ICD-10-CM | POA: Diagnosis not present

## 2018-04-20 DIAGNOSIS — Z79899 Other long term (current) drug therapy: Secondary | ICD-10-CM | POA: Insufficient documentation

## 2018-04-20 DIAGNOSIS — G4733 Obstructive sleep apnea (adult) (pediatric): Secondary | ICD-10-CM | POA: Insufficient documentation

## 2018-04-20 DIAGNOSIS — Z9119 Patient's noncompliance with other medical treatment and regimen: Secondary | ICD-10-CM | POA: Diagnosis not present

## 2018-04-20 DIAGNOSIS — M4317 Spondylolisthesis, lumbosacral region: Secondary | ICD-10-CM | POA: Diagnosis not present

## 2018-04-20 DIAGNOSIS — Z01812 Encounter for preprocedural laboratory examination: Secondary | ICD-10-CM | POA: Insufficient documentation

## 2018-04-20 DIAGNOSIS — I10 Essential (primary) hypertension: Secondary | ICD-10-CM | POA: Diagnosis not present

## 2018-04-20 DIAGNOSIS — M5117 Intervertebral disc disorders with radiculopathy, lumbosacral region: Secondary | ICD-10-CM | POA: Diagnosis not present

## 2018-04-20 DIAGNOSIS — I351 Nonrheumatic aortic (valve) insufficiency: Secondary | ICD-10-CM | POA: Diagnosis not present

## 2018-04-20 DIAGNOSIS — Z6834 Body mass index (BMI) 34.0-34.9, adult: Secondary | ICD-10-CM | POA: Diagnosis not present

## 2018-04-20 DIAGNOSIS — F17200 Nicotine dependence, unspecified, uncomplicated: Secondary | ICD-10-CM

## 2018-04-20 DIAGNOSIS — I483 Typical atrial flutter: Secondary | ICD-10-CM | POA: Diagnosis not present

## 2018-04-20 DIAGNOSIS — I480 Paroxysmal atrial fibrillation: Secondary | ICD-10-CM | POA: Diagnosis not present

## 2018-04-20 DIAGNOSIS — F17210 Nicotine dependence, cigarettes, uncomplicated: Secondary | ICD-10-CM | POA: Diagnosis not present

## 2018-04-20 DIAGNOSIS — M5126 Other intervertebral disc displacement, lumbar region: Secondary | ICD-10-CM

## 2018-04-20 DIAGNOSIS — I4891 Unspecified atrial fibrillation: Secondary | ICD-10-CM | POA: Diagnosis not present

## 2018-04-20 HISTORY — DX: Sleep apnea, unspecified: G47.30

## 2018-04-20 LAB — BASIC METABOLIC PANEL
Anion gap: 14 (ref 5–15)
BUN: 20 mg/dL (ref 8–23)
CALCIUM: 10.1 mg/dL (ref 8.9–10.3)
CO2: 26 mmol/L (ref 22–32)
Chloride: 100 mmol/L (ref 98–111)
Creatinine, Ser: 1.03 mg/dL (ref 0.61–1.24)
GFR calc non Af Amer: >60 mL/min (ref >60)
Glucose, Bld: 95 mg/dL (ref 70–99)
Potassium: 3.5 mmol/L (ref 3.5–5.1)
SODIUM: 140 mmol/L (ref 135–145)

## 2018-04-20 LAB — CBC
HCT: 49.5 % (ref 39.0–52.0)
Hemoglobin: 16.8 g/dL (ref 13.0–17.0)
MCH: 31.8 pg (ref 26.0–34.0)
MCHC: 33.9 g/dL (ref 30.0–36.0)
MCV: 93.8 fL (ref 78.0–100.0)
PLATELETS: 207 10*3/uL (ref 150–400)
RBC: 5.28 MIL/uL (ref 4.22–5.81)
RDW: 12.7 % (ref 11.5–15.5)
WBC: 6.6 10*3/uL (ref 4.0–10.5)

## 2018-04-20 LAB — SURGICAL PCR SCREEN
MRSA, PCR: NEGATIVE
Staphylococcus aureus: NEGATIVE

## 2018-04-21 NOTE — Progress Notes (Signed)
Anesthesia Chart Review:  Case:  [id number] Date/Time:  04/22/18 1448   Procedure:  Left Lumbar 5 Sacral 1 Microdiscectomy (Left ) - Left Lumbar 5 Sacral 1 Microdiscectomy   Anesthesia type:  General   Pre-op diagnosis:  Disc displacement, Lumbar   Location:  [facility] OR ROOM 21 / [place] OR   Surgeon:  Erline Levine, MD      DISCUSSION: 62 yo male current smoker. Pertinent hx includes HTN, OSA not on CPAP.  Pt had workup by PCP earlier this year for DOE. Underwent exercise tolerance test that was nondiagnostic due to poor exercise capacity and submax HR. He then had Lexiscan showing EF 54% with normal wall motion and no ST segment deviation; low risk study.  Anticipate he can proceed as planned barring acute status change.  VS: BP (!) 142/90   Pulse 98   Temp 36.8 C   Resp 20   Ht 6' (1.829 m)   Wt 114.3 kg   BMI 34.18 kg/m   PROVIDERS: Janith Lima, MD is PCP   LABS: Labs reviewed: Acceptable for surgery. (all labs ordered are listed, but only abnormal results are displayed)  Labs Reviewed  SURGICAL PCR SCREEN  BASIC METABOLIC PANEL  CBC     IMAGES: N/A   EKG: 10/29/2017: Sinus  Rhythm 77bpm Incomplete right bundle branch block.  CV: Lexiscan 12/06/2017:   Nuclear stress EF: 54%.  No T wave inversion was noted during stress.  There was no ST segment deviation noted during stress.  This is a low risk study.   Normal perfusion. LVEF 54% with normal wall motion. This is a low risk study.  Exercise tolerance test 11/24/2017:  Blood pressure demonstrated a hypertensive response to exercise.  There was no ST segment deviation noted during stress.  The patient exercised on a standard Bruce protocol for a total of 3 minutes 44 seconds achieving 74% maximal protected heart rate.  Poor exercise capacity achieving only 1.4 METS.  Patient complained of fatigue and shortness of breath  Nondiagnostic stress test due to submaximal heart rate response.  Recommend Lexiscan  Myoview or coronary CTA  Past Medical History:  Diagnosis Date  . Basal cell carcinoma    Back  . Hypertension   . Sleep apnea    has appointment in October 2019 to get CPAP    Past Surgical History:  Procedure Laterality Date  . KNEE SURGERY Right   . SHOULDER ARTHROSCOPY Left     MEDICATIONS: . acetaminophen (TYLENOL) 650 MG CR tablet  . Azilsartan-Chlorthalidone (EDARBYCLOR) 40-25 MG TABS  . Cholecalciferol 50000 units capsule  . gabapentin (NEURONTIN) 100 MG capsule  . levocetirizine (XYZAL) 5 MG tablet  . nebivolol (BYSTOLIC) 5 MG tablet  . rosuvastatin (CRESTOR) 10 MG tablet  . traMADol (ULTRAM) 50 MG tablet   No current facility-administered medications for this encounter.      Wynonia Musty [facility] Short Stay Center/Anesthesiology Phone [phone] 04/21/2018 8:36 AM

## 2018-04-22 ENCOUNTER — Observation Stay (HOSPITAL_COMMUNITY)
Admission: RE | Admit: 2018-04-22 | Discharge: 2018-04-23 | Disposition: A | Discharge status: Home / Self Care | Payer: Managed Care, Other (non HMO) | Source: Ambulatory Visit | Attending: Neurosurgery | Admitting: Neurosurgery

## 2018-04-22 ENCOUNTER — Ambulatory Visit (HOSPITAL_COMMUNITY): Payer: Managed Care, Other (non HMO)

## 2018-04-22 ENCOUNTER — Ambulatory Visit (HOSPITAL_COMMUNITY): Payer: Managed Care, Other (non HMO) | Admitting: Anesthesiology

## 2018-04-22 ENCOUNTER — Ambulatory Visit (HOSPITAL_COMMUNITY): Payer: Managed Care, Other (non HMO) | Admitting: Physician Assistant

## 2018-04-22 ENCOUNTER — Encounter (HOSPITAL_COMMUNITY): Payer: Self-pay | Admitting: Certified Registered Nurse Anesthetist

## 2018-04-22 ENCOUNTER — Encounter (HOSPITAL_COMMUNITY)
Admission: RE | Disposition: A | Discharge status: Home / Self Care | Payer: Self-pay | Source: Ambulatory Visit | Attending: Neurosurgery

## 2018-04-22 DIAGNOSIS — M5117 Intervertebral disc disorders with radiculopathy, lumbosacral region: Secondary | ICD-10-CM | POA: Diagnosis not present

## 2018-04-22 DIAGNOSIS — M4727 Other spondylosis with radiculopathy, lumbosacral region: Secondary | ICD-10-CM | POA: Insufficient documentation

## 2018-04-22 DIAGNOSIS — Z6834 Body mass index (BMI) 34.0-34.9, adult: Secondary | ICD-10-CM | POA: Insufficient documentation

## 2018-04-22 DIAGNOSIS — Z9119 Patient's noncompliance with other medical treatment and regimen: Secondary | ICD-10-CM | POA: Insufficient documentation

## 2018-04-22 DIAGNOSIS — I480 Paroxysmal atrial fibrillation: Secondary | ICD-10-CM | POA: Insufficient documentation

## 2018-04-22 DIAGNOSIS — G4733 Obstructive sleep apnea (adult) (pediatric): Secondary | ICD-10-CM | POA: Diagnosis not present

## 2018-04-22 DIAGNOSIS — I351 Nonrheumatic aortic (valve) insufficiency: Secondary | ICD-10-CM | POA: Insufficient documentation

## 2018-04-22 DIAGNOSIS — M5126 Other intervertebral disc displacement, lumbar region: Secondary | ICD-10-CM | POA: Diagnosis present

## 2018-04-22 DIAGNOSIS — M4317 Spondylolisthesis, lumbosacral region: Secondary | ICD-10-CM | POA: Insufficient documentation

## 2018-04-22 DIAGNOSIS — E669 Obesity, unspecified: Secondary | ICD-10-CM | POA: Insufficient documentation

## 2018-04-22 DIAGNOSIS — I4510 Unspecified right bundle-branch block: Secondary | ICD-10-CM | POA: Insufficient documentation

## 2018-04-22 DIAGNOSIS — Z419 Encounter for procedure for purposes other than remedying health state, unspecified: Secondary | ICD-10-CM

## 2018-04-22 DIAGNOSIS — I483 Typical atrial flutter: Secondary | ICD-10-CM | POA: Insufficient documentation

## 2018-04-22 DIAGNOSIS — F17210 Nicotine dependence, cigarettes, uncomplicated: Secondary | ICD-10-CM | POA: Insufficient documentation

## 2018-04-22 DIAGNOSIS — I10 Essential (primary) hypertension: Secondary | ICD-10-CM | POA: Insufficient documentation

## 2018-04-22 DIAGNOSIS — I4891 Unspecified atrial fibrillation: Secondary | ICD-10-CM | POA: Insufficient documentation

## 2018-04-22 DIAGNOSIS — Z85828 Personal history of other malignant neoplasm of skin: Secondary | ICD-10-CM | POA: Insufficient documentation

## 2018-04-22 DIAGNOSIS — Z79899 Other long term (current) drug therapy: Secondary | ICD-10-CM | POA: Insufficient documentation

## 2018-04-22 DIAGNOSIS — K219 Gastro-esophageal reflux disease without esophagitis: Secondary | ICD-10-CM | POA: Insufficient documentation

## 2018-04-22 HISTORY — PX: LUMBAR LAMINECTOMY/DECOMPRESSION MICRODISCECTOMY: SHX5026

## 2018-04-22 LAB — GLUCOSE, CAPILLARY: GLUCOSE-CAPILLARY: 148 mg/dL — AB (ref 70–99)

## 2018-04-22 SURGERY — LUMBAR LAMINECTOMY/DECOMPRESSION MICRODISCECTOMY 1 LEVEL
Anesthesia: General | Site: Spine Lumbar | Laterality: Left

## 2018-04-22 MED ORDER — ACETAMINOPHEN 325 MG PO TABS: 325.0000 mg | ORAL_TABLET | Freq: Three times a day (TID) | ORAL | Status: DC

## 2018-04-22 MED ORDER — HYDROMORPHONE HCL 1 MG/ML IJ SOLN
0.2500 mg | INTRAMUSCULAR | Status: DC | PRN
  Administered 2018-04-22 (×2): 0.5 mg via INTRAVENOUS

## 2018-04-22 MED ORDER — ACETAMINOPHEN 500 MG PO TABS
1000.0000 mg | ORAL_TABLET | Freq: Four times a day (QID) | ORAL | Status: DC
  Administered 2018-04-22 – 2018-04-23 (×2): 1000 mg via ORAL
  Filled 2018-04-22 (×3): qty 2

## 2018-04-22 MED ORDER — CEFAZOLIN SODIUM-DEXTROSE 2-4 GM/100ML-% IV SOLN
2.0000 g | INTRAVENOUS | Status: AC
  Administered 2018-04-22: 2 g via INTRAVENOUS

## 2018-04-22 MED ORDER — PROMETHAZINE HCL 25 MG/ML IJ SOLN: 6.2500 mg | INTRAMUSCULAR | Status: DC | PRN

## 2018-04-22 MED ORDER — HYDROCODONE-ACETAMINOPHEN 5-325 MG PO TABS
1.0000 | ORAL_TABLET | ORAL | Status: DC | PRN
  Administered 2018-04-22 – 2018-04-23 (×2): 1 via ORAL
  Filled 2018-04-22 (×2): qty 1

## 2018-04-22 MED ORDER — PHENOL 1.4 % MT LIQD: 1.0000 | OROMUCOSAL | Status: DC | PRN

## 2018-04-22 MED ORDER — SUGAMMADEX SODIUM 200 MG/2ML IV SOLN
INTRAVENOUS | Status: DC | PRN
  Administered 2018-04-22: 300 mg via INTRAVENOUS

## 2018-04-22 MED ORDER — HYDROCODONE-ACETAMINOPHEN 7.5-325 MG PO TABS
ORAL_TABLET | ORAL | Status: AC
  Filled 2018-04-22: qty 1

## 2018-04-22 MED ORDER — CHLORTHALIDONE 25 MG PO TABS
25.0000 mg | ORAL_TABLET | Freq: Every day | ORAL | Status: DC
  Administered 2018-04-22 – 2018-04-23 (×2): 25 mg via ORAL
  Filled 2018-04-22 (×2): qty 1

## 2018-04-22 MED ORDER — SODIUM CHLORIDE 0.9 % IV SOLN: 250.0000 mL | INTRAVENOUS | Status: DC

## 2018-04-22 MED ORDER — FENTANYL CITRATE (PF) 100 MCG/2ML IJ SOLN
INTRAMUSCULAR | Status: AC
  Filled 2018-04-22: qty 2

## 2018-04-22 MED ORDER — PHENYLEPHRINE 40 MCG/ML (10ML) SYRINGE FOR IV PUSH (FOR BLOOD PRESSURE SUPPORT)
PREFILLED_SYRINGE | INTRAVENOUS | Status: AC
  Filled 2018-04-22: qty 30

## 2018-04-22 MED ORDER — DOCUSATE SODIUM 100 MG PO CAPS
100.0000 mg | ORAL_CAPSULE | Freq: Two times a day (BID) | ORAL | Status: DC
  Administered 2018-04-22 – 2018-04-23 (×2): 100 mg via ORAL
  Filled 2018-04-22 (×2): qty 1

## 2018-04-22 MED ORDER — METHYLPREDNISOLONE ACETATE 80 MG/ML IJ SUSP
INTRAMUSCULAR | Status: AC
  Filled 2018-04-22: qty 1

## 2018-04-22 MED ORDER — MENTHOL 3 MG MT LOZG: 1.0000 | LOZENGE | OROMUCOSAL | Status: DC | PRN

## 2018-04-22 MED ORDER — LIDOCAINE 2% (20 MG/ML) 5 ML SYRINGE
INTRAMUSCULAR | Status: AC
  Filled 2018-04-22: qty 15

## 2018-04-22 MED ORDER — METHYLPREDNISOLONE ACETATE 80 MG/ML IJ SUSP
INTRAMUSCULAR | Status: DC | PRN
  Administered 2018-04-22: 80 mg

## 2018-04-22 MED ORDER — METHOCARBAMOL 500 MG PO TABS: 500.0000 mg | ORAL_TABLET | Freq: Four times a day (QID) | ORAL | Status: DC | PRN

## 2018-04-22 MED ORDER — DEXAMETHASONE SODIUM PHOSPHATE 10 MG/ML IJ SOLN
INTRAMUSCULAR | Status: AC
  Filled 2018-04-22: qty 1

## 2018-04-22 MED ORDER — BUPIVACAINE HCL (PF) 0.5 % IJ SOLN
INTRAMUSCULAR | Status: DC | PRN
  Administered 2018-04-22: 10 mL

## 2018-04-22 MED ORDER — PHENYLEPHRINE HCL 10 MG/ML IJ SOLN
INTRAMUSCULAR | Status: AC
  Filled 2018-04-22: qty 1

## 2018-04-22 MED ORDER — THROMBIN 5000 UNITS EX SOLR
OROMUCOSAL | Status: DC | PRN
  Administered 2018-04-22: 16:00:00 via TOPICAL

## 2018-04-22 MED ORDER — CEFAZOLIN SODIUM-DEXTROSE 2-4 GM/100ML-% IV SOLN
2.0000 g | Freq: Three times a day (TID) | INTRAVENOUS | Status: AC
  Administered 2018-04-22 – 2018-04-23 (×2): 2 g via INTRAVENOUS
  Filled 2018-04-22 (×2): qty 100

## 2018-04-22 MED ORDER — LIDOCAINE-EPINEPHRINE 1 %-1:100000 IJ SOLN
INTRAMUSCULAR | Status: DC | PRN
  Administered 2018-04-22: 10 mL

## 2018-04-22 MED ORDER — CHLORHEXIDINE GLUCONATE CLOTH 2 % EX PADS: 6.0000 | MEDICATED_PAD | Freq: Once | CUTANEOUS | Status: DC

## 2018-04-22 MED ORDER — VITAMIN D3 25 MCG PO TABS
1000.0000 [IU] | ORAL_TABLET | ORAL | Status: DC
  Administered 2018-04-22: 1000 [IU] via ORAL
  Filled 2018-04-22 (×2): qty 1

## 2018-04-22 MED ORDER — ONDANSETRON HCL 4 MG PO TABS: 4.0000 mg | ORAL_TABLET | Freq: Four times a day (QID) | ORAL | Status: DC | PRN

## 2018-04-22 MED ORDER — ONDANSETRON HCL 4 MG/2ML IJ SOLN
INTRAMUSCULAR | Status: DC | PRN
  Administered 2018-04-22: 4 mg via INTRAVENOUS

## 2018-04-22 MED ORDER — BISACODYL 10 MG RE SUPP: 10.0000 mg | Freq: Every day | RECTAL | Status: DC | PRN

## 2018-04-22 MED ORDER — LIDOCAINE-EPINEPHRINE 1 %-1:100000 IJ SOLN
INTRAMUSCULAR | Status: AC
  Filled 2018-04-22: qty 1

## 2018-04-22 MED ORDER — SODIUM CHLORIDE 0.9% FLUSH: 3.0000 mL | INTRAVENOUS | Status: DC | PRN

## 2018-04-22 MED ORDER — FENTANYL CITRATE (PF) 100 MCG/2ML IJ SOLN
INTRAMUSCULAR | Status: DC | PRN
  Administered 2018-04-22: 100 ug via INTRAVENOUS

## 2018-04-22 MED ORDER — BUPIVACAINE HCL (PF) 0.5 % IJ SOLN
INTRAMUSCULAR | Status: AC
  Filled 2018-04-22: qty 30

## 2018-04-22 MED ORDER — SUCCINYLCHOLINE CHLORIDE 200 MG/10ML IV SOSY
PREFILLED_SYRINGE | INTRAVENOUS | Status: AC
  Filled 2018-04-22: qty 10

## 2018-04-22 MED ORDER — AZILSARTAN-CHLORTHALIDONE 40-25 MG PO TABS
1.0000 | ORAL_TABLET | Freq: Every day | ORAL | Status: DC
  Administered 2018-04-22: 1 via ORAL

## 2018-04-22 MED ORDER — LACTATED RINGERS IV SOLN
INTRAVENOUS | Status: DC | PRN
  Administered 2018-04-22 (×2): via INTRAVENOUS

## 2018-04-22 MED ORDER — MIDAZOLAM HCL 2 MG/2ML IJ SOLN
INTRAMUSCULAR | Status: AC
  Filled 2018-04-22: qty 2

## 2018-04-22 MED ORDER — FLEET ENEMA 7-19 GM/118ML RE ENEM: 1.0000 | ENEMA | Freq: Once | RECTAL | Status: DC | PRN

## 2018-04-22 MED ORDER — ONDANSETRON HCL 4 MG/2ML IJ SOLN: 4.0000 mg | Freq: Four times a day (QID) | INTRAMUSCULAR | Status: DC | PRN

## 2018-04-22 MED ORDER — DOUBLE ANTIBIOTIC 500-10000 UNIT/GM EX OINT
TOPICAL_OINTMENT | CUTANEOUS | Status: AC
  Filled 2018-04-22: qty 1

## 2018-04-22 MED ORDER — KETOROLAC TROMETHAMINE 15 MG/ML IJ SOLN
15.0000 mg | Freq: Four times a day (QID) | INTRAMUSCULAR | Status: AC
  Administered 2018-04-22 – 2018-04-23 (×4): 15 mg via INTRAVENOUS
  Filled 2018-04-22 (×4): qty 1

## 2018-04-22 MED ORDER — SODIUM CHLORIDE 0.9% FLUSH
3.0000 mL | Freq: Two times a day (BID) | INTRAVENOUS | Status: DC
  Administered 2018-04-22: 3 mL via INTRAVENOUS

## 2018-04-22 MED ORDER — PROPOFOL 10 MG/ML IV BOLUS
INTRAVENOUS | Status: DC | PRN
  Administered 2018-04-22: 50 mg via INTRAVENOUS
  Administered 2018-04-22: 150 mg via INTRAVENOUS

## 2018-04-22 MED ORDER — ROCURONIUM BROMIDE 100 MG/10ML IV SOLN
INTRAVENOUS | Status: DC | PRN
  Administered 2018-04-22: 30 mg via INTRAVENOUS
  Administered 2018-04-22: 50 mg via INTRAVENOUS
  Administered 2018-04-22: 20 mg via INTRAVENOUS

## 2018-04-22 MED ORDER — HYDROCODONE-ACETAMINOPHEN 7.5-325 MG PO TABS
1.0000 | ORAL_TABLET | Freq: Once | ORAL | Status: AC | PRN
  Administered 2018-04-22: 1 via ORAL

## 2018-04-22 MED ORDER — ROSUVASTATIN CALCIUM 10 MG PO TABS
10.0000 mg | ORAL_TABLET | Freq: Every day | ORAL | Status: DC
  Administered 2018-04-22 – 2018-04-23 (×2): 10 mg via ORAL
  Filled 2018-04-22 (×2): qty 1

## 2018-04-22 MED ORDER — MEPERIDINE HCL 50 MG/ML IJ SOLN: 6.2500 mg | INTRAMUSCULAR | Status: DC | PRN

## 2018-04-22 MED ORDER — 0.9 % SODIUM CHLORIDE (POUR BTL) OPTIME
TOPICAL | Status: DC | PRN
  Administered 2018-04-22: 1000 mL

## 2018-04-22 MED ORDER — DEXAMETHASONE SODIUM PHOSPHATE 10 MG/ML IJ SOLN
INTRAMUSCULAR | Status: DC | PRN
  Administered 2018-04-22: 10 mg via INTRAVENOUS

## 2018-04-22 MED ORDER — FAMOTIDINE IN NACL 20-0.9 MG/50ML-% IV SOLN
20.0000 mg | Freq: Two times a day (BID) | INTRAVENOUS | Status: DC
  Administered 2018-04-22: 20 mg via INTRAVENOUS
  Filled 2018-04-22: qty 50

## 2018-04-22 MED ORDER — ACETAMINOPHEN 10 MG/ML IV SOLN
INTRAVENOUS | Status: DC | PRN
  Administered 2018-04-22: 1000 mg via INTRAVENOUS

## 2018-04-22 MED ORDER — PROPOFOL 10 MG/ML IV BOLUS
INTRAVENOUS | Status: AC
  Filled 2018-04-22: qty 20

## 2018-04-22 MED ORDER — TRAMADOL HCL 50 MG PO TABS
50.0000 mg | ORAL_TABLET | Freq: Four times a day (QID) | ORAL | Status: DC | PRN
  Administered 2018-04-23: 50 mg via ORAL
  Filled 2018-04-22: qty 1

## 2018-04-22 MED ORDER — FENTANYL CITRATE (PF) 250 MCG/5ML IJ SOLN
INTRAMUSCULAR | Status: AC
  Filled 2018-04-22: qty 5

## 2018-04-22 MED ORDER — IRBESARTAN 300 MG PO TABS
300.0000 mg | ORAL_TABLET | Freq: Every day | ORAL | Status: DC
  Administered 2018-04-22 – 2018-04-23 (×2): 300 mg via ORAL
  Filled 2018-04-22 (×2): qty 1

## 2018-04-22 MED ORDER — MUPIROCIN 2 % EX OINT
TOPICAL_OINTMENT | CUTANEOUS | Status: AC
  Filled 2018-04-22: qty 22

## 2018-04-22 MED ORDER — ACETAMINOPHEN 10 MG/ML IV SOLN
INTRAVENOUS | Status: AC
  Filled 2018-04-22: qty 100

## 2018-04-22 MED ORDER — ACETAMINOPHEN 325 MG PO TABS: 650.0000 mg | ORAL_TABLET | ORAL | Status: DC | PRN

## 2018-04-22 MED ORDER — KCL IN DEXTROSE-NACL 20-5-0.45 MEQ/L-%-% IV SOLN
INTRAVENOUS | Status: DC
  Administered 2018-04-22: 21:00:00 via INTRAVENOUS
  Filled 2018-04-22: qty 1000

## 2018-04-22 MED ORDER — PHENYLEPHRINE HCL 10 MG/ML IJ SOLN
INTRAMUSCULAR | Status: DC | PRN
  Administered 2018-04-22 (×3): 80 ug via INTRAVENOUS

## 2018-04-22 MED ORDER — CELECOXIB 200 MG PO CAPS
200.0000 mg | ORAL_CAPSULE | Freq: Two times a day (BID) | ORAL | Status: DC
  Administered 2018-04-22 – 2018-04-23 (×2): 200 mg via ORAL
  Filled 2018-04-22 (×2): qty 1

## 2018-04-22 MED ORDER — ROCURONIUM BROMIDE 50 MG/5ML IV SOSY
PREFILLED_SYRINGE | INTRAVENOUS | Status: AC
  Filled 2018-04-22: qty 15

## 2018-04-22 MED ORDER — ACETAMINOPHEN 10 MG/ML IV SOLN: 1000.0000 mg | Freq: Once | INTRAVENOUS | Status: DC | PRN

## 2018-04-22 MED ORDER — MIDAZOLAM HCL 5 MG/5ML IJ SOLN
INTRAMUSCULAR | Status: DC | PRN
  Administered 2018-04-22: 2 mg via INTRAVENOUS

## 2018-04-22 MED ORDER — ZOLPIDEM TARTRATE 5 MG PO TABS
5.0000 mg | ORAL_TABLET | Freq: Every evening | ORAL | Status: DC | PRN
  Administered 2018-04-23: 5 mg via ORAL
  Filled 2018-04-22: qty 1

## 2018-04-22 MED ORDER — ALUM & MAG HYDROXIDE-SIMETH 200-200-20 MG/5ML PO SUSP: 30.0000 mL | Freq: Four times a day (QID) | ORAL | Status: DC | PRN

## 2018-04-22 MED ORDER — FENTANYL CITRATE (PF) 100 MCG/2ML IJ SOLN
INTRAMUSCULAR | Status: DC | PRN
  Administered 2018-04-22 (×2): 100 ug via INTRAVENOUS
  Administered 2018-04-22: 50 ug via INTRAVENOUS

## 2018-04-22 MED ORDER — NEBIVOLOL HCL 5 MG PO TABS
5.0000 mg | ORAL_TABLET | Freq: Every day | ORAL | Status: DC
  Administered 2018-04-22 – 2018-04-23 (×2): 5 mg via ORAL
  Filled 2018-04-22 (×3): qty 1

## 2018-04-22 MED ORDER — GABAPENTIN 100 MG PO CAPS
200.0000 mg | ORAL_CAPSULE | Freq: Every morning | ORAL | Status: DC
  Administered 2018-04-23: 200 mg via ORAL
  Filled 2018-04-22: qty 2

## 2018-04-22 MED ORDER — ACETAMINOPHEN 650 MG RE SUPP: 650.0000 mg | RECTAL | Status: DC | PRN

## 2018-04-22 MED ORDER — LIDOCAINE HCL (CARDIAC) PF 100 MG/5ML IV SOSY
PREFILLED_SYRINGE | INTRAVENOUS | Status: DC | PRN
  Administered 2018-04-22: 100 mg via INTRAVENOUS

## 2018-04-22 MED ORDER — SODIUM CHLORIDE 0.9 % IV SOLN
INTRAVENOUS | Status: DC | PRN
  Administered 2018-04-22: 40 ug/min via INTRAVENOUS

## 2018-04-22 MED ORDER — LORATADINE 10 MG PO TABS
10.0000 mg | ORAL_TABLET | Freq: Every day | ORAL | Status: DC
  Administered 2018-04-22 – 2018-04-23 (×2): 10 mg via ORAL
  Filled 2018-04-22 (×2): qty 1

## 2018-04-22 MED ORDER — THROMBIN 5000 UNITS EX SOLR
CUTANEOUS | Status: AC
  Filled 2018-04-22: qty 5000

## 2018-04-22 MED ORDER — LEVOCETIRIZINE DIHYDROCHLORIDE 5 MG PO TABS: 5.0000 mg | ORAL_TABLET | Freq: Every evening | ORAL | Status: DC

## 2018-04-22 MED ORDER — ONDANSETRON HCL 4 MG/2ML IJ SOLN
INTRAMUSCULAR | Status: AC
  Filled 2018-04-22: qty 4

## 2018-04-22 MED ORDER — METHOCARBAMOL 1000 MG/10ML IJ SOLN
500.0000 mg | Freq: Four times a day (QID) | INTRAVENOUS | Status: DC | PRN
  Filled 2018-04-22: qty 5

## 2018-04-22 MED ORDER — POLYETHYLENE GLYCOL 3350 17 G PO PACK: 17.0000 g | PACK | Freq: Every day | ORAL | Status: DC | PRN

## 2018-04-22 MED ORDER — HYDROMORPHONE HCL 1 MG/ML IJ SOLN
INTRAMUSCULAR | Status: AC
  Filled 2018-04-22: qty 1

## 2018-04-22 SURGICAL SUPPLY — 52 items
BLADE CLIPPER SURG (BLADE) IMPLANT
BUR MATCHSTICK NEURO 3.0 LAGG (BURR) ×3 IMPLANT
BUR ROUND FLUTED 5 RND (BURR) ×2 IMPLANT
BUR ROUND FLUTED 5MM RND (BURR) ×1
CANISTER SUCT 3000ML PPV (MISCELLANEOUS) ×3 IMPLANT
CARTRIDGE OIL MAESTRO DRILL (MISCELLANEOUS) ×1 IMPLANT
DECANTER SPIKE VIAL GLASS SM (MISCELLANEOUS) ×3 IMPLANT
DERMABOND ADVANCED (GAUZE/BANDAGES/DRESSINGS) ×2
DERMABOND ADVANCED .7 DNX12 (GAUZE/BANDAGES/DRESSINGS) ×1 IMPLANT
DIFFUSER DRILL AIR PNEUMATIC (MISCELLANEOUS) ×3 IMPLANT
DRAPE LAPAROTOMY 100X72X124 (DRAPES) ×3 IMPLANT
DRAPE MICROSCOPE LEICA (MISCELLANEOUS) ×3 IMPLANT
DRAPE SURG 17X23 STRL (DRAPES) ×3 IMPLANT
DRSG OPSITE POSTOP 4X6 (GAUZE/BANDAGES/DRESSINGS) ×3 IMPLANT
DURAPREP 26ML APPLICATOR (WOUND CARE) ×3 IMPLANT
ELECT REM PT RETURN 9FT ADLT (ELECTROSURGICAL) ×3
ELECTRODE REM PT RTRN 9FT ADLT (ELECTROSURGICAL) ×1 IMPLANT
GAUZE 4X4 16PLY RFD (DISPOSABLE) IMPLANT
GAUZE SPONGE 4X4 12PLY STRL (GAUZE/BANDAGES/DRESSINGS) IMPLANT
GLOVE BIO SURGEON STRL SZ8 (GLOVE) ×3 IMPLANT
GLOVE BIOGEL PI IND STRL 8 (GLOVE) ×1 IMPLANT
GLOVE BIOGEL PI IND STRL 8.5 (GLOVE) ×1 IMPLANT
GLOVE BIOGEL PI INDICATOR 8 (GLOVE) ×2
GLOVE BIOGEL PI INDICATOR 8.5 (GLOVE) ×2
GLOVE ECLIPSE 8.0 STRL XLNG CF (GLOVE) ×3 IMPLANT
GLOVE EXAM NITRILE LRG STRL (GLOVE) IMPLANT
GLOVE EXAM NITRILE XL STR (GLOVE) IMPLANT
GLOVE EXAM NITRILE XS STR PU (GLOVE) IMPLANT
GOWN STRL REUS W/ TWL LRG LVL3 (GOWN DISPOSABLE) IMPLANT
GOWN STRL REUS W/ TWL XL LVL3 (GOWN DISPOSABLE) ×1 IMPLANT
GOWN STRL REUS W/TWL 2XL LVL3 (GOWN DISPOSABLE) ×3 IMPLANT
GOWN STRL REUS W/TWL LRG LVL3 (GOWN DISPOSABLE)
GOWN STRL REUS W/TWL XL LVL3 (GOWN DISPOSABLE) ×2
HEMOSTAT POWDER KIT SURGIFOAM (HEMOSTASIS) ×3 IMPLANT
KIT BASIN OR (CUSTOM PROCEDURE TRAY) ×3 IMPLANT
KIT TURNOVER KIT B (KITS) ×3 IMPLANT
NEEDLE HYPO 18GX1.5 BLUNT FILL (NEEDLE) IMPLANT
NEEDLE HYPO 25X1 1.5 SAFETY (NEEDLE) ×3 IMPLANT
NS IRRIG 1000ML POUR BTL (IV SOLUTION) ×3 IMPLANT
OIL CARTRIDGE MAESTRO DRILL (MISCELLANEOUS) ×3
PACK LAMINECTOMY NEURO (CUSTOM PROCEDURE TRAY) ×3 IMPLANT
PAD ARMBOARD 7.5X6 YLW CONV (MISCELLANEOUS) ×9 IMPLANT
RUBBERBAND STERILE (MISCELLANEOUS) ×6 IMPLANT
SPONGE SURGIFOAM ABS GEL SZ50 (HEMOSTASIS) IMPLANT
SUT VIC AB 0 CT1 18XCR BRD8 (SUTURE) ×1 IMPLANT
SUT VIC AB 0 CT1 8-18 (SUTURE) ×2
SUT VIC AB 2-0 CT1 18 (SUTURE) ×3 IMPLANT
SUT VIC AB 3-0 SH 8-18 (SUTURE) ×3 IMPLANT
SYR 5ML LL (SYRINGE) IMPLANT
TOWEL GREEN STERILE (TOWEL DISPOSABLE) ×3 IMPLANT
TOWEL GREEN STERILE FF (TOWEL DISPOSABLE) ×3 IMPLANT
WATER STERILE IRR 1000ML POUR (IV SOLUTION) ×3 IMPLANT

## 2018-04-22 NOTE — Anesthesia Postprocedure Evaluation (Signed)
Anesthesia Post Note  Patient: Henry Figueroa  Procedure(s) Performed: Left Lumbar Five-Sacral One Microdiscectomy (Left Spine Lumbar)     Patient location during evaluation: PACU Anesthesia Type: General Level of consciousness: awake and alert Pain management: pain level controlled Vital Signs Assessment: post-procedure vital signs reviewed and stable Respiratory status: spontaneous breathing, nonlabored ventilation and respiratory function stable Cardiovascular status: blood pressure returned to baseline and stable Postop Assessment: no apparent nausea or vomiting Anesthetic complications: no    Last Vitals:  Vitals:   04/22/18 1730 04/22/18 1800  BP: 134/88   Pulse: 74 73  Resp: 18 16  Temp:    SpO2: 93% 96%    Last Pain:  Vitals:   04/22/18 1730  TempSrc:   PainSc: Asleep                 Nyasha Rahilly,W. EDMOND

## 2018-04-22 NOTE — Op Note (Signed)
04/22/2018  5:05 PM  PATIENT:  Henry Figueroa  62 y.o. 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, DDD, radiculopathy, lumbago   PROCEDURE:  Procedure(s) with comments: Left Lumbar Five-Sacral One Microdiscectomy (Left) - Left Lumbar 5 Sacral 1 Microdiscectomy  SURGEON:  Surgeon(s) and Role:    Erline Levine, MD - Primary    * Consuella Lose, MD - Assisting  PHYSICIAN ASSISTANT:   ASSISTANTS: Poteat, RN   ANESTHESIA:   general  EBL:  50 mL   BLOOD ADMINISTERED:none  DRAINS: none   LOCAL MEDICATIONS USED:  MARCAINE    and LIDOCAINE   SPECIMEN:  No Specimen  DISPOSITION OF SPECIMEN:  N/A  COUNTS:  YES  TOURNIQUET:  * No tourniquets in log *  DICTATION: Patient has a large L 5 S 1 disc rupture on the left with significant left leg weakness. It was elected to take him to surgery for left L 5 S 1 microdiscectomy.  Procedure: Patient was brought to the operating room and following the smooth and uncomplicated induction of general endotracheal anesthesia he was placed in a prone position on the Wilson frame. Low back was prepped and draped in the usual sterile fashion with betadine scrub and DuraPrep. Preoperative localizing X ray was obtained with a spinal needle.  Area of planned incision was infiltrated with local lidocaine. Incision was made in the midline and carried to the lumbodorsal fascia which was incised on the left side of midline. Subperiosteal dissection was performed exposing what was felt to be L 5S1 level. Intraoperative x-ray demonstrated marker probe at L5 1 .  A hemi-semi-laminectomy of L 5 was performed a high-speed drill and completed with Kerrison rongeurs and a generous foraminotomy was performed overlying the superior aspect of the L 5 lamina. Ligamentum flavum was detached and removed in a  piecemeal fashion and the L 4 nerve root was decompressed laterally with removal of the superior aspect of the facet and ligamentum causing nerve root compression. The microscope was brought into the field and the S 1nerve root was mobilized medially. This exposed a large amount of soft disc material and a superiorly migrated free fragment of herniated disc material. Multiple fragments were removed and these extended into the interspace which appeared to be quite soft with a disrupted annulus overlying the interspace. As a result it was elected to further decompress the interspace and remove loose disc material and this was done with a variety of pituitary rongeurs. The redundant annulus was also removed with 2 mm Kerrison rongeur.  At this point it was felt that all neural elements were well decompressed and there was no evidence of residual loose disc material within the interspace. The interspace was then irrigated with saline and no additional disc material was mobilized. Hemostasis was assured with bipolar electrocautery and the interspace was irrigated with Depo-Medrol and fentanyl. The lumbodorsal fascia was closed with 0 Vicryl sutures the subcutaneous tissues reapproximated 2-0 Vicryl inverted sutures and the skin edges were reapproximated with 3-0 Vicryl subcuticular stitch. The wound is dressed with Dermabond and an occlusive dressing. Patient was extubated in the operating room and taken to recovery in stable and satisfactory condition having tolerated his operation well counts were correct at the end of the case.   PLAN OF CARE: Admit for overnight observation  PATIENT DISPOSITION:  PACU - hemodynamically stable.  Delay start of Pharmacological VTE agent (>24hrs) due to surgical blood loss or risk of bleeding: yes

## 2018-04-22 NOTE — Progress Notes (Signed)
Awake, alert, conversant.  Currently in A. Fib.  Getting ECG.  Strength is full in his legs.  Leg pain is resolved.  To be evaluated by Cardiology.  Doing well from my standpoint.

## 2018-04-22 NOTE — Anesthesia Procedure Notes (Signed)
Procedure Name: Intubation Date/Time: 04/22/2018 2:59 PM Performed by: Cierra Rothgeb T, CRNA Pre-anesthesia Checklist: Patient identified, Emergency Drugs available, Suction available and Patient being monitored Patient Re-evaluated:Patient Re-evaluated prior to induction Oxygen Delivery Method: Circle system utilized Preoxygenation: Pre-oxygenation with 100% oxygen Induction Type: IV induction Ventilation: Mask ventilation without difficulty and Oral airway inserted - appropriate to patient size Laryngoscope Size: Miller and 3 Grade View: Grade III Tube type: Oral Tube size: 7.5 mm Number of attempts: 2 Airway Equipment and Method: Patient positioned with wedge pillow and Stylet Placement Confirmation: ETT inserted through vocal cords under direct vision,  positive ETCO2 and breath sounds checked- equal and bilateral Secured at: 23 cm Tube secured with: Tape Dental Injury: Teeth and Oropharynx as per pre-operative assessment  Difficulty Due To: Difficulty was anticipated, Difficult Airway- due to anterior larynx and Difficult Airway- due to limited oral opening Future Recommendations: Recommend- induction with short-acting agent, and alternative techniques readily available

## 2018-04-22 NOTE — Consult Note (Addendum)
Cardiology Consultation:   Patient ID: Henry Figueroa MRN: [id number]; DOB: 1956-04-21  Admit date: 04/22/2018 Date of Consult: 04/22/2018  Primary Care Provider: Janith Lima, MD Primary Cardiologist: New (Dr. Marlou Porch)  Patient Profile:   Henry Figueroa is a 62 y.o. male with a hx of HTN, OSA noncompliant w/ CPAP and DDD, admitted for lumbar surgery, L5-S1 microdisectomy, who is being seen today for the evaluation of perioperative atrial fibrillation/flutter, at the request of Dr. Vertell Limber, Neurosurgery.   History of Present Illness:   Henry Figueroa is a 62 y.o. male with a hx of HTN, OSA noncompliant w/ CPAP and DDD, admitted for lumbar surgery, L5-S1 microdisectomy, who is being seen today for the evaluation of perioperative atrial fibrillation, at the request of Dr. Vertell Limber, Neurosurgery.   He has no prior h/o afib or flutter, and no cardiac history. Only cardiac risk factor is HTN. He does have a h/o OSA but noncompliant w/ CPAP. It appears he had a NST 11/2017 that was negative for ischemia. He denies any recent history of CP, dyspnea or palpitations. No prior h/o CVA.   Per RN report he was monitored on tele during surgery and was initially in NSR but went into fib/ flutter w/ CVR during the case. No preop EKG right before surgery. Last EKG on file is from April and showed SR w/ Incomplete RBBB. Pt remains in afib currently with CVR in the 70s and asymptomatic. Pre surgical labs, including CBC and BMP WNL.   Post op EKG showed new atrial flutter w/ CVR 77 bpm w/ incomplete RBBB (old).   Past Medical History:  Diagnosis Date  . Basal cell carcinoma    Back  . Hypertension   . Sleep apnea    has appointment in October 2019 to get CPAP    Past Surgical History:  Procedure Laterality Date  . KNEE SURGERY Right   . SHOULDER ARTHROSCOPY Left      Home Medications:  Prior to Admission medications   Medication Sig Start Date End Date Taking? Authorizing Provider  acetaminophen  (TYLENOL) 650 MG CR tablet Take 650 mg by mouth 2 (two) times daily.   Yes [provider]  Azilsartan-Chlorthalidone (EDARBYCLOR) 40-25 MG TABS Take 1 tablet by mouth daily. 04/05/18  Yes Janith Lima, MD  Cholecalciferol 50000 units capsule Take 1 capsule (50,000 Units total) by mouth once a week. Patient taking differently: Take 50,000 Units by mouth every Friday.  04/05/18  Yes Janith Lima, MD  gabapentin (NEURONTIN) 100 MG capsule Take 2 capsules (200 mg total) by mouth at bedtime. Patient taking differently: Take 200 mg by mouth every morning.  03/17/18  Yes Janith Lima, MD  levocetirizine (XYZAL) 5 MG tablet Take 1 tablet (5 mg total) by mouth every evening. 10/29/17  Yes Janith Lima, MD  nebivolol (BYSTOLIC) 5 MG tablet Take 1 tablet (5 mg total) by mouth daily. 04/05/18  Yes Janith Lima, MD  rosuvastatin (CRESTOR) 10 MG tablet Take 1 tablet (10 mg total) by mouth daily. 10/31/17  Yes Janith Lima, MD  traMADol (ULTRAM) 50 MG tablet Take 1 tablet (50 mg total) by mouth every 12 (twelve) hours as needed. 03/15/18  Yes Janith Lima, MD    Inpatient Medications: Scheduled Meds: . Chlorhexidine Gluconate Cloth  6 each Topical Once   And  . Chlorhexidine Gluconate Cloth  6 each Topical Once  . HYDROmorphone       Continuous Infusions: . acetaminophen  PRN Meds: acetaminophen, HYDROcodone-acetaminophen, HYDROmorphone (DILAUDID) injection, meperidine (DEMEROL) injection, promethazine  Allergies:   No Known Allergies  Social History:   Social History   Socioeconomic History  . Marital status: Single    Spouse name: Not on file  . Number of children: Not on file  . Years of education: Not on file  . Highest education level: Not on file  Occupational History  . Not on file  Social Needs  . Financial resource strain: Not on file  . Food insecurity:    Worry: Not on file    Inability: Not on file  . Transportation needs:    Medical: Not on file     Non-medical: Not on file  Tobacco Use  . Smoking status: Light Tobacco Smoker    Types: Cigarettes    Start date: 10/29/2008  . Smokeless tobacco: Never Used  Substance and Sexual Activity  . Alcohol use: Yes    Alcohol/week: 3.0 standard drinks    Types: 3 Shots of liquor per week  . Drug use: No  . Sexual activity: Not Currently  Lifestyle  . Physical activity:    Days per week: Not on file    Minutes per session: Not on file  . Stress: Not on file  Relationships  . Social connections:    Talks on phone: Not on file    Gets together: Not on file    Attends religious service: Not on file    Active member of club or organization: Not on file    Attends meetings of clubs or organizations: Not on file    Relationship status: Not on file  . Intimate partner violence:    Fear of current or ex partner: Not on file    Emotionally abused: Not on file    Physically abused: Not on file    Forced sexual activity: Not on file  Other Topics Concern  . Not on file  Social History Narrative   Lives alone in a 2 story home.  Has no children.     Works as a Hotel manager.     Education: college degree.    Family History:    Family History  Problem Relation Age of Onset  . Alcohol abuse Father   . Dementia Father   . Cancer Neg Hx   . Depression Neg Hx   . Diabetes Neg Hx   . Drug abuse Neg Hx   . Early death Neg Hx   . Heart disease Neg Hx   . Hyperlipidemia Neg Hx   . Hypertension Neg Hx   . Kidney disease Neg Hx   . Stroke Neg Hx      ROS:  Please see the history of present illness.   All other ROS reviewed and negative.     Physical Exam/Data:   Vitals:   04/22/18 1320 04/22/18 1646 04/22/18 1700  BP: (!) 150/95 124/86 125/90  Pulse: 88 78 81  Resp: 18 20 15   Temp: 98 F (36.7 C) (!) 97.3 F (36.3 C)   TempSrc: Oral    SpO2: 97% 94% 95%  Weight: 114.3 kg    Height: 6' (1.829 m)      Intake/Output Summary (Last 24 hours) at 04/22/2018 1715 Last data filed at  04/22/2018 1637 Gross per 24 hour  Intake 1200 ml  Output 50 ml  Net 1150 ml   Filed Weights   04/22/18 1320  Weight: 114.3 kg   Body mass index is 34.18 kg/m.  General:  Obese WM, well nourished, well developed, in no acute distress HEENT: normal Lymph: no adenopathy Neck: no JVD Endocrine:  No thryomegaly Vascular: No carotid bruits; FA pulses 2+ bilaterally without bruits  Cardiac:  irregularly irregular rhythm, controlled rate; no murmur  Lungs:  clear to auscultation bilaterally, no wheezing, rhonchi or rales  Abd: soft, nontender, no hepatomegaly  Ext: no edema Musculoskeletal:  No deformities, BUE and BLE strength normal and equal Skin: warm and dry  Neuro:  CNs 2-12 intact, no focal abnormalities noted Psych:  Normal affect   EKG:  The EKG was personally reviewed and demonstrates:  New onset atrial flutter 77 bpm with incomplete RBBB Telemetry:  Telemetry was personally reviewed and demonstrates:  afib 70s-80s  Relevant CV Studies: NST 12/06/17 Study Highlights     Nuclear stress EF: 54%.  No T wave inversion was noted during stress.  There was no ST segment deviation noted during stress.  This is a low risk study.   Normal perfusion. LVEF 54% with normal wall motion. This is a low risk study.     Laboratory Data:  Chemistry Recent Labs  Lab 04/20/18 0831  NA 140  K 3.5  CL 100  CO2 26  GLUCOSE 95  BUN 20  CREATININE 1.03  CALCIUM 10.1  GFRNONAA >60  GFRAA >60  ANIONGAP 14    No results for input(s): PROT, ALBUMIN, AST, ALT, ALKPHOS, BILITOT in the last 168 hours. Hematology Recent Labs  Lab 04/20/18 0831  WBC 6.6  RBC 5.28  HGB 16.8  HCT 49.5  MCV 93.8  MCH 31.8  MCHC 33.9  RDW 12.7  PLT 207   Cardiac EnzymesNo results for input(s): TROPONINI in the last 168 hours. No results for input(s): TROPIPOC in the last 168 hours.  BNPNo results for input(s): BNP, PROBNP in the last 168 hours.  DDimer No results for input(s): DDIMER in  the last 168 hours.  Radiology/Studies:  No results found.  Assessment and Plan:   Henry Figueroa is a 62 y.o. male with a hx of HTN, OSA and DDD, admitted for lumbar surgery, L5-S1 microdisectomy, who is being seen today for the evaluation of perioperative operative atrial fibrillation, at the request of Dr. Vertell Limber, Neurosurgery.   1. New Onset Atrial Fibrillation/Flutter: discovered perioperatively during lumbar surgery for DDD today. Per RN report he was monitored on tele during surgery and was initially in NSR but went into fib/ flutter w/ CVR during the case. No preop EKG right before surgery. Last EKG on file is from April and showed SR w/ Incomplete RBBB. Pt remains in afib currently with CVR in the 70s and asymptomatic. Denies any recent CP, palpitations or dyspnea. Had NST in May of this year that was low risk and negative for ischemia. He also has OSA but noncompliant w/ CPAP. CBC and BMP WNL. He will be admitted overnight by neurosurgery and monitored on tele. He was on  blocker therapy as an outpatient, on Bystolic 5 mg daily. Reorder. Check 2D echo and TSH. Arrhthymias may be stress related from surgery. Untreated OSA may also be contributing. If no spontaneous conversion, would recommend initiation of oral a/c, once ok to do so from a surgical standpoint and outpatient DCCV after 3 weeks of uninterrupted a/c. CHA2DS2 VASc score is at least 1 for HTN.   For questions or updates, please contact [place] Please consult [email or web address] for contact info under     Signed, Lyda Jester, PA-C  04/22/2018 5:15 PM  Personally seen and examined. Agree with above.  62 year old male with obesity obstructive sleep apnea here for microdiscectomy lumbar, Dr. Vertell Limber who developed perioperative atrial flutter, typical atrial flutter which then transition to atrial fibrillation.  No prior history of atrial fibrillation or atrial flutter he states.  He did have a stress test not too long ago  which was low risk.  Currently he is comfortable in bed.  Atrial fibrillation on telemetry personally reviewed and interpreted heart rate of 80, rate controlled.  He takes Journalist, newspaper at home.  GEN: Well nourished, well developed, in no acute distress, overweight HEENT: normal  Neck: no JVD, carotid bruits, or masses Cardiac: RRR; no murmurs, rubs, or gallops,no edema  Respiratory:  clear to auscultation bilaterally, normal work of breathing GI: soft, nontender, nondistended, + BS MS: no deformity or atrophy  Skin: warm and dry, no rash Neuro:  Alert and Oriented x 3, Strength and sensation are intact Psych: euthymic mood, full affect  Nuclear stress test 12/06/2017- low risk.  EF 54%.  Assessment and plan:  Paroxysmal atrial fibrillation, perioperative - Several risk factors for atrial fibrillation/atrial flutter, obesity, sleep apnea for instance.  Hopefully over the next 12 to 24 hours he will auto convert from his perioperative atrial fibrillation.  Originally his EKG shows atypical flutter pattern with negative deflections in 2 3 F.  He is currently in atrial fibrillation with heart rates ranging from 70-80.  Rate control.  He is not having any chest discomfort fevers chills nausea vomiting syncope bleeding shortness of breath. - If his atrial fibrillation continues for greater than 24 hours, we may need to utilize anticoagulation when felt safe from a surgical perspective with the plan to offer cardioversion after 3 complete weeks of anticoagulation. - We will check an echocardiogram. -I agree with restarting Bystolic 5 mill grams a day, beta-blocker.  Obstructive sleep apnea -Does not wear CPAP.  Encourage.  We will follow.  Candee Furbish, MD

## 2018-04-22 NOTE — Anesthesia Preprocedure Evaluation (Addendum)
Anesthesia Evaluation  Patient identified by MRN, date of birth, ID band Patient awake    Reviewed: Allergy & Precautions, NPO status , Patient's Chart, lab work & pertinent test results  Airway Mallampati: II  TM Distance: >3 FB Neck ROM: Full    Dental no notable dental hx. (+) Teeth Intact, Dental Advisory Given   Pulmonary Current Smoker,    Pulmonary exam normal breath sounds clear to auscultation       Cardiovascular hypertension, Pt. on medications negative cardio ROS Normal cardiovascular exam Rhythm:Regular Rate:Normal  Lexiscan 12/06/2017    Nuclear stress EF: 54%.  No T wave inversion was noted during stress.  There was no ST segment deviation noted during stress.  This is a low risk study.   Normal perfusion. LVEF 54% with normal wall motion. This is a low risk study.    Neuro/Psych  Neuromuscular disease    GI/Hepatic Neg liver ROS, GERD  ,  Endo/Other  negative endocrine ROS  Renal/GU negative Renal ROS     Musculoskeletal   Abdominal (+) + obese,   Peds  Hematology negative hematology ROS (+)   Anesthesia Other Findings   Reproductive/Obstetrics                            Anesthesia Physical Anesthesia Plan  ASA: II  Anesthesia Plan: General   Post-op Pain Management:    Induction: Intravenous  PONV Risk Score and Plan: Treatment may vary due to age or medical condition, Ondansetron and Dexamethasone  Airway Management Planned: Oral ETT  Additional Equipment:   Intra-op Plan:   Post-operative Plan: Extubation in OR  Informed Consent: I have reviewed the patients History and Physical, chart, labs and discussed the procedure including the risks, benefits and alternatives for the proposed anesthesia with the patient or authorized representative who has indicated his/her understanding and acceptance.   Dental advisory given  Plan Discussed with:    Anesthesia Plan Comments:         Anesthesia Quick Evaluation

## 2018-04-22 NOTE — H&P (Signed)
Patient ID:   [id number] Patient: Henry Figueroa  Date of Birth: January 16, 1956 Visit Type: Office Visit   Date: 04/13/2018 02:15 PM Provider: Marchia Meiers. Vertell Limber MD   This 62 year old male presents for back pain.  HISTORY OF PRESENT ILLNESS:  1.  back pain  Patient returns to review his MRI  The patient's MRI demonstrates significant degeneration at the L5-S1 level with fluid in both facet joints worse on the right than the left.  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  This does not appear to be terribly severe however.  There is a large disc herniation on the left at the L5-S1 level which is the likely basis for his severe left leg pain.  This persists and has not improved since I last saw him.  He currently describes his low back pain as 7/10 in severity.  While his facet arthropathy is worst at the L5-S1 level there is evidence of facet degenerative changes throughout his lumbar spine.  I explained to the patient that I think he needs to undergo a left L5-S1 microdiskectomy.  If this does not relieve his pain or his disc real ruptures then he will likely require decompression and fusion at the L5-S1 level but I have recommended against this option as the 1st line treatment and would instead in favor a simple microdiskectomy.  I do not think that nonsurgical treatment is good option for the patient.  He continues to have severe pain.         Medical/Surgical/Interim History Reviewed, no change.     PAST MEDICAL HISTORY, SURGICAL HISTORY, FAMILY HISTORY, SOCIAL HISTORY AND REVIEW OF SYSTEMS I have reviewed the patient's past medical, surgical, family and social history as well as the comprehensive review of systems as included on the [facility] history form dated 04/13/2018, which I have signed.  Family History:  Reviewed, no changes.    Social History: Reviewed, no changes.   MEDICATIONS: (added, continued  or stopped this visit) Started Medication Directions Instruction Stopped   gabapentin  BUCCAL      rosuvastatin  BUCCAL      Tramadol      valsartan 40 mg tablet take 1 tablet by oral route  every day       ALLERGIES: Ingredient Reaction Medication Name Comment  NO KNOWN ALLERGIES     No known allergies.    PHYSICAL EXAM:   Vitals Date Temp F BP Pulse Ht In Wt Lb BMI BSA Pain Score  04/13/2018  145/94 89 72 255 34.58  7/10      IMPRESSION:   Large disc herniation L5-S1 left.  Recommend proceed with left L5-S1 microdiskectomy.  This has been set up for 04/22/2018.  Risks and benefits were discussed with the patient he wishes to proceed.  PLAN:  Left L5-S1 microdiskectomy on an expedited basis.   Assessment/Plan   # Detail Type Description   1. Assessment Low back pain, unspecified back pain laterality, with sciatica presence unspecified (M54.5).       2. Assessment Disc displacement, lumbar (M51.26).       3. Assessment Radiculopathy, lumbar region (M54.16).       4. Assessment Spondylolisthesis, lumbosacral region (M43.17).                     Provider:  Marchia Meiers. Vertell Limber MD  04/16/2018 03:46 PM Dictation edited by: Marchia Meiers. Vertell Limber    CC Providers: Broadus John  Vertell Limber MD  [address] [place], [place] [address]               Electronically signed by Marchia Meiers. Vertell Limber MD on 04/16/2018 03:46 PM

## 2018-04-22 NOTE — Transfer of Care (Signed)
Immediate Anesthesia Transfer of Care Note  Patient: Henry Figueroa  Procedure(s) Performed: Left Lumbar Five-Sacral One Microdiscectomy (Left Spine Lumbar)  Patient Location: PACU  Anesthesia Type:General  Level of Consciousness: awake  Airway & Oxygen Therapy: Patient Spontanous Breathing  Post-op Assessment: Report given to RN and Post -op Vital signs reviewed and stable  Post vital signs: Reviewed and stable  Last Vitals:  Vitals Value Taken Time  BP 124/86 04/22/2018  4:46 PM  Temp    Pulse 85 04/22/2018  4:49 PM  Resp 19 04/22/2018  4:49 PM  SpO2 93 % 04/22/2018  4:49 PM  Vitals shown include unvalidated device data.  Last Pain:  Vitals:   04/22/18 1320  TempSrc: Oral  PainSc: 3       Patients Stated Pain Goal: 3 (09/90/68 9340)  Complications: No apparent anesthesia complications

## 2018-04-22 NOTE — Interval H&P Note (Signed)
History and Physical Interval Note:  04/22/2018 2:40 PM  Henry Figueroa  has presented today for surgery, with the diagnosis of Disc displacement, Lumbar  The various methods of treatment have been discussed with the patient and family. After consideration of risks, benefits and other options for treatment, the patient has consented to  Procedure(s) with comments: Left Lumbar 5 Sacral 1 Microdiscectomy (Left) - Left Lumbar 5 Sacral 1 Microdiscectomy as a surgical intervention .  The patient's history has been reviewed, patient examined, no change in status, stable for surgery.  I have reviewed the patient's chart and labs.  Questions were answered to the patient's satisfaction.     Peggyann Shoals

## 2018-04-22 NOTE — Brief Op Note (Signed)
04/22/2018  5:05 PM  PATIENT:  Henry Figueroa  62 y.o. 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, DDD, radiculopathy, lumbago   PROCEDURE:  Procedure(s) with comments: Left Lumbar Five-Sacral One Microdiscectomy (Left) - Left Lumbar 5 Sacral 1 Microdiscectomy  SURGEON:  Surgeon(s) and Role:    Erline Levine, MD - Primary    * Consuella Lose, MD - Assisting  PHYSICIAN ASSISTANT:   ASSISTANTS: Poteat, RN   ANESTHESIA:   general  EBL:  50 mL   BLOOD ADMINISTERED:none  DRAINS: none   LOCAL MEDICATIONS USED:  MARCAINE    and LIDOCAINE   SPECIMEN:  No Specimen  DISPOSITION OF SPECIMEN:  N/A  COUNTS:  YES  TOURNIQUET:  * No tourniquets in log *  DICTATION: Patient has a large L 5 S 1 disc rupture on the left with significant left leg weakness. It was elected to take him to surgery for left L 5 S 1 microdiscectomy.  Procedure: Patient was brought to the operating room and following the smooth and uncomplicated induction of general endotracheal anesthesia he was placed in a prone position on the Wilson frame. Low back was prepped and draped in the usual sterile fashion with betadine scrub and DuraPrep. Preoperative localizing X ray was obtained with a spinal needle.  Area of planned incision was infiltrated with local lidocaine. Incision was made in the midline and carried to the lumbodorsal fascia which was incised on the left side of midline. Subperiosteal dissection was performed exposing what was felt to be L 5S1 level. Intraoperative x-ray demonstrated marker probe at L5 1 .  A hemi-semi-laminectomy of L 5 was performed a high-speed drill and completed with Kerrison rongeurs and a generous foraminotomy was performed overlying the superior aspect of the L 5 lamina. Ligamentum flavum was detached and removed in a  piecemeal fashion and the L 4 nerve root was decompressed laterally with removal of the superior aspect of the facet and ligamentum causing nerve root compression. The microscope was brought into the field and the S 1nerve root was mobilized medially. This exposed a large amount of soft disc material and a superiorly migrated free fragment of herniated disc material. Multiple fragments were removed and these extended into the interspace which appeared to be quite soft with a disrupted annulus overlying the interspace. As a result it was elected to further decompress the interspace and remove loose disc material and this was done with a variety of pituitary rongeurs. The redundant annulus was also removed with 2 mm Kerrison rongeur.  At this point it was felt that all neural elements were well decompressed and there was no evidence of residual loose disc material within the interspace. The interspace was then irrigated with saline and no additional disc material was mobilized. Hemostasis was assured with bipolar electrocautery and the interspace was irrigated with Depo-Medrol and fentanyl. The lumbodorsal fascia was closed with 0 Vicryl sutures the subcutaneous tissues reapproximated 2-0 Vicryl inverted sutures and the skin edges were reapproximated with 3-0 Vicryl subcuticular stitch. The wound is dressed with Dermabond and an occlusive dressing. Patient was extubated in the operating room and taken to recovery in stable and satisfactory condition having tolerated his operation well counts were correct at the end of the case.   PLAN OF CARE: Admit for overnight observation  PATIENT DISPOSITION:  PACU - hemodynamically stable.  Delay start of Pharmacological VTE agent (>24hrs) due to surgical blood loss or risk of bleeding: yes

## 2018-04-23 ENCOUNTER — Observation Stay (HOSPITAL_BASED_OUTPATIENT_CLINIC_OR_DEPARTMENT_OTHER): Payer: Managed Care, Other (non HMO)

## 2018-04-23 ENCOUNTER — Encounter (HOSPITAL_COMMUNITY): Payer: Self-pay | Admitting: Neurosurgery

## 2018-04-23 DIAGNOSIS — I483 Typical atrial flutter: Secondary | ICD-10-CM

## 2018-04-23 DIAGNOSIS — M5117 Intervertebral disc disorders with radiculopathy, lumbosacral region: Secondary | ICD-10-CM | POA: Diagnosis not present

## 2018-04-23 DIAGNOSIS — I351 Nonrheumatic aortic (valve) insufficiency: Secondary | ICD-10-CM | POA: Diagnosis not present

## 2018-04-23 DIAGNOSIS — G4733 Obstructive sleep apnea (adult) (pediatric): Secondary | ICD-10-CM | POA: Diagnosis not present

## 2018-04-23 LAB — ECHOCARDIOGRAM COMPLETE
HEIGHTINCHES: 72 in
Weight: 4123.48 oz

## 2018-04-23 LAB — MRSA PCR SCREENING: MRSA by PCR: NEGATIVE

## 2018-04-23 MED ORDER — FAMOTIDINE 20 MG PO TABS
20.0000 mg | ORAL_TABLET | Freq: Two times a day (BID) | ORAL | Status: DC
  Administered 2018-04-23: 20 mg via ORAL
  Filled 2018-04-23: qty 1

## 2018-04-23 MED ORDER — HYDROCODONE-ACETAMINOPHEN 5-325 MG PO TABS: 1.0000 | ORAL_TABLET | ORAL | 0 refills | Status: DC | PRN

## 2018-04-23 MED ORDER — DOCUSATE SODIUM 100 MG PO CAPS: 100.0000 mg | ORAL_CAPSULE | Freq: Two times a day (BID) | ORAL | 0 refills | Status: DC

## 2018-04-23 MED ORDER — METHOCARBAMOL 500 MG PO TABS: 500.0000 mg | ORAL_TABLET | Freq: Four times a day (QID) | ORAL | 0 refills | Status: DC | PRN

## 2018-04-23 NOTE — Evaluation (Signed)
Physical Therapy Evaluation Patient Details Name: Henry Figueroa MRN: [id number] DOB: 1956-03-03 Today's Date: 04/23/2018   History of Present Illness  pt is a 62 y/o male with h/o HTN, admitted for elective lumbar decompressio surgery due to DDD and radicular pain.  s/p L5-S1 microdiscetomy.  Clinical Impression  Pt is close to baseline functioning and should be safe at home with family assist. There are no further acute PT needs.  Will sign off at this time.     Follow Up Recommendations No PT follow up;Supervision - Intermittent    Equipment Recommendations  None recommended by PT    Recommendations for Other Services       Precautions / Restrictions Precautions Precautions: Back Precaution Booklet Issued: Yes (comment) Restrictions Weight Bearing Restrictions: No      Mobility  Bed Mobility               General bed mobility comments: cues for technique, no assist needed  Transfers Overall transfer level: Needs assistance   Transfers: Sit to/from Stand Sit to Stand: Supervision         General transfer comment: cues for hand placement only  Ambulation/Gait Ambulation/Gait assistance: Supervision Gait Distance (Feet): 250 Feet Assistive device: None Gait Pattern/deviations: Step-through pattern Gait velocity: moderate Gait velocity interpretation: 1.31 - 2.62 ft/sec, indicative of limited community ambulator General Gait Details: steady, but mildly antalgic.  Improved with time up.  Stairs Stairs: Yes Stairs assistance: Supervision Stair Management: One rail Right;Alternating pattern;Step to pattern;Forwards Number of Stairs: 5 General stair comments: safe with the rail  Wheelchair Mobility    Modified Rankin (Stroke Patients Only)       Balance Overall balance assessment: No apparent balance deficits (not formally assessed)                                           Pertinent Vitals/Pain Pain Assessment: Faces Faces  Pain Scale: Hurts little more Pain Location: back, mild radicular Pain Descriptors / Indicators: Guarding;Grimacing Pain Intervention(s): Monitored during session    Home Living Family/patient expects to be discharged to:: Private residence Living Arrangements: Alone Available Help at Discharge: Family;Available PRN/intermittently Type of Home: House Home Access: Stairs to enter   [employer] of Steps: 1 Home Layout: Two level;Bed/bath upstairs Home Equipment: (has access to reacher and equipment from MOM)      Prior Function Level of Independence: Independent         Comments: independent, drove, worked in Lobby[employer]   Dominant Hand: Right    Extremity/Trunk Assessment   Upper Extremity Assessment Upper Extremity Assessment: Overall WFL for tasks assessed    Lower Extremity Assessment Lower Extremity Assessment: Overall WFL for tasks assessed       Communication   Communication: No difficulties  Cognition Arousal/Alertness: Awake/alert Behavior During Therapy: WFL for tasks assessed/performed Overall Cognitive Status: Within Functional Limits for tasks assessed                                        General Comments General comments (skin integrity, edema, etc.): pt advised on back care/prec, log roll/transitions to/from sitting, lifting restrictions and progressio of activity.    Exercises     Assessment/Plan    PT Assessment Patent does not need any further  PT services  PT Problem List         PT Treatment Interventions      PT Goals (Current goals can be found in the Care Plan section)  Acute Rehab PT Goals Patient Stated Goal: home PT Goal Formulation: All assessment and education complete, DC therapy    Frequency     Barriers to discharge        Co-evaluation               AM-PAC PT "6 Clicks" Daily Activity  Outcome Measure Difficulty turning over in bed (including adjusting  bedclothes, sheets and blankets)?: A Little Difficulty moving from lying on back to sitting on the side of the bed? : A Little Difficulty sitting down on and standing up from a chair with arms (e.g., wheelchair, bedside commode, etc,.)?: A Little Help needed moving to and from a bed to chair (including a wheelchair)?: A Little Help needed walking in hospital room?: A Little Help needed climbing 3-5 steps with a railing? : A Little 6 Click Score: 18    End of Session   Activity Tolerance: Patient tolerated treatment well Patient left: in chair;with call bell/phone within reach Nurse Communication: Mobility status PT Visit Diagnosis: Other abnormalities of gait and mobility (R26.89);Pain Pain - part of body: (back)    Time: 7026-3785 PT Time Calculation (min) (ACUTE ONLY): 28 min   Charges:   PT Evaluation $PT Eval Low Complexity: 1 Low PT Treatments $Gait Training: 8-22 mins        04/23/2018  Donnella Sham, PT Acute Rehabilitation Services [phone]  (pager) [phone]  (office)  Tessie Fass Raaga Maeder 04/23/2018, 10:45 AM

## 2018-04-23 NOTE — Discharge Summary (Signed)
Physician Discharge Summary  Patient ID: Henry Figueroa MRN: [id number] DOB/AGE: 63/04/1956 63 y.o.  Admit date: 04/22/2018 Discharge date: 04/23/2018  Admission Diagnoses:Lumbosacral herniated disc, lumbosacral radiculopathy, lumbago  Discharge Diagnoses:  The same and atrial fibrillation Active Problems:   Herniated lumbar disc without myelopathy   Discharged Condition: good  Hospital Course:  Dr. Vertell Limber performed a L5-S1 diskectomy on the patient on 04/22/2018.  The patient's postoperative course was remarkable for atrial fibrillation.  He was seen by Cardiology.  The atrial fibrillation resolved spontaneously.  An echocardiogram was performed which was fairly unremarkable.  The patient was cleared for discharge to home by from cardiology.  He requested discharge to home.  He was given written discharge instructions  Consults: cardiology Significant Diagnostic Studies: echocardiogram Treatments: L5 S1 diskectomy Discharge Exam: Blood pressure (!) 144/100, pulse 88, temperature 98.3 F (36.8 C), temperature source Oral, resp. rate 18, height 6' (1.829 m), weight 116.9 kg, SpO2 98 %.  the patient is alert and pleasant.  His strength is normal.  He looks well.  Disposition:   Home with follow-up with Dr. Vertell Limber and Cardiology  Discharge Instructions    Call MD for:  difficulty breathing, headache or visual disturbances   Complete by:  As directed    Call MD for:  extreme fatigue   Complete by:  As directed    Call MD for:  hives   Complete by:  As directed    Call MD for:  persistant dizziness or light-headedness   Complete by:  As directed    Call MD for:  persistant nausea and vomiting   Complete by:  As directed    Call MD for:  redness, tenderness, or signs of infection (pain, swelling, redness, odor or green/yellow discharge around incision site)   Complete by:  As directed    Call MD for:  severe uncontrolled pain   Complete by:  As directed    Call MD for:  temperature  >100.4   Complete by:  As directed    Diet - low sodium heart healthy   Complete by:  As directed    Discharge instructions   Complete by:  As directed    Call [phone] for a followup appointment. Take a stool softener while you are using pain medications.  Follow-up with cardiology as instructed.   Driving Restrictions   Complete by:  As directed    Do not drive for 2 weeks.   Increase activity slowly   Complete by:  As directed    Lifting restrictions   Complete by:  As directed    Do not lift more than 5 pounds. No excessive bending or twisting.   May shower / Bathe   Complete by:  As directed    Remove the dressing for 3 days after surgery.  You may shower, but leave the incision alone.   Remove dressing in 48 hours   Complete by:  As directed    Your stitches are under the scan and will dissolve by themselves. The Steri-Strips will fall off after you take a few showers. Do not rub back or pick at the wound, Leave the wound alone.     Allergies as of 04/23/2018   No Known Allergies     Medication List    STOP taking these medications   acetaminophen 650 MG CR tablet Commonly known as:  TYLENOL   traMADol 50 MG tablet Commonly known as:  ULTRAM     TAKE these medications  Azilsartan-Chlorthalidone 40-25 MG Tabs Take 1 tablet by mouth daily.   Cholecalciferol 50000 units capsule Take 1 capsule (50,000 Units total) by mouth once a week. What changed:  when to take this   docusate sodium 100 MG capsule Commonly known as:  COLACE Take 1 capsule (100 mg total) by mouth 2 (two) times daily.   gabapentin 100 MG capsule Commonly known as:  NEURONTIN Take 2 capsules (200 mg total) by mouth at bedtime. What changed:  when to take this   HYDROcodone-acetaminophen 5-325 MG tablet Commonly known as:  NORCO/VICODIN Take 1-2 tablets by mouth every 4 (four) hours as needed for moderate pain ((score 4 to 6)).   levocetirizine 5 MG tablet Commonly known as:   XYZAL Take 1 tablet (5 mg total) by mouth every evening.   methocarbamol 500 MG tablet Commonly known as:  ROBAXIN Take 1 tablet (500 mg total) by mouth every 6 (six) hours as needed for muscle spasms.   nebivolol 5 MG tablet Commonly known as:  BYSTOLIC Take 1 tablet (5 mg total) by mouth daily.   rosuvastatin 10 MG tablet Commonly known as:  CRESTOR Take 1 tablet (10 mg total) by mouth daily.      Follow-up Information    Jerline Pain, MD Follow up.   Specialty:  Cardiology Why:  Our office will call you on Monday to schedule a cardiology hospital follow-up for 2-3 weeks. Contact information: [address] [place] [address] [phone]        [facility] Follow up.   Specialty:  Cardiology Why:  The atrial fibrillation clinic will call you to schedule an appointment for 1 week.  Please answer your phone for numbers beginning with [phone] Contact information: [address] [id number] [place] [phone]          Signed: Ophelia Charter 04/23/2018, 1:41 PM

## 2018-04-23 NOTE — Progress Notes (Signed)
Subjective: The patient is alert and pleasant.  He still has some leg pain.  His back is appropriately sore.  He looks like he is back in sinus rhythm.  Objective: Vital signs in last 24 hours: Temp:  [97.3 F (36.3 C)-98.8 F (37.1 C)] 97.7 F (36.5 C) (09/28 0300) Pulse Rate:  [72-88] 81 (09/28 0300) Resp:  [15-20] 20 (09/27 1930) BP: (110-150)/(62-101) 124/83 (09/28 0300) SpO2:  [93 %-98 %] 95 % (09/28 0300) Weight:  [114.3 kg-116.9 kg] 116.9 kg (09/28 0400) Estimated body mass index is 34.95 kg/m as calculated from the following:   Height as of this encounter: 6' (1.829 m).   Weight as of this encounter: 116.9 kg.   Intake/Output from previous day: 09/27 0701 - 09/28 0700 In: 1973.7 [P.O.:200; I.V.:1773.7] Out: 250 [Urine:200; Blood:50] Intake/Output this shift: No intake/output data recorded.  Physical exam patient is alert and oriented.  He looks well.  He is moving his lower extremities well.  Lab Results: Recent Labs    04/20/18 0831  WBC 6.6  HGB 16.8  HCT 49.5  PLT 207   BMET Recent Labs    04/20/18 0831  NA 140  K 3.5  CL 100  CO2 26  GLUCOSE 95  BUN 20  CREATININE 1.03  CALCIUM 10.1    Studies/Results: Dg Lumbar Spine 2-3 Views  Result Date: 04/22/2018 CLINICAL DATA:  L5-S1 microdiscectomy. EXAM: LUMBAR SPINE - 2-3 VIEW COMPARISON:  None. FINDINGS: Single intraoperative cross-table lateral x-ray of the lumbar spine is provided. Image 1 demonstrates a posterior metallic needle with the tip projecting just posterior to the L5 spinous process. Image 2 demonstrates posterior tissue retractors. Blunt tip metallic probe projecting over the L5-S1 facet joint. IMPRESSION: Intraoperative localization as described above. Electronically Signed   By: Kathreen Devoid   On: 04/22/2018 18:47    Assessment/Plan: A. fib: This looks like it has converted to a normal sinus rhythm.  We will await cardiology's input as far as timing of discharge and any further work-up  needed.  Status post ectomy: He is doing well from that point of view.  LOS: 0 days     Ophelia Charter 04/23/2018, 7:59 AM

## 2018-04-23 NOTE — Consult Note (Addendum)
ELECTROPHYSIOLOGY CONSULT NOTE    Patient ID: Henry Figueroa MRN: [id number], DOB/AGE: 11/17/1955 62 y.o.  Admit date: 04/22/2018 Date of Consult: 04/23/2018  Primary Physician: Janith Lima, MD Primary Cardiologist: Dr Marlou Porch Electrophysiologist: Dr Rayann Heman  Patient Profile: Henry Figueroa is a 62 y.o. male with  No prior cardiac history who is being seen today for the evaluation of atrial flutter at the request of Dr Marlou Porch.  HPI:  Henry Figueroa is a 62 y.o. male is admitted for back surgery.  He has been found to have typical atrial flutter.  This is new.  He has no prior atrial arrhythmia history.  He is asymptomatic and currently rate controlled.  He is not currently a candidate for anticoagulation.    He denies chest pain, palpitations, dyspnea, PND, orthopnea, nausea, vomiting, dizziness, syncope, edema, weight gain, or early satiety.  Past Medical History:  Diagnosis Date  . Basal cell carcinoma    Back  . Hypertension   . Sleep apnea    has appointment in October 2019 to get CPAP     Surgical History:  Past Surgical History:  Procedure Laterality Date  . KNEE SURGERY Right   . LUMBAR LAMINECTOMY/DECOMPRESSION MICRODISCECTOMY Left 04/22/2018   Procedure: Left Lumbar Five-Sacral One Microdiscectomy;  Surgeon: Erline Levine, MD;  Location: [facility];  Service: Neurosurgery;  Laterality: Left;  Left Lumbar 5 Sacral 1 Microdiscectomy  . SHOULDER ARTHROSCOPY Left      Medications Prior to Admission  Medication Sig Dispense Refill Last Dose  . acetaminophen (TYLENOL) 650 MG CR tablet Take 650 mg by mouth 2 (two) times daily.   04/21/2018 at Unknown time  . Azilsartan-Chlorthalidone (EDARBYCLOR) 40-25 MG TABS Take 1 tablet by mouth daily. 70 tablet 0 04/22/2018 at 0900  . Cholecalciferol 50000 units capsule Take 1 capsule (50,000 Units total) by mouth once a week. (Patient taking differently: Take 50,000 Units by mouth every Friday. ) 12 capsule 1 Past Week at Unknown time  .  gabapentin (NEURONTIN) 100 MG capsule Take 2 capsules (200 mg total) by mouth at bedtime. (Patient taking differently: Take 200 mg by mouth every morning. ) 90 capsule 0 04/22/2018 at Unknown time  . levocetirizine (XYZAL) 5 MG tablet Take 1 tablet (5 mg total) by mouth every evening. 90 tablet 1 04/21/2018 at Unknown time  . nebivolol (BYSTOLIC) 5 MG tablet Take 1 tablet (5 mg total) by mouth daily. 84 tablet 0 04/22/2018 at 0900  . rosuvastatin (CRESTOR) 10 MG tablet Take 1 tablet (10 mg total) by mouth daily. 90 tablet 1 04/21/2018 at Unknown time  . traMADol (ULTRAM) 50 MG tablet Take 1 tablet (50 mg total) by mouth every 12 (twelve) hours as needed. 180 tablet 0 Past Week at Unknown time    Inpatient Medications:  . acetaminophen  1,000 mg Oral Q6H  . acetaminophen  325 mg Oral TID WC & HS  . celecoxib  200 mg Oral Q12H  . chlorthalidone  25 mg Oral Daily  . docusate sodium  100 mg Oral BID  . famotidine  20 mg Oral BID  . gabapentin  200 mg Oral q morning - 10a  . irbesartan  300 mg Oral Daily  . ketorolac  15 mg Intravenous Q6H  . loratadine  10 mg Oral Daily  . nebivolol  5 mg Oral Daily  . rosuvastatin  10 mg Oral Daily  . sodium chloride flush  3 mL Intravenous Q12H  . Vitamin D3  1,000 Units Oral Q Fri  Allergies: No Known Allergies  Social History   Socioeconomic History  . Marital status: Single    Spouse name: Not on file  . Number of children: Not on file  . Years of education: Not on file  . Highest education level: Not on file  Occupational History  . Not on file  Social Needs  . Financial resource strain: Not on file  . Food insecurity:    Worry: Not on file    Inability: Not on file  . Transportation needs:    Medical: Not on file    Non-medical: Not on file  Tobacco Use  . Smoking status: Light Tobacco Smoker    Types: Cigarettes    Start date: 10/29/2008  . Smokeless tobacco: Never Used  Substance and Sexual Activity  . Alcohol use: Yes     Alcohol/week: 3.0 standard drinks    Types: 3 Shots of liquor per week  . Drug use: No  . Sexual activity: Not Currently  Lifestyle  . Physical activity:    Days per week: Not on file    Minutes per session: Not on file  . Stress: Not on file  Relationships  . Social connections:    Talks on phone: Not on file    Gets together: Not on file    Attends religious service: Not on file    Active member of club or organization: Not on file    Attends meetings of clubs or organizations: Not on file    Relationship status: Not on file  . Intimate partner violence:    Fear of current or ex partner: Not on file    Emotionally abused: Not on file    Physically abused: Not on file    Forced sexual activity: Not on file  Other Topics Concern  . Not on file  Social History Narrative   Lives alone in a 2 story home.  Has no children.     Works as a Hotel manager.     Education: college degree.     Family History  Problem Relation Age of Onset  . Alcohol abuse Father   . Dementia Father   . Cancer Neg Hx   . Depression Neg Hx   . Diabetes Neg Hx   . Drug abuse Neg Hx   . Early death Neg Hx   . Heart disease Neg Hx   . Hyperlipidemia Neg Hx   . Hypertension Neg Hx   . Kidney disease Neg Hx   . Stroke Neg Hx      Review of Systems: All other systems reviewed and are otherwise negative except as noted above.  Physical Exam: Vitals:   04/22/18 2300 04/23/18 0300 04/23/18 0400 04/23/18 0900  BP: 110/62 124/83  (!) 156/107  Pulse: 82 81  82  Resp:    18  Temp: 98.4 F (36.9 C) 97.7 F (36.5 C)  97.8 F (36.6 C)  TempSrc: Oral Oral  Oral  SpO2: 98% 95%  99%  Weight:   116.9 kg   Height:        GEN- The patient is well appearing, alert and oriented x 3 today.   HEENT: normocephalic, atraumatic; sclera clear, conjunctiva pink; hearing intact; oropharynx clear; neck supple Lungs- Clear to ausculation bilaterally, normal work of breathing.  No wheezes, rales, rhonchi Heart-  irregular rate and rhythm, no murmurs, rubs or gallops GI- soft, non-tender, non-distended, bowel sounds present Extremities- no clubbing, cyanosis, or edema; DP/PT/radial pulses 2+ bilaterally MS- no significant  deformity or atrophy Skin- warm and dry, no rash or lesion Psych- euthymic mood, full affect Neuro- strength and sensation are intact  Labs:   Lab Results  Component Value Date   WBC 6.6 04/20/2018   HGB 16.8 04/20/2018   HCT 49.5 04/20/2018   MCV 93.8 04/20/2018   PLT 207 04/20/2018    Recent Labs  Lab 04/20/18 0831  NA 140  K 3.5  CL 100  CO2 26  BUN 20  CREATININE 1.03  CALCIUM 10.1  GLUCOSE 95      Radiology/Studies: Dg Lumbar Spine 2-3 Views  Result Date: 04/22/2018 CLINICAL DATA:  L5-S1 microdiscectomy. EXAM: LUMBAR SPINE - 2-3 VIEW COMPARISON:  None. FINDINGS: Single intraoperative cross-table lateral x-ray of the lumbar spine is provided. Image 1 demonstrates a posterior metallic needle with the tip projecting just posterior to the L5 spinous process. Image 2 demonstrates posterior tissue retractors. Blunt tip metallic probe projecting over the L5-S1 facet joint. IMPRESSION: Intraoperative localization as described above. Electronically Signed   By: Kathreen Devoid   On: 04/22/2018 18:47    XTK:WIOXBDZ atrial flutter (personally reviewed)  TELEMETRY: rate controlled atrial flutter (personally reviewed)   Assessment/Plan: 1.  Typical atrial flutter Rate controlled Asymptomatic chads2vasc score is 1 I would not advise anticoagulation at this time. If echo is ok, discharge to home. Follow-up in AF clinic in 1 week. At that time, would start anticoagulation if cleared by neurosurgery. I would advise consideration of atrial flutter ablation once he has completely healed from his surgery.  2 OSA Compliance with therapy is advised  Anticipate discharge today if echo is ok I will arrange follow-up in AF clinic   For questions or updates, please contact  [place] Please consult [email or web address] for contact info under Cardiology/STEMI.  Army Fossa MD  04/23/2018 11:42 AM   Addendum:  Echo is reviewed and reveals normal EF, mild LVH, mild aortic root enlargement, ok to discharge with outpatient follow-up Once clnically better, further imaging of the aorta can be performed as an outpatient.  Thompson Grayer MD, [facility] 04/23/2018 12:57 PM

## 2018-04-23 NOTE — Progress Notes (Signed)
All education completed with pt verbalizing and/or demonstrating understanding. No further OT needs.  04/23/18 1100  OT Visit Information  Last OT Received On 04/23/18  Assistance Needed +1  History of Present Illness pt is a 62 y/o male with h/o HTN, admitted for elective lumbar decompressio surgery due to DDD and radicular pain.  s/p L5-S1 microdiscetomy.  Precautions  Precautions Back  Precaution Booklet Issued Yes (comment)  Restrictions  Weight Bearing Restrictions No  Home Living  Family/patient expects to be discharged to: Private residence  Living Arrangements Alone  Available Help at Discharge Family;Available PRN/intermittently  Type of Home House  Home Access Stairs to enter  Entrance Stairs-Number of Steps 1  Home Layout Two level;Bed/bath upstairs  Alternate Level Stairs-Number of Steps flight  Alternate Level Stairs-Rails Right;Left  Bathroom Shower/Tub Walk-in shower;Tub only  Automotive engineer None  Additional Comments has access to a reacher  Prior Function  Level of Independence Independent  Comments independent, drove, worked in Research scientist (physical sciences) No difficulties  Pain Assessment  Pain Assessment Faces  Faces Pain Scale 4  Pain Location back, mild radicular  Pain Descriptors / Indicators Guarding;Grimacing  Pain Intervention(s) Monitored during session  Cognition  Arousal/Alertness Awake/alert  Behavior During Therapy WFL for tasks assessed/performed  Overall Cognitive Status Within Functional Limits for tasks assessed  Upper Extremity Assessment  Upper Extremity Assessment Overall WFL for tasks assessed  Lower Extremity Assessment  Lower Extremity Assessment Defer to PT evaluation  ADL  Overall ADL's  Modified independent  General ADL Comments Educated pt and family in back precautions related to ADL and IADL. Pt verbalizing understanding.   Vision- History  Baseline Vision/History Wears glasses  Wears  Glasses At all times  Patient Visual Report No change from baseline  Bed Mobility  General bed mobility comments pt in chair  Transfers  Overall transfer level Modified independent  General transfer comment increased time, no assist  Balance  Overall balance assessment No apparent balance deficits (not formally assessed)  OT - End of Session  Activity Tolerance Patient tolerated treatment well  Patient left in chair;with call bell/phone within reach;with family/visitor present  OT Assessment  OT Recommendation/Assessment Patient does not need any further OT services  OT Visit Diagnosis Pain  AM-PAC OT "6 Clicks" Daily Activity Outcome Measure  Help from another person eating meals? 4  Help from another person taking care of personal grooming? 4  Help from another person toileting, which includes using toliet, bedpan, or urinal? 4  Help from another person bathing (including washing, rinsing, drying)? 4  Help from another person to put on and taking off regular upper body clothing? 4  Help from another person to put on and taking off regular lower body clothing? 4  6 Click Score 24  ADL G Code Conversion CH  OT Recommendation  Follow Up Recommendations No OT follow up  OT Equipment None recommended by OT  Acute Rehab OT Goals  Patient Stated Goal home  OT Time Calculation  OT Start Time (ACUTE ONLY) 1135  OT Stop Time (ACUTE ONLY) 1149  OT Time Calculation (min) 14 min  OT General Charges  $OT Visit 1 Visit  OT Evaluation  $OT Eval Low Complexity 1 Low  Written Expression  Dominant Hand Right  Nestor Lewandowsky, OTR/L Acute Rehabilitation Services Pager: [phone] Office: [phone]

## 2018-04-23 NOTE — Progress Notes (Signed)
  Echocardiogram 2D Echocardiogram has been performed.  Darlina Sicilian M 04/23/2018, 9:06 AM

## 2018-04-25 ENCOUNTER — Other Ambulatory Visit: Payer: Self-pay | Admitting: Internal Medicine

## 2018-04-25 DIAGNOSIS — E785 Hyperlipidemia, unspecified: Secondary | ICD-10-CM

## 2018-04-25 DIAGNOSIS — J301 Allergic rhinitis due to pollen: Secondary | ICD-10-CM

## 2018-04-28 ENCOUNTER — Encounter (HOSPITAL_COMMUNITY): Payer: Self-pay | Admitting: Neurosurgery

## 2018-04-28 NOTE — Addendum Note (Signed)
Addendum  created 04/28/18 1634 by Roderic Palau, MD   Intraprocedure Event deleted, Intraprocedure Event edited, Intraprocedure Staff edited

## 2018-05-01 ENCOUNTER — Other Ambulatory Visit: Payer: Self-pay | Admitting: Internal Medicine

## 2018-05-03 ENCOUNTER — Ambulatory Visit (INDEPENDENT_AMBULATORY_CARE_PROVIDER_SITE_OTHER): Payer: Managed Care, Other (non HMO) | Admitting: Neurology

## 2018-05-03 ENCOUNTER — Encounter: Payer: Self-pay | Admitting: Neurology

## 2018-05-03 VITALS — BP 145/99 | HR 87 | Ht 72.0 in | Wt 249.0 lb

## 2018-05-03 DIAGNOSIS — I4891 Unspecified atrial fibrillation: Secondary | ICD-10-CM | POA: Diagnosis not present

## 2018-05-03 DIAGNOSIS — I480 Paroxysmal atrial fibrillation: Secondary | ICD-10-CM

## 2018-05-03 DIAGNOSIS — G4733 Obstructive sleep apnea (adult) (pediatric): Secondary | ICD-10-CM | POA: Insufficient documentation

## 2018-05-03 DIAGNOSIS — R0683 Snoring: Secondary | ICD-10-CM | POA: Diagnosis not present

## 2018-05-03 DIAGNOSIS — G4719 Other hypersomnia: Secondary | ICD-10-CM

## 2018-05-03 DIAGNOSIS — G4734 Idiopathic sleep related nonobstructive alveolar hypoventilation: Secondary | ICD-10-CM

## 2018-05-03 NOTE — Patient Instructions (Signed)
Atrial Fibrillation Atrial fibrillation is a type of irregular or rapid heartbeat (arrhythmia). In atrial fibrillation, the heart quivers continuously in a chaotic pattern. This occurs when parts of the heart receive disorganized signals that make the heart unable to pump blood normally. This can increase the risk for stroke, heart failure, and other heart-related conditions. There are different types of atrial fibrillation, including:  Paroxysmal atrial fibrillation. This type starts suddenly, and it usually stops on its own shortly after it starts.  Persistent atrial fibrillation. This type often lasts longer than a week. It may stop on its own or with treatment.  Long-lasting persistent atrial fibrillation. This type lasts longer than 12 months.  Permanent atrial fibrillation. This type does not go away.  Talk with your health care provider to learn about the type of atrial fibrillation that you have. What are the causes? This condition is caused by some heart-related conditions or procedures, including:  A heart attack.  Coronary artery disease.  Heart failure.  Heart valve conditions.  High blood pressure.  Inflammation of the sac that surrounds the heart (pericarditis).  Heart surgery.  Certain heart rhythm disorders, such as Wolf-Parkinson-White syndrome.  Other causes include:  Pneumonia.  Obstructive sleep apnea.  Blockage of an artery in the lungs (pulmonary embolism, or PE).  Lung cancer.  Chronic lung disease.  Thyroid problems, especially if the thyroid is overactive (hyperthyroidism).  Caffeine.  Excessive alcohol use or illegal drug use.  Use of some medicines, including certain decongestants and diet pills.  Sometimes, the cause cannot be found. What increases the risk? This condition is more likely to develop in:  People who are older in age.  People who smoke.  People who have diabetes mellitus.  People who are overweight  (obese).  Athletes who exercise vigorously.  What are the signs or symptoms? Symptoms of this condition include:  A feeling that your heart is beating rapidly or irregularly.  A feeling of discomfort or pain in your chest.  Shortness of breath.  Sudden light-headedness or weakness.  Getting tired easily during exercise.  In some cases, there are no symptoms. How is this diagnosed? Your health care provider may be able to detect atrial fibrillation when taking your pulse. If detected, this condition may be diagnosed with:  An electrocardiogram (ECG).  A Holter monitor test that records your heartbeat patterns over a 24-hour period.  Transthoracic echocardiogram (TTE) to evaluate how blood flows through your heart.  Transesophageal echocardiogram (TEE) to view more detailed images of your heart.  A stress test.  Imaging tests, such as a CT scan or chest X-ray.  Blood tests.  How is this treated? The main goals of treatment are to prevent blood clots from forming and to keep your heart beating at a normal rate and rhythm. The type of treatment that you receive depends on many factors, such as your underlying medical conditions and how you feel when you are experiencing atrial fibrillation. This condition may be treated with:  Medicine to slow down the heart rate, bring the heart's rhythm back to normal, or prevent clots from forming.  Electrical cardioversion. This is a procedure that resets your heart's rhythm by delivering a controlled, low-energy shock to the heart through your skin.  Different types of ablation, such as catheter ablation, catheter ablation with pacemaker, or surgical ablation. These procedures destroy the heart tissues that send abnormal signals. When the pacemaker is used, it is placed under your skin to help your heart beat in   a regular rhythm.  Follow these instructions at home:  Take over-the counter and prescription medicines only as told by your  health care provider.  If your health care provider prescribed a blood-thinning medicine (anticoagulant), take it exactly as told. Taking too much blood-thinning medicine can cause bleeding. If you do not take enough blood-thinning medicine, you will not have the protection that you need against stroke and other problems.  Do not use tobacco products, including cigarettes, chewing tobacco, and e-cigarettes. If you need help quitting, ask your health care provider.  If you have obstructive sleep apnea, manage your condition as told by your health care provider.  Do not drink alcohol.  Do not drink beverages that contain caffeine, such as coffee, soda, and tea.  Maintain a healthy weight. Do not use diet pills unless your health care provider approves. Diet pills may make heart problems worse.  Follow diet instructions as told by your health care provider.  Exercise regularly as told by your health care provider.  Keep all follow-up visits as told by your health care provider. This is important. How is this prevented?  Avoid drinking beverages that contain caffeine or alcohol.  Avoid certain medicines, especially medicines that are used for breathing problems.  Avoid certain herbs and herbal medicines, such as those that contain ephedra or ginseng.  Do not use illegal drugs, such as cocaine and amphetamines.  Do not smoke.  Manage your high blood pressure. Contact a health care provider if:  You notice a change in the rate, rhythm, or strength of your heartbeat.  You are taking an anticoagulant and you notice increased bruising.  You tire more easily when you exercise or exert yourself. Get help right away if:  You have chest pain, abdominal pain, sweating, or weakness.  You feel nauseous.  You notice blood in your vomit, bowel movement, or urine.  You have shortness of breath.  You suddenly have swollen feet and ankles.  You feel dizzy.  You have sudden weakness or  numbness of the face, arm, or leg, especially on one side of the body.  You have trouble speaking, trouble understanding, or both (aphasia).  Your face or your eyelid droops on one side. These symptoms may represent a serious problem that is an emergency. Do not wait to see if the symptoms will go away. Get medical help right away. Call your local emergency services (911 in the [place] Do not drive yourself to the hospital. This information is not intended to replace advice given to you by your health care provider. Make sure you discuss any questions you have with your health care provider. Document Released: 07/13/2005 Document Revised: 11/20/2015 Document Reviewed: 11/07/2014 [employer]vier Interactive Patient Education  2018 [employer]  

## 2018-05-03 NOTE — Progress Notes (Signed)
SLEEP MEDICINE CLINIC   Provider:  Larey Seat, M.D.   Primary Care Physician:  Janith Lima, MD   Referring Provider: Janith Lima, MD   Chief Complaint  Patient presents with  . Follow-up    pt alone. pt here to follow up from sleep study. pt has not started CPAP therapy yet.     HPI:  Henry Figueroa is a 62 y.o. male patient who  seen here on 05-03-2018 in a revisit after PSG .  Originally referred by  Dr. Ronnald Ramp for a sleep evaluation, based on a report of poorly controlled HTN and snoring.   I have the pleasure of seeing [name] today a 62 year old Caucasian gentleman who underwent a home sleep test on apnea link device on 03 February 2018.  His home sleep test indicated a prolonged time of oxygen desaturation a total of 75 minutes within the study that had only a valid recording time of about 1-1/2 hours.  The average oxygen saturation was already below normal at 88% the nadir was 80%.  This was associated with an AHI of 17.9/h and RDI of 19/h indicating that snoring is present.  He did have within normal limits a low heart rate of 57 bpm and a maximum heart rate of 101 bpm.  Also I wanted to invite the patient for a split-night polysomnography and possible oxygen titration, this was denied by his insurance.  Alternatively be ordered in autotitrator but the patient has not received the machine, nor our letters.  In the meantime there have been medical developments he underwent a back surgery and had actually quite severe back pain in the weeks leading up to the surgery.  Leg pain which affected the left calve.  This did not respond to steroid treatments but did respond to gabapentin.  During the back surgery he developed atrial fibrillation, he has an appointment tomorrow with the atrial fibrillation clinic. Due to this development I will urgently asked him to use the autotitrator, the order has been sent to [facility].  He is to be fitted for a mask that is comfortable  for him, he has slight red prognathia and he may benefit from a nasal pillow or nasal cradle mask.   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  He has suffered from hypertension and uses hydrochlorothiazide as a diuretic as well as ARB use.  He has been told that he snores and by now he thinks he may have sleep apnea.  He also recalls that his remote sleep test revealed not enough apnea to justify intervention at the time.  In addition to Diovan hydrochlorothiazide the patient also takes Mobic for arthritis, his BMI is currently around 34.  And blood pressure readings have been rising through the last 3 office visits with his primary. He was placed on another BP medication.    Sleep habits are as follows: 10.30- 11 PM is usually his bed time, but infrequently he finds himself in a comfortable chair asleep while the the TV is watching him in his den. He does not use a TV in the bedroom.  The patient reports that his bedroom is cool, quiet and dark and he prefers to sleep prone but sometimes looking for a cool spot on the pillow he may sleep on his sides.  He is pretty positive that he does not sleep  supine.  He sleeps on one pillow for head and chest support on a flat mattress. His girlfriend noted him to snore, and be daytime sleepy when not active.  He doesn't struggle to go to sleep but to stay asleep, has 2 nocturias (not sure at which time ). He has periods of wakefulness of 20-30 minutes after each.  He sleeps about 5 hours total. He rises at 5.30 , spontaneously. He falls asleep when he rests, sits quietly, and snores, wakes himself snoring.   Sleep medical history and family sleep history:  Father was a loud snorer, sister not affected.  Social history: no children, lives with girlfriend,  He smoked in the past - quit years ago, girlfriend smokes a lot. ETOH-  Hard liquor on occsasions.    Wine with some dinner.  Caffeine ; rarely one a week/ for sodas now, but used to consume diet coke a lot.    Review of Systems: Out of a complete 14 system review, the patient complains of only the following symptoms, and all other reviewed systems are negative.  Snoring, EDS , nocturia, only 5 hours of sleep.  Back pain, but not sleep deprived - exercise intolerence, atrial fib new onset intraoperatively.   Epworth score 12, Fatigue severity score 24  , depression score N/A-  Not anxious- but a little tense.    Social History   Socioeconomic History  . Marital status: Single    Spouse name: Not on file  . Number of children: Not on file  . Years of education: Not on file  . Highest education level: Not on file  Occupational History  . Not on file  Social Needs  . Financial resource strain: Not on file  . Food insecurity:    Worry: Not on file    Inability: Not on file  . Transportation needs:    Medical: Not on file    Non-medical: Not on file  Tobacco Use  . Smoking status: Light Tobacco Smoker    Types: Cigarettes    Start date: 10/29/2008  . Smokeless tobacco: Never Used  Substance and Sexual Activity  . Alcohol use: Yes    Alcohol/week: 3.0 standard drinks    Types: 3 Shots of liquor per week  . Drug use: No  . Sexual activity: Not Currently  Lifestyle  . Physical activity:    Days per week: Not on file    Minutes per session: Not on file  . Stress: Not on file  Relationships  . Social connections:    Talks on phone: Not on file    Gets together: Not on file    Attends religious service: Not on file    Active member of club or organization: Not on file    Attends meetings of clubs or organizations: Not on file    Relationship status: Not on file  . Intimate partner violence:    Fear of current or ex partner: Not on file    Emotionally abused: Not on file    Physically abused: Not on file    Forced sexual activity: Not on file  Other Topics Concern  .  Not on file  Social History Narrative   Lives alone in a 2 story home.  Has no children.     Works as a Hotel manager.     Education: college degree.    Family History  Problem Relation Age of Onset  . Alcohol abuse Father   . Dementia Father   . Cancer  Neg Hx   . Depression Neg Hx   . Diabetes Neg Hx   . Drug abuse Neg Hx   . Early death Neg Hx   . Heart disease Neg Hx   . Hyperlipidemia Neg Hx   . Hypertension Neg Hx   . Kidney disease Neg Hx   . Stroke Neg Hx     Past Medical History:  Diagnosis Date  . Basal cell carcinoma    Back  . Hypertension   . Sleep apnea    has appointment in October 2019 to get CPAP    Past Surgical History:  Procedure Laterality Date  . KNEE SURGERY Right   . LUMBAR LAMINECTOMY/DECOMPRESSION MICRODISCECTOMY Left 04/22/2018   Procedure: Left Lumbar Five-Sacral One Microdiscectomy;  Surgeon: Erline Levine, MD;  Location: [facility];  Service: Neurosurgery;  Laterality: Left;  Left Lumbar 5 Sacral 1 Microdiscectomy  . SHOULDER ARTHROSCOPY Left     Current Outpatient Medications  Medication Sig Dispense Refill  . Azilsartan-Chlorthalidone (EDARBYCLOR) 40-25 MG TABS Take 1 tablet by mouth daily. 70 tablet 0  . Cholecalciferol 50000 units capsule Take 1 capsule (50,000 Units total) by mouth once a week. (Patient taking differently: Take 50,000 Units by mouth every Friday. ) 12 capsule 1  . gabapentin (NEURONTIN) 100 MG capsule Take 2 capsules (200 mg total) by mouth at bedtime. (Patient taking differently: Take 200 mg by mouth every morning. ) 90 capsule 0  . levocetirizine (XYZAL) 5 MG tablet Take 1 tablet (5 mg total) by mouth every evening. 90 tablet 1  . methocarbamol (ROBAXIN) 500 MG tablet Take 1 tablet (500 mg total) by mouth every 6 (six) hours as needed for muscle spasms. 50 tablet 0  . nebivolol (BYSTOLIC) 5 MG tablet Take 1 tablet (5 mg total) by mouth daily. 84 tablet 0  . rosuvastatin (CRESTOR) 10 MG tablet Take 1 tablet (10 mg total) by  mouth daily. 90 tablet 1  . HYDROcodone-acetaminophen (NORCO/VICODIN) 5-325 MG tablet Take 1-2 tablets by mouth every 4 (four) hours as needed for moderate pain ((score 4 to 6)). (Patient not taking: Reported on 05/03/2018) 30 tablet 0   No current facility-administered medications for this visit.     Allergies as of 05/03/2018  . (No Known Allergies)    Vitals: BP (!) 145/99   Pulse 87   Ht 6' (1.829 m)   Wt 249 lb (112.9 kg)   BMI 33.77 kg/m  Last Weight:  Wt Readings from Last 1 Encounters:  05/03/18 249 lb (112.9 kg)   NID:POEU mass index is 33.77 kg/m.     Last Height:   Ht Readings from Last 1 Encounters:  05/03/18 6' (1.829 m)    Physical exam:  General: The patient is awake, alert and appears not in acute distress. The patient is well groomed. Head: Normocephalic, atraumatic. Neck is supple.  Mallampati 5, large tongue in midline.  neck circumference:19. Nasal airflow patent -Retrognathia is mild.   Overbite.   Cardiovascular:  Regular rate and rhythm , without  murmurs or carotid bruit, and without distended neck veins. Respiratory: Lungs are clear to auscultation. Skin:  Without evidence of edema, or rash Trunk: BMI is 33. The patient's posture is stooped.   Neurologic exam : The patient is awake and alert, oriented to place and time.     Cranial nerves: Pupils are equal and briskly reactive to light. Visual fields by finger perimetry are intact. Hearing to finger rub intact.  Facial sensation intact to fine touch.Facial  motor strength is symmetric and tongue and uvula move midline. Shoulder shrug was symmetrical.  Sensory: Pain in both knees and Neuropathic pain in both feet,  all toes. Fine touch, pinprick and vibration were absent in all of the toes.   pain in the left lower leg.   Motor exam: Normal tone, muscle bulk and symmetric strength in all extremities. Coordination: Finger-to-nose maneuver without evidence of ataxia, dysmetria or tremor. Gait and  station: Patient walks without assistive device. He was given a cane - but doesn't use it... Turns with 3-4 Steps. Romberg testing is negative. Deep tendon reflexes: in the  upper and lower extremities are symmetric and intact.   Assessment:  After physical and neurologic examination, review of laboratory studies,  Personal review of imaging studies, reports of other /same  Imaging studies, results of polysomnography and / or neurophysiology testing and pre-existing records as far as provided in visit., my assessment is:   1) [name] has mild sleep apnea associated with hypoxemia- and developed atrial fibrillation intraoperatively during back surgery. He now declared he never got the phone call, letters  And messages related to his prescription for a CPAP auto titration. I will renew this today. He was basically an untreated patient at the time of surgery.  Marland Kitchen   His hypertension has been poor controlled and now Bystolic had to be added to Diovan and hydrochlorothiazide.  It appeared controlled this morning. Under [employer] coverage only home sleep tests were permitted.  . The patient was advised of the nature of the diagnosed disorder , the treatment options and the  risks for general health and wellness arising from not treating the condition.   I spent more than 25 minutes of face to face time with the patient.  Greater than 50% of time was spent in counseling and coordination of care. We have discussed the diagnosis and differential and I answered the patient's questions.    Plan:  Treatment plan and additional workup : Auto- titration, please try to avoid a FFM- .  Settings with heated humidity already on order. Needs ONO on CPAP in that time frame.   RV after 60-90 days of use- NP   Larey Seat, MD 81/02/5630, 4:97 AM  Certified in Neurology by ABPN Certified in [place] by [facility] [address], [place], [place]  [address]

## 2018-05-04 ENCOUNTER — Encounter (HOSPITAL_COMMUNITY): Payer: Self-pay | Admitting: Nurse Practitioner

## 2018-05-04 ENCOUNTER — Ambulatory Visit (HOSPITAL_COMMUNITY)
Admission: RE | Admit: 2018-05-04 | Discharge: 2018-05-04 | Disposition: A | Discharge status: Home / Self Care | Payer: Managed Care, Other (non HMO) | Source: Ambulatory Visit | Attending: Nurse Practitioner | Admitting: Nurse Practitioner

## 2018-05-04 ENCOUNTER — Other Ambulatory Visit (HOSPITAL_COMMUNITY): Payer: Self-pay | Admitting: *Deleted

## 2018-05-04 VITALS — BP 106/74 | HR 89 | Ht 72.0 in | Wt 254.0 lb

## 2018-05-04 DIAGNOSIS — M961 Postlaminectomy syndrome, not elsewhere classified: Secondary | ICD-10-CM | POA: Insufficient documentation

## 2018-05-04 DIAGNOSIS — Z6834 Body mass index (BMI) 34.0-34.9, adult: Secondary | ICD-10-CM | POA: Insufficient documentation

## 2018-05-04 DIAGNOSIS — I77810 Thoracic aortic ectasia: Secondary | ICD-10-CM

## 2018-05-04 DIAGNOSIS — I483 Typical atrial flutter: Secondary | ICD-10-CM | POA: Insufficient documentation

## 2018-05-04 DIAGNOSIS — Z7901 Long term (current) use of anticoagulants: Secondary | ICD-10-CM | POA: Insufficient documentation

## 2018-05-04 DIAGNOSIS — E669 Obesity, unspecified: Secondary | ICD-10-CM | POA: Diagnosis not present

## 2018-05-04 DIAGNOSIS — F17210 Nicotine dependence, cigarettes, uncomplicated: Secondary | ICD-10-CM | POA: Insufficient documentation

## 2018-05-04 DIAGNOSIS — G4733 Obstructive sleep apnea (adult) (pediatric): Secondary | ICD-10-CM | POA: Diagnosis not present

## 2018-05-04 DIAGNOSIS — Z85828 Personal history of other malignant neoplasm of skin: Secondary | ICD-10-CM | POA: Diagnosis not present

## 2018-05-04 DIAGNOSIS — Z79899 Other long term (current) drug therapy: Secondary | ICD-10-CM | POA: Diagnosis not present

## 2018-05-04 DIAGNOSIS — I10 Essential (primary) hypertension: Secondary | ICD-10-CM | POA: Diagnosis not present

## 2018-05-04 MED ORDER — RIVAROXABAN 20 MG PO TABS: 20.0000 mg | ORAL_TABLET | Freq: Every day | ORAL | 0 refills | Status: DC

## 2018-05-04 MED ORDER — RIVAROXABAN 20 MG PO TABS: 20.0000 mg | ORAL_TABLET | Freq: Every day | ORAL | 3 refills | Status: DC

## 2018-05-04 NOTE — Patient Instructions (Signed)
Start Xarelto 20mg  once a day with supper (after we confirm with Dr. Vertell Limber)

## 2018-05-04 NOTE — Progress Notes (Addendum)
Primary Care Physician: Janith Lima, MD Primary Cardiologist: Marlou Porch Primary Electrophysiologist: Allred Referring Physician: Ludger Nutting is a 62 y.o. male with a history of atrial flutter who presents for consultation in the [facility].  The patient was initially diagnosed with atrial flutter 04/22/18 during back surgery.  He has remained in typical atrial flutter since.  He was seen by Dr Rayann Heman who recommended anticoagulation when ok by neurosurgery with eventual flutter ablation.  Since discharge, he is doing well. Back soreness but leg pain is improved.  He is not having symptoms related to atrial flutter.   Today, he denies symptoms of palpitations, chest pain, shortness of breath, orthopnea, PND, lower extremity edema, dizziness, presyncope, syncope, snoring, daytime somnolence, bleeding, or neurologic sequela. The patient is tolerating medications without difficulties and is otherwise without complaint today.    Atrial Fibrillation Risk Factors:  he does have symptoms or diagnosis of sleep apnea. He is intolerant of CPAP - nasal pillows being obtained.   he does not have a history of rheumatic fever.   he has a BMI of Body mass index is 34.45 kg/m.Marland Kitchen Filed Weights   05/04/18 0836  Weight: 115.2 kg    LA size: 39   Atrial Fibrillation Management history:  Previous antiarrhythmic drugs: none  Previous cardioversions: none  Previous ablations: none  CHADS2VASC score: 1  Anticoagulation history: none   Past Medical History:  Diagnosis Date  . Basal cell carcinoma    Back  . Hypertension   . Sleep apnea    has appointment in October 2019 to get CPAP   Past Surgical History:  Procedure Laterality Date  . KNEE SURGERY Right   . LUMBAR LAMINECTOMY/DECOMPRESSION MICRODISCECTOMY Left 04/22/2018   Procedure: Left Lumbar Five-Sacral One Microdiscectomy;  Surgeon: Erline Levine, MD;  Location: [facility];  Service: Neurosurgery;   Laterality: Left;  Left Lumbar 5 Sacral 1 Microdiscectomy  . SHOULDER ARTHROSCOPY Left     Current Outpatient Medications  Medication Sig Dispense Refill  . Azilsartan-Chlorthalidone (EDARBYCLOR) 40-25 MG TABS Take 1 tablet by mouth daily. 70 tablet 0  . Cholecalciferol 50000 units capsule Take 1 capsule (50,000 Units total) by mouth once a week. (Patient taking differently: Take 50,000 Units by mouth every Friday. ) 12 capsule 1  . gabapentin (NEURONTIN) 100 MG capsule Take 2 capsules (200 mg total) by mouth at bedtime. (Patient taking differently: Take 200 mg by mouth every morning. ) 90 capsule 0  . HYDROcodone-acetaminophen (NORCO/VICODIN) 5-325 MG tablet Take 1-2 tablets by mouth every 4 (four) hours as needed for moderate pain ((score 4 to 6)). 30 tablet 0  . levocetirizine (XYZAL) 5 MG tablet Take 1 tablet (5 mg total) by mouth every evening. 90 tablet 1  . methocarbamol (ROBAXIN) 500 MG tablet Take 1 tablet (500 mg total) by mouth every 6 (six) hours as needed for muscle spasms. 50 tablet 0  . nebivolol (BYSTOLIC) 5 MG tablet Take 1 tablet (5 mg total) by mouth daily. 84 tablet 0  . rosuvastatin (CRESTOR) 10 MG tablet Take 1 tablet (10 mg total) by mouth daily. 90 tablet 1  . rivaroxaban (XARELTO) 20 MG TABS tablet Take 1 tablet (20 mg total) by mouth daily with supper. 30 tablet 0   No current facility-administered medications for this encounter.     No Known Allergies  Social History   Socioeconomic History  . Marital status: Single    Spouse name: Not on file  .  Number of children: Not on file  . Years of education: Not on file  . Highest education level: Not on file  Occupational History  . Not on file  Social Needs  . Financial resource strain: Not on file  . Food insecurity:    Worry: Not on file    Inability: Not on file  . Transportation needs:    Medical: Not on file    Non-medical: Not on file  Tobacco Use  . Smoking status: Light Tobacco Smoker    Types:  Cigarettes    Start date: 10/29/2008  . Smokeless tobacco: Never Used  Substance and Sexual Activity  . Alcohol use: Yes    Alcohol/week: 3.0 standard drinks    Types: 3 Shots of liquor per week  . Drug use: No  . Sexual activity: Not Currently  Lifestyle  . Physical activity:    Days per week: Not on file    Minutes per session: Not on file  . Stress: Not on file  Relationships  . Social connections:    Talks on phone: Not on file    Gets together: Not on file    Attends religious service: Not on file    Active member of club or organization: Not on file    Attends meetings of clubs or organizations: Not on file    Relationship status: Not on file  . Intimate partner violence:    Fear of current or ex partner: Not on file    Emotionally abused: Not on file    Physically abused: Not on file    Forced sexual activity: Not on file  Other Topics Concern  . Not on file  Social History Narrative   Lives alone in a 2 story home.  Has no children.     Works as a Hotel manager.     Education: college degree.    Family History  Problem Relation Age of Onset  . Alcohol abuse Father   . Dementia Father   . Cancer Neg Hx   . Depression Neg Hx   . Diabetes Neg Hx   . Drug abuse Neg Hx   . Early death Neg Hx   . Heart disease Neg Hx   . Hyperlipidemia Neg Hx   . Hypertension Neg Hx   . Kidney disease Neg Hx   . Stroke Neg Hx    The patient does have a history of early familial atrial fibrillation or other arrhythmias.  ROS- All systems are reviewed and negative except as per the HPI above.  Physical Exam: Vitals:   05/04/18 0836  BP: 106/74  Pulse: 89  Weight: 115.2 kg  Height: 6' (1.829 m)    GEN- The patient is well appearing, alert and oriented x 3 today.   Head- normocephalic, atraumatic Eyes-  Sclera clear, conjunctiva pink Ears- hearing intact Oropharynx- clear Neck- supple  Lungs- Clear to ausculation bilaterally, normal work of breathing Heart- Regular rate  and rhythm, no murmurs, rubs or gallops  GI- soft, NT, ND, + BS Extremities- no clubbing, cyanosis, or edema MS- no significant deformity or atrophy Skin- no rash or lesion Psych- euthymic mood, full affect Neuro- strength and sensation are intact  Wt Readings from Last 3 Encounters:  05/04/18 115.2 kg  05/03/18 112.9 kg  04/23/18 116.9 kg    EKG today demonstrates atrial flutter, rate 89, iRBBB Echo 03/2018 demonstrated EF 55-60%, no RWMA, mildly dilated aortic root  Epic records are reviewed at length today  Assessment and  Plan:  1. Typical atrial flutter   Identified during recent back surgery. Will need clearance from neurosurgery to start anticoagulation. I have reached out to Dr Melven Sartorius office for clearance If he agrees, will start Xarelto 20mg  daily Will need 3 weeks of anticoagulation prior to ablation. We reviewed flutter ablation procedure, risks, benefits today who wishes to proceed. He does not remember Dr Rayann Heman from hospital stay - will arrange follow up in a couple of weeks to schedule ablation.   2.  Obesity Body mass index is 34.45 kg/m. Weight loss recommended  3.  OSA Intolerant of CPAP He has information to contact company for nasal pillows  4.  Dilated aortic root Further imaging recommended by echo read Will defer timing of CTA vs MRI to Dr Marlou Porch    Chanetta Marshall, NP 05/04/2018 9:06 AM   Addendum: Felipa Evener back from Dr Vertell Limber through [place] message, he is ok with starting Xarelto. Patient notified.  Chanetta Marshall, NP 05/05/2018 3:25 PM

## 2018-05-05 NOTE — Addendum Note (Signed)
Encounter addended by: Patsey Berthold, NP on: 05/05/2018 3:26 PM  Actions taken: Sign clinical note

## 2018-05-07 ENCOUNTER — Other Ambulatory Visit: Payer: Self-pay | Admitting: Internal Medicine

## 2018-05-07 DIAGNOSIS — I10 Essential (primary) hypertension: Secondary | ICD-10-CM

## 2018-05-11 ENCOUNTER — Encounter: Payer: Self-pay | Admitting: Cardiology

## 2018-05-11 ENCOUNTER — Ambulatory Visit (INDEPENDENT_AMBULATORY_CARE_PROVIDER_SITE_OTHER): Payer: Managed Care, Other (non HMO) | Admitting: Cardiology

## 2018-05-11 VITALS — BP 120/84 | HR 48 | Ht 72.0 in | Wt 248.0 lb

## 2018-05-11 DIAGNOSIS — G4733 Obstructive sleep apnea (adult) (pediatric): Secondary | ICD-10-CM | POA: Diagnosis not present

## 2018-05-11 DIAGNOSIS — I77810 Thoracic aortic ectasia: Secondary | ICD-10-CM | POA: Diagnosis not present

## 2018-05-11 DIAGNOSIS — I483 Typical atrial flutter: Secondary | ICD-10-CM | POA: Diagnosis not present

## 2018-05-11 NOTE — Progress Notes (Signed)
Cardiology Office Note:    Date:  05/11/2018   ID:  Henry Figueroa, DOB Sep 14, 1955, MRN [id number]  PCP:  Janith Lima, MD  Cardiologist:  Candee Furbish, MD  Electrophysiologist:  None   Referring MD: Janith Lima, MD     History of Present Illness:    Henry Figueroa is a 62 y.o. male with typical atrial flutter, Dr. Rayann Heman has seen as well as atrial fibrillation clinic here for follow-up.  I had seen him back in consultation in the hospital setting in September 2019 in the setting of new onset atrial flutter.  Typical.  Has been intolerant of CPAP.  History of rheumatic fever.  He was diagnosed with atrial flutter back in September 2019 during a back surgery.  He has had a typical atrial flutter since.  Dr. Rayann Heman saw him and recommended anticoagulation when okayed by neurosurgery with eventual flutter ablation.  Dr. Melven Sartorius office has been contacted if he agrees, Xarelto 20 mg per he would need 3 weeks of anti-correlation prior to ablation this was reviewed with Roderic Palau. He has started Xarelto 05/09/18. Sister is Henry Figueroa who used to work on the epic team, recently retired.  Mother had seen Dr. Rayann Heman AFIB.  Denies any chest pain fevers chills nausea vomiting syncope.  Recovering well from back surgery.       Past Medical History:  Diagnosis Date  . Basal cell carcinoma    Back  . Hypertension   . Sleep apnea    has appointment in October 2019 to get CPAP    Past Surgical History:  Procedure Laterality Date  . KNEE SURGERY Right   . LUMBAR LAMINECTOMY/DECOMPRESSION MICRODISCECTOMY Left 04/22/2018   Procedure: Left Lumbar Five-Sacral One Microdiscectomy;  Surgeon: Erline Levine, MD;  Location: [facility];  Service: Neurosurgery;  Laterality: Left;  Left Lumbar 5 Sacral 1 Microdiscectomy  . SHOULDER ARTHROSCOPY Left     Current Medications: Current Meds  Medication Sig  . Azilsartan-Chlorthalidone (EDARBYCLOR) 40-25 MG TABS Take 1 tablet by mouth daily.  .  Cholecalciferol 50000 units capsule Take 1 capsule (50,000 Units total) by mouth once a week.  . gabapentin (NEURONTIN) 100 MG capsule Take 100 mg by mouth daily.  Marland Kitchen levocetirizine (XYZAL) 5 MG tablet Take 1 tablet (5 mg total) by mouth every evening.  . methocarbamol (ROBAXIN) 500 MG tablet Take 1 tablet (500 mg total) by mouth every 6 (six) hours as needed for muscle spasms.  . nebivolol (BYSTOLIC) 5 MG tablet Take 1 tablet (5 mg total) by mouth daily.  . rivaroxaban (XARELTO) 20 MG TABS tablet Take 1 tablet (20 mg total) by mouth daily with supper.  . rosuvastatin (CRESTOR) 10 MG tablet Take 1 tablet (10 mg total) by mouth daily.  . traMADol (ULTRAM) 50 MG tablet Take 50 mg by mouth daily.  . [DISCONTINUED] gabapentin (NEURONTIN) 100 MG capsule Take 2 capsules (200 mg total) by mouth at bedtime. (Patient taking differently: Take 200 mg by mouth daily. )     Allergies:   Patient has no known allergies.   Social History   Socioeconomic History  . Marital status: Single    Spouse name: Not on file  . Number of children: Not on file  . Years of education: Not on file  . Highest education level: Not on file  Occupational History  . Not on file  Social Needs  . Financial resource strain: Not on file  . Food insecurity:    Worry: Not on file  Inability: Not on file  . Transportation needs:    Medical: Not on file    Non-medical: Not on file  Tobacco Use  . Smoking status: Light Tobacco Smoker    Types: Cigarettes    Start date: 10/29/2008  . Smokeless tobacco: Never Used  Substance and Sexual Activity  . Alcohol use: Yes    Alcohol/week: 3.0 standard drinks    Types: 3 Shots of liquor per week  . Drug use: No  . Sexual activity: Not Currently  Lifestyle  . Physical activity:    Days per week: Not on file    Minutes per session: Not on file  . Stress: Not on file  Relationships  . Social connections:    Talks on phone: Not on file    Gets together: Not on file    Attends  religious service: Not on file    Active member of club or organization: Not on file    Attends meetings of clubs or organizations: Not on file    Relationship status: Not on file  Other Topics Concern  . Not on file  Social History Narrative   Lives alone in a 2 story home.  Has no children.     Works as a Hotel manager.     Education: college degree.     Family History: The patient's family history includes Alcohol abuse in his father; Dementia in his father. There is no history of Cancer, Depression, Diabetes, Drug abuse, Early death, Heart disease, Hyperlipidemia, Hypertension, Kidney disease, or Stroke.  ROS:   Please see the history of present illness.     All other systems reviewed and are negative.  EKGs/Labs/Other Studies Reviewed:    The following studies were reviewed today:  Echocardiogram 04/23/2018: - Left ventricle: The cavity size was normal. Wall thickness was   increased in a pattern of mild LVH. Systolic function was normal.   The estimated ejection fraction was in the range of 55% to 60%.   Wall motion was normal; there were no regional wall motion   abnormalities. The study is not technically sufficient to allow   evaluation of LV diastolic function. - Aortic valve: There was mild regurgitation. - Aortic root: The aortic root was mildly dilated.  Impressions:  - Normal LV function; mild LVH; mildly dilated aortic root (3.8 cm)   and ascending aorta (4.5 cm); mild AI; suggest CTA or MRA to   further assess aorta.  EKG:  EKG is not ordered today.    Recent Labs: 10/29/2017: ALT 16; TSH 0.74 04/20/2018: BUN 20; Creatinine, Ser 1.03; Hemoglobin 16.8; Platelets 207; Potassium 3.5; Sodium 140  Recent Lipid Panel    Component Value Date/Time   CHOL 168 10/29/2017 1557   TRIG 110.0 10/29/2017 1557   HDL 72.00 10/29/2017 1557   CHOLHDL 2 10/29/2017 1557   VLDL 22.0 10/29/2017 1557   LDLCALC 74 10/29/2017 1557   LDLDIRECT 89.0 10/19/2016 1630    Physical  Exam:    VS:  BP 120/84   Pulse (!) 48   Ht 6' (1.829 m)   Wt 248 lb (112.5 kg)   SpO2 (!) 82%   BMI 33.63 kg/m     Wt Readings from Last 3 Encounters:  05/11/18 248 lb (112.5 kg)  05/04/18 254 lb (115.2 kg)  05/03/18 249 lb (112.9 kg)     GEN:  Well nourished, well developed in no acute distress HEENT: Normal NECK: No JVD; No carotid bruits LYMPHATICS: No lymphadenopathy CARDIAC: RRR, no  murmurs, rubs, gallops RESPIRATORY:  Clear to auscultation without rales, wheezing or rhonchi  ABDOMEN: Soft, non-tender, non-distended MUSCULOSKELETAL:  No edema; No deformity  SKIN: Warm and dry NEUROLOGIC:  Alert and oriented x 3 PSYCHIATRIC:  Normal affect   ASSESSMENT:    1. Typical atrial flutter ([place])   2. OSA (obstructive sleep apnea)   3. Dilated aortic root ([facility])    PLAN:    In order of problems listed above:  Dilated aortic root -4.5 cm- we will check a CT of his aorta.  Avoid Cipro.  Continue with blood pressure control.  Typical atrial flutter -Started Xarelto on 05/09/2018.  He will be discussing further with Dr. Rayann Heman later this month.  Ablative therapy.  Hypertension essential -Continue with Bystolic.  Well-controlled.  Obesity -Continue to encourage weight loss  Obstructive sleep apnea -Intolerant of CPAP.  Discussing nasal pillows   Medication Adjustments/Labs and Tests Ordered: Current medicines are reviewed at length with the patient today.  Concerns regarding medicines are outlined above.  No orders of the defined types were placed in this encounter.  No orders of the defined types were placed in this encounter.   Patient Instructions  Medication Instructions:  .intcur  If you need a refill on your cardiac medications before your next appointment, please call your pharmacy.   Lab work: none If you have labs (blood work) drawn today and your tests are completely normal, you will receive your results only by: Marland Kitchen MyChart Message (if you have  MyChart) OR . A paper copy in the mail If you have any lab test that is abnormal or we need to change your treatment, we will call you to review the results.  Testing/Procedures:   Follow-Up: At [facility], you and your health needs are our priority.  As part of our continuing mission to provide you with exceptional heart care, we have created designated Provider Care Teams.  These Care Teams include your primary Cardiologist (physician) and Advanced Practice Providers (APPs -  Physician Assistants and Nurse Practitioners) who all work together to provide you with the care you need, when you need it. You will need a follow up appointment in 12 months.  Please call our office 2 months in advance to schedule this appointment.  You may see Candee Furbish, MD or one of the following Advanced Practice Providers on your designated Care Team:   Truitt Merle, NP Cecilie Kicks, NP . Kathyrn Drown, NP  Any Other Special Instructions Will Be Listed Below (If Applicable). CT aorta--needs scheduled.       Signed, Candee Furbish, MD  05/11/2018 12:38 PM    [place]

## 2018-05-11 NOTE — Patient Instructions (Addendum)
Medication Instructions:  .intcur  If you need a refill on your cardiac medications before your next appointment, please call your pharmacy.   Lab work: none If you have labs (blood work) drawn today and your tests are completely normal, you will receive your results only by: Marland Kitchen MyChart Message (if you have MyChart) OR . A paper copy in the mail If you have any lab test that is abnormal or we need to change your treatment, we will call you to review the results.  Testing/Procedures:   Follow-Up: At [facility], you and your health needs are our priority.  As part of our continuing mission to provide you with exceptional heart care, we have created designated Provider Care Teams.  These Care Teams include your primary Cardiologist (physician) and Advanced Practice Providers (APPs -  Physician Assistants and Nurse Practitioners) who all work together to provide you with the care you need, when you need it. You will need a follow up appointment in 12 months.  Please call our office 2 months in advance to schedule this appointment.  You may see Candee Furbish, MD or one of the following Advanced Practice Providers on your designated Care Team:   Truitt Merle, NP Cecilie Kicks, NP . Kathyrn Drown, NP  Any Other Special Instructions Will Be Listed Below (If Applicable). CT aorta--needs scheduled.

## 2018-05-18 ENCOUNTER — Encounter: Payer: Self-pay | Admitting: Internal Medicine

## 2018-05-18 ENCOUNTER — Ambulatory Visit (INDEPENDENT_AMBULATORY_CARE_PROVIDER_SITE_OTHER): Payer: Managed Care, Other (non HMO) | Admitting: Internal Medicine

## 2018-05-18 VITALS — BP 106/70 | HR 86 | Ht 72.0 in | Wt 249.4 lb

## 2018-05-18 DIAGNOSIS — Z23 Encounter for immunization: Secondary | ICD-10-CM | POA: Diagnosis not present

## 2018-05-18 DIAGNOSIS — I483 Typical atrial flutter: Secondary | ICD-10-CM | POA: Diagnosis not present

## 2018-05-18 DIAGNOSIS — G4733 Obstructive sleep apnea (adult) (pediatric): Secondary | ICD-10-CM | POA: Diagnosis not present

## 2018-05-18 DIAGNOSIS — E669 Obesity, unspecified: Secondary | ICD-10-CM

## 2018-05-18 DIAGNOSIS — I77810 Thoracic aortic ectasia: Secondary | ICD-10-CM

## 2018-05-18 NOTE — H&P (View-Only) (Signed)
PCP: Janith Lima, MD Primary Cardiologist: Dr Marlou Porch Primary EP: Dr Rayann Heman  Henry Figueroa is a 62 y.o. male who presents today for routine electrophysiology followup.  I saw him in the hospital 04/23/18 when he had an episode of atrial flutter.  (my note reviewed today).  He had just had back surgery.  He is making good recovery but remains in atrial flutter.  Today, he denies symptoms of palpitations, chest pain, shortness of breath,  lower extremity edema, dizziness, presyncope, or syncope.  The patient is otherwise without complaint today.   Past Medical History:  Diagnosis Date  . Basal cell carcinoma    Back  . Hypertension   . Sleep apnea    has appointment in October 2019 to get CPAP   Past Surgical History:  Procedure Laterality Date  . KNEE SURGERY Right   . LUMBAR LAMINECTOMY/DECOMPRESSION MICRODISCECTOMY Left 04/22/2018   Procedure: Left Lumbar Five-Sacral One Microdiscectomy;  Surgeon: Erline Levine, MD;  Location: [facility];  Service: Neurosurgery;  Laterality: Left;  Left Lumbar 5 Sacral 1 Microdiscectomy  . SHOULDER ARTHROSCOPY Left     ROS- all systems are reviewed and negatives except as per HPI above  Current Outpatient Medications  Medication Sig Dispense Refill  . Azilsartan-Chlorthalidone (EDARBYCLOR) 40-25 MG TABS Take 1 tablet by mouth daily. 70 tablet 0  . Cholecalciferol 50000 units capsule Take 1 capsule (50,000 Units total) by mouth once a week. 12 capsule 1  . gabapentin (NEURONTIN) 100 MG capsule Take 100 mg by mouth daily.    Marland Kitchen levocetirizine (XYZAL) 5 MG tablet Take 1 tablet (5 mg total) by mouth every evening. 90 tablet 1  . methocarbamol (ROBAXIN) 500 MG tablet Take 1 tablet (500 mg total) by mouth every 6 (six) hours as needed for muscle spasms. 50 tablet 0  . nebivolol (BYSTOLIC) 5 MG tablet Take 1 tablet (5 mg total) by mouth daily. 84 tablet 0  . rivaroxaban (XARELTO) 20 MG TABS tablet Take 1 tablet (20 mg total) by mouth daily with supper. 30  tablet 3  . rosuvastatin (CRESTOR) 10 MG tablet Take 1 tablet (10 mg total) by mouth daily. 90 tablet 1  . traMADol (ULTRAM) 50 MG tablet Take 50 mg by mouth daily.     No current facility-administered medications for this visit.     Physical Exam: Vitals:   05/18/18 1438  BP: 106/70  Pulse: 86  SpO2: 99%  Weight: 249 lb 6.4 oz (113.1 kg)  Height: 6' (1.829 m)    GEN- The patient is well appearing, alert and oriented x 3 today.   Head- normocephalic, atraumatic Eyes-  Sclera clear, conjunctiva pink Ears- hearing intact Oropharynx- clear Lungs- Clear to ausculation bilaterally, normal work of breathing Heart- irregular rate and rhythm, no murmurs, rubs or gallops, PMI not laterally displaced GI- soft, NT, ND, + BS Extremities- no clubbing, cyanosis, or edema  Wt Readings from Last 3 Encounters:  05/18/18 249 lb 6.4 oz (113.1 kg)  05/11/18 248 lb (112.5 kg)  05/04/18 254 lb (115.2 kg)    EKG tracing ordered today is personally reviewed and shows typical appearing atrial flutter, V rate 86 bpm  Assessment and Plan:  1. typical atrial flutter Therapeutic strategies for atrial flutter including medicine and ablation were discussed in detail with the patient today. Risk, benefits, and alternatives to EP study and radiofrequency ablation were also discussed in detail today. These risks include but are not limited to stroke, bleeding, vascular damage, tamponade, perforation, damage  to the heart and other structures, AV block requiring pacemaker, worsening renal function, and death. The patient understands these risk and wishes to proceed.  He started xarelto 05/09/18.  We will proceed with ablation after appropriate anticoagulation.  2. OSA Uses CPAP  3. HTN Stable No change required today  Thompson Grayer MD, [facility] 05/18/2018 2:57 PM

## 2018-05-18 NOTE — Progress Notes (Signed)
PCP: Janith Lima, MD Primary Cardiologist: Dr Marlou Porch Primary EP: Dr Rayann Heman  Henry Figueroa is a 62 y.o. male who presents today for routine electrophysiology followup.  I saw him in the hospital 04/23/18 when he had an episode of atrial flutter.  (my note reviewed today).  He had just had back surgery.  He is making good recovery but remains in atrial flutter.  Today, he denies symptoms of palpitations, chest pain, shortness of breath,  lower extremity edema, dizziness, presyncope, or syncope.  The patient is otherwise without complaint today.   Past Medical History:  Diagnosis Date  . Basal cell carcinoma    Back  . Hypertension   . Sleep apnea    has appointment in October 2019 to get CPAP   Past Surgical History:  Procedure Laterality Date  . KNEE SURGERY Right   . LUMBAR LAMINECTOMY/DECOMPRESSION MICRODISCECTOMY Left 04/22/2018   Procedure: Left Lumbar Five-Sacral One Microdiscectomy;  Surgeon: Erline Levine, MD;  Location: [facility];  Service: Neurosurgery;  Laterality: Left;  Left Lumbar 5 Sacral 1 Microdiscectomy  . SHOULDER ARTHROSCOPY Left     ROS- all systems are reviewed and negatives except as per HPI above  Current Outpatient Medications  Medication Sig Dispense Refill  . Azilsartan-Chlorthalidone (EDARBYCLOR) 40-25 MG TABS Take 1 tablet by mouth daily. 70 tablet 0  . Cholecalciferol 50000 units capsule Take 1 capsule (50,000 Units total) by mouth once a week. 12 capsule 1  . gabapentin (NEURONTIN) 100 MG capsule Take 100 mg by mouth daily.    Marland Kitchen levocetirizine (XYZAL) 5 MG tablet Take 1 tablet (5 mg total) by mouth every evening. 90 tablet 1  . methocarbamol (ROBAXIN) 500 MG tablet Take 1 tablet (500 mg total) by mouth every 6 (six) hours as needed for muscle spasms. 50 tablet 0  . nebivolol (BYSTOLIC) 5 MG tablet Take 1 tablet (5 mg total) by mouth daily. 84 tablet 0  . rivaroxaban (XARELTO) 20 MG TABS tablet Take 1 tablet (20 mg total) by mouth daily with supper. 30  tablet 3  . rosuvastatin (CRESTOR) 10 MG tablet Take 1 tablet (10 mg total) by mouth daily. 90 tablet 1  . traMADol (ULTRAM) 50 MG tablet Take 50 mg by mouth daily.     No current facility-administered medications for this visit.     Physical Exam: Vitals:   05/18/18 1438  BP: 106/70  Pulse: 86  SpO2: 99%  Weight: 249 lb 6.4 oz (113.1 kg)  Height: 6' (1.829 m)    GEN- The patient is well appearing, alert and oriented x 3 today.   Head- normocephalic, atraumatic Eyes-  Sclera clear, conjunctiva pink Ears- hearing intact Oropharynx- clear Lungs- Clear to ausculation bilaterally, normal work of breathing Heart- irregular rate and rhythm, no murmurs, rubs or gallops, PMI not laterally displaced GI- soft, NT, ND, + BS Extremities- no clubbing, cyanosis, or edema  Wt Readings from Last 3 Encounters:  05/18/18 249 lb 6.4 oz (113.1 kg)  05/11/18 248 lb (112.5 kg)  05/04/18 254 lb (115.2 kg)    EKG tracing ordered today is personally reviewed and shows typical appearing atrial flutter, V rate 86 bpm  Assessment and Plan:  1. typical atrial flutter Therapeutic strategies for atrial flutter including medicine and ablation were discussed in detail with the patient today. Risk, benefits, and alternatives to EP study and radiofrequency ablation were also discussed in detail today. These risks include but are not limited to stroke, bleeding, vascular damage, tamponade, perforation, damage  to the heart and other structures, AV block requiring pacemaker, worsening renal function, and death. The patient understands these risk and wishes to proceed.  He started xarelto 05/09/18.  We will proceed with ablation after appropriate anticoagulation.  2. OSA Uses CPAP  3. HTN Stable No change required today  Thompson Grayer MD, [facility] 05/18/2018 2:57 PM

## 2018-05-18 NOTE — Patient Instructions (Addendum)
Medication Instructions:  Your physician recommends that you continue on your current medications as directed. Please refer to the Current Medication list given to you today.  If you need a refill on your cardiac medications before your next appointment, please call your pharmacy.   Lab work: You will get lab work today :  BMP and CBC  If you have labs (blood work) drawn today and your tests are completely normal, you will receive your results only by: Marland Kitchen MyChart Message (if you have MyChart) OR . A paper copy in the mail If you have any lab test that is abnormal or we need to change your treatment, we will call you to review the results.  Testing/Procedures: Your physician has recommended that you have an ablation. Catheter ablation is a medical procedure used to treat some cardiac arrhythmias (irregular heartbeats). During catheter ablation, a long, thin, flexible tube is put into a blood vessel in your groin (upper thigh), or neck. This tube is called an ablation catheter. It is then guided to your heart through the blood vessel. Radio frequency waves destroy small areas of heart tissue where abnormal heartbeats may cause an arrhythmia to start. Please see the instruction sheet given to you today.  Follow-Up:  Your physician wants you to follow-up in: 4 weeks after your procedure with Dr. Rayann Heman.    At [facility], you and your health needs are our priority.  As part of our continuing mission to provide you with exceptional heart care, we have created designated Provider Care Teams.  These Care Teams include your primary Cardiologist (physician) and Advanced Practice Providers (APPs -  Physician Assistants and Nurse Practitioners) who all work together to provide you with the care you need, when you need it.   ABLATION instructions:  Please arrive at the [facility] main entrance of [place] hospital at:  11:00 am on June 07, 2018 Do not eat or drink after midnight prior to  procedure Do not take any medications the morning of the procedure DO NOT MISS any doses of your [place] for one night stay You will need someone to drive you home at discharge    Cardiac Ablation Cardiac ablation is a procedure to disable (ablate) a small amount of heart tissue in very specific places. The heart has many electrical connections. Sometimes these connections are abnormal and can cause the heart to beat very fast or irregularly. Ablating some of the problem areas can improve the heart rhythm or return it to normal. Ablation may be done for people who:  Have Wolff-Parkinson-White syndrome.  Have fast heart rhythms (tachycardia).  Have taken medicines for an abnormal heart rhythm (arrhythmia) that were not effective or caused side effects.  Have a high-risk heartbeat that may be life-threatening.  During the procedure, a small incision is made in the neck or the groin, and a long, thin, flexible tube (catheter) is inserted into the incision and moved to the heart. Small devices (electrodes) on the tip of the catheter will send out electrical currents. A type of X-ray (fluoroscopy) will be used to help guide the catheter and to provide images of the heart. Tell a health care provider about:  Any allergies you have.  All medicines you are taking, including vitamins, herbs, eye drops, creams, and over-the-counter medicines.  Any problems you or family members have had with anesthetic medicines.  Any blood disorders you have.  Any surgeries you have had.  Any medical conditions you have, such as kidney  failure.  Whether you are pregnant or may be pregnant. What are the risks? Generally, this is a safe procedure. However, problems may occur, including:  Infection.  Bruising and bleeding at the catheter insertion site.  Bleeding into the chest, especially into the sac that surrounds the heart. This is a serious complication.  Stroke or blood clots.  Damage to  other structures or organs.  Allergic reaction to medicines or dyes.  Need for a permanent pacemaker if the normal electrical system is damaged. A pacemaker is a small computer that sends electrical signals to the heart and helps your heart beat normally.  The procedure not being fully effective. This may not be recognized until months later. Repeat ablation procedures are sometimes required.  What happens before the procedure?  Follow instructions from your health care provider about eating or drinking restrictions.  Ask your health care provider about: ? Changing or stopping your regular medicines. This is especially important if you are taking diabetes medicines or blood thinners. ? Taking medicines such as aspirin and ibuprofen. These medicines can thin your blood. Do not take these medicines before your procedure if your health care provider instructs you not to.  Plan to have someone take you home from the hospital or clinic.  If you will be going home right after the procedure, plan to have someone with you for 24 hours. What happens during the procedure?  To lower your risk of infection: ? Your health care team will wash or sanitize their hands. ? Your skin will be washed with soap. ? Hair may be removed from the incision area.  An IV tube will be inserted into one of your veins.  You will be given a medicine to help you relax (sedative).  The skin on your neck or groin will be numbed.  An incision will be made in your neck or your groin.  A needle will be inserted through the incision and into a large vein in your neck or groin.  A catheter will be inserted into the needle and moved to your heart.  Dye may be injected through the catheter to help your surgeon see the area of the heart that needs treatment.  Electrical currents will be sent from the catheter to ablate heart tissue in desired areas. There are three types of energy that may be used to ablate heart  tissue: ? Heat (radiofrequency energy). ? Laser energy. ? Extreme cold (cryoablation).  When the necessary tissue has been ablated, the catheter will be removed.  Pressure will be held on the catheter insertion area to prevent excessive bleeding.  A bandage (dressing) will be placed over the catheter insertion area. The procedure may vary among health care providers and hospitals. What happens after the procedure?  Your blood pressure, heart rate, breathing rate, and blood oxygen level will be monitored until the medicines you were given have worn off.  Your catheter insertion area will be monitored for bleeding. You will need to lie still for a few hours to ensure that you do not bleed from the catheter insertion area.  Do not drive for 24 hours or as long as directed by your health care provider. Summary  Cardiac ablation is a procedure to disable (ablate) a small amount of heart tissue in very specific places. Ablating some of the problem areas can improve the heart rhythm or return it to normal.  During the procedure, electrical currents will be sent from the catheter to ablate heart tissue in  desired areas. This information is not intended to replace advice given to you by your health care provider. Make sure you discuss any questions you have with your health care provider. Document Released: 11/29/2008 Document Revised: 06/01/2016 Document Reviewed: 06/01/2016 Elsevier Interactive Patient Education  [employer].

## 2018-05-19 LAB — CBC WITH DIFFERENTIAL/PLATELET
BASOS: 1 %
Basophils Absolute: 0.1 10*3/uL (ref 0.0–0.2)
EOS (ABSOLUTE): 0.2 10*3/uL (ref 0.0–0.4)
EOS: 2 %
HEMATOCRIT: 47.7 % (ref 37.5–51.0)
Hemoglobin: 16.3 g/dL (ref 13.0–17.7)
IMMATURE GRANULOCYTES: 1 %
Immature Grans (Abs): 0.1 10*3/uL (ref 0.0–0.1)
Lymphocytes Absolute: 3.3 10*3/uL — ABNORMAL HIGH (ref 0.7–3.1)
Lymphs: 32 %
MCH: 31.1 pg (ref 26.6–33.0)
MCHC: 34.2 g/dL (ref 31.5–35.7)
MCV: 91 fL (ref 79–97)
MONOS ABS: 0.8 10*3/uL (ref 0.1–0.9)
Monocytes: 8 %
NEUTROS PCT: 56 %
Neutrophils Absolute: 5.8 10*3/uL (ref 1.4–7.0)
Platelets: 240 10*3/uL (ref 150–450)
RBC: 5.24 x10E6/uL (ref 4.14–5.80)
RDW: 13.2 % (ref 12.3–15.4)
WBC: 10.1 10*3/uL (ref 3.4–10.8)

## 2018-05-19 LAB — BASIC METABOLIC PANEL
BUN / CREAT RATIO: 19 (ref 10–24)
BUN: 27 mg/dL (ref 8–27)
CO2: 24 mmol/L (ref 20–29)
CREATININE: 1.44 mg/dL — AB (ref 0.76–1.27)
Calcium: 10 mg/dL (ref 8.6–10.2)
Chloride: 98 mmol/L (ref 96–106)
GFR calc Af Amer: 59 mL/min/{1.73_m2} — ABNORMAL LOW (ref >59)
GFR, EST NON AFRICAN AMERICAN: 51 mL/min/{1.73_m2} — AB (ref >59)
Glucose: 98 mg/dL (ref 65–99)
Potassium: 4.1 mmol/L (ref 3.5–5.2)
SODIUM: 143 mmol/L (ref 134–144)

## 2018-05-20 NOTE — Addendum Note (Signed)
Addended by: Rose Phi on: 05/20/2018 09:30 AM   Modules accepted: Orders

## 2018-05-31 ENCOUNTER — Other Ambulatory Visit: Payer: Self-pay | Admitting: Internal Medicine

## 2018-05-31 DIAGNOSIS — M5416 Radiculopathy, lumbar region: Secondary | ICD-10-CM

## 2018-05-31 DIAGNOSIS — G608 Other hereditary and idiopathic neuropathies: Secondary | ICD-10-CM

## 2018-05-31 DIAGNOSIS — M7062 Trochanteric bursitis, left hip: Secondary | ICD-10-CM

## 2018-05-31 DIAGNOSIS — G629 Polyneuropathy, unspecified: Principal | ICD-10-CM

## 2018-05-31 MED ORDER — GABAPENTIN 100 MG PO CAPS: 100.0000 mg | ORAL_CAPSULE | Freq: Every day | ORAL | 1 refills | Status: DC

## 2018-06-04 ENCOUNTER — Other Ambulatory Visit: Payer: Self-pay | Admitting: Internal Medicine

## 2018-06-04 DIAGNOSIS — I10 Essential (primary) hypertension: Secondary | ICD-10-CM

## 2018-06-07 ENCOUNTER — Ambulatory Visit (HOSPITAL_COMMUNITY)
Admission: RE | Admit: 2018-06-07 | Discharge: 2018-06-08 | Disposition: A | Discharge status: Home / Self Care | Payer: Managed Care, Other (non HMO) | Source: Ambulatory Visit | Attending: Internal Medicine | Admitting: Internal Medicine

## 2018-06-07 ENCOUNTER — Other Ambulatory Visit: Payer: Self-pay

## 2018-06-07 ENCOUNTER — Ambulatory Visit (HOSPITAL_COMMUNITY): Payer: Managed Care, Other (non HMO) | Admitting: Certified Registered Nurse Anesthetist

## 2018-06-07 ENCOUNTER — Encounter (HOSPITAL_COMMUNITY)
Admission: RE | Disposition: A | Discharge status: Home / Self Care | Payer: Self-pay | Source: Ambulatory Visit | Attending: Internal Medicine

## 2018-06-07 ENCOUNTER — Encounter (HOSPITAL_COMMUNITY): Payer: Self-pay | Admitting: General Practice

## 2018-06-07 DIAGNOSIS — Z981 Arthrodesis status: Secondary | ICD-10-CM | POA: Diagnosis not present

## 2018-06-07 DIAGNOSIS — Z79899 Other long term (current) drug therapy: Secondary | ICD-10-CM | POA: Insufficient documentation

## 2018-06-07 DIAGNOSIS — I4892 Unspecified atrial flutter: Secondary | ICD-10-CM | POA: Diagnosis present

## 2018-06-07 DIAGNOSIS — G4733 Obstructive sleep apnea (adult) (pediatric): Secondary | ICD-10-CM | POA: Insufficient documentation

## 2018-06-07 DIAGNOSIS — I10 Essential (primary) hypertension: Secondary | ICD-10-CM | POA: Diagnosis not present

## 2018-06-07 DIAGNOSIS — Z7901 Long term (current) use of anticoagulants: Secondary | ICD-10-CM | POA: Insufficient documentation

## 2018-06-07 DIAGNOSIS — Z85828 Personal history of other malignant neoplasm of skin: Secondary | ICD-10-CM | POA: Diagnosis not present

## 2018-06-07 DIAGNOSIS — Z9889 Other specified postprocedural states: Secondary | ICD-10-CM | POA: Diagnosis not present

## 2018-06-07 DIAGNOSIS — Z23 Encounter for immunization: Secondary | ICD-10-CM | POA: Insufficient documentation

## 2018-06-07 HISTORY — PX: ATRIAL FLUTTER ABLATION: SHX5733

## 2018-06-07 HISTORY — DX: Pure hypercholesterolemia, unspecified: E78.00

## 2018-06-07 HISTORY — DX: Obstructive sleep apnea (adult) (pediatric): G47.33

## 2018-06-07 HISTORY — DX: Dependence on other enabling machines and devices: Z99.89

## 2018-06-07 HISTORY — DX: Unspecified osteoarthritis, unspecified site: M19.90

## 2018-06-07 HISTORY — DX: Personal history of other diseases of the musculoskeletal system and connective tissue: Z87.39

## 2018-06-07 HISTORY — PX: A-FLUTTER ABLATION: EP1230

## 2018-06-07 SURGERY — A-FLUTTER ABLATION
Anesthesia: General

## 2018-06-07 MED ORDER — LIDOCAINE HCL (CARDIAC) PF 100 MG/5ML IV SOSY
PREFILLED_SYRINGE | INTRAVENOUS | Status: DC | PRN
  Administered 2018-06-07: 100 mg via INTRATRACHEAL

## 2018-06-07 MED ORDER — AZILSARTAN-CHLORTHALIDONE 40-25 MG PO TABS: 1.0000 | ORAL_TABLET | Freq: Every day | ORAL | Status: DC

## 2018-06-07 MED ORDER — FENTANYL CITRATE (PF) 250 MCG/5ML IJ SOLN
INTRAMUSCULAR | Status: DC | PRN
  Administered 2018-06-07 (×2): 25 ug via INTRAVENOUS
  Administered 2018-06-07: 50 ug via INTRAVENOUS
  Administered 2018-06-07 (×2): 25 ug via INTRAVENOUS
  Administered 2018-06-07: 50 ug via INTRAVENOUS

## 2018-06-07 MED ORDER — RIVAROXABAN 20 MG PO TABS
20.0000 mg | ORAL_TABLET | Freq: Every day | ORAL | Status: DC
  Administered 2018-06-07: 20 mg via ORAL
  Filled 2018-06-07: qty 1

## 2018-06-07 MED ORDER — LOSARTAN POTASSIUM 50 MG PO TABS: 50.0000 mg | ORAL_TABLET | Freq: Every day | ORAL | Status: DC

## 2018-06-07 MED ORDER — SODIUM CHLORIDE 0.9% FLUSH
3.0000 mL | Freq: Two times a day (BID) | INTRAVENOUS | Status: DC
  Administered 2018-06-07: 3 mL via INTRAVENOUS

## 2018-06-07 MED ORDER — SODIUM CHLORIDE 0.9% FLUSH: 3.0000 mL | INTRAVENOUS | Status: DC | PRN

## 2018-06-07 MED ORDER — NEBIVOLOL HCL 5 MG PO TABS
5.0000 mg | ORAL_TABLET | Freq: Every day | ORAL | Status: DC
  Filled 2018-06-07: qty 1

## 2018-06-07 MED ORDER — SODIUM CHLORIDE 0.9 % IV SOLN: 250.0000 mL | INTRAVENOUS | Status: DC | PRN

## 2018-06-07 MED ORDER — PHENYLEPHRINE HCL 10 MG/ML IJ SOLN
INTRAMUSCULAR | Status: DC | PRN
  Administered 2018-06-07 (×2): 80 ug via INTRAVENOUS
  Administered 2018-06-07: 40 ug via INTRAVENOUS
  Administered 2018-06-07: 80 ug via INTRAVENOUS

## 2018-06-07 MED ORDER — SODIUM CHLORIDE 0.9 % IV SOLN
INTRAVENOUS | Status: DC | PRN
  Administered 2018-06-07: 25 ug/min via INTRAVENOUS

## 2018-06-07 MED ORDER — EPHEDRINE SULFATE 50 MG/ML IJ SOLN
INTRAMUSCULAR | Status: DC | PRN
  Administered 2018-06-07 (×2): 5 mg via INTRAVENOUS

## 2018-06-07 MED ORDER — VALSARTAN-HYDROCHLOROTHIAZIDE 320-25 MG PO TABS: 1.0000 | ORAL_TABLET | Freq: Every day | ORAL | Status: DC

## 2018-06-07 MED ORDER — MIDAZOLAM HCL 2 MG/2ML IJ SOLN
INTRAMUSCULAR | Status: DC | PRN
  Administered 2018-06-07 (×2): 2 mg via INTRAVENOUS

## 2018-06-07 MED ORDER — ACETAMINOPHEN 325 MG PO TABS: 650.0000 mg | ORAL_TABLET | ORAL | Status: DC | PRN

## 2018-06-07 MED ORDER — OFF THE BEAT BOOK
Freq: Once | Status: AC
  Administered 2018-06-07: 22:00:00
  Filled 2018-06-07: qty 1

## 2018-06-07 MED ORDER — LEVOCETIRIZINE DIHYDROCHLORIDE 5 MG PO TABS: 5.0000 mg | ORAL_TABLET | Freq: Every evening | ORAL | Status: DC

## 2018-06-07 MED ORDER — GABAPENTIN 100 MG PO CAPS
100.0000 mg | ORAL_CAPSULE | Freq: Every day | ORAL | Status: DC
  Administered 2018-06-07: 22:00:00 100 mg via ORAL
  Filled 2018-06-07: qty 1

## 2018-06-07 MED ORDER — PNEUMOCOCCAL VAC POLYVALENT 25 MCG/0.5ML IJ INJ
0.5000 mL | INJECTION | INTRAMUSCULAR | Status: AC
  Administered 2018-06-08: 09:00:00 0.5 mL via INTRAMUSCULAR
  Filled 2018-06-07: qty 0.5

## 2018-06-07 MED ORDER — HYDROCODONE-ACETAMINOPHEN 5-325 MG PO TABS
1.0000 | ORAL_TABLET | ORAL | Status: DC | PRN
  Administered 2018-06-07 – 2018-06-08 (×3): 1 via ORAL
  Filled 2018-06-07 (×3): qty 1

## 2018-06-07 MED ORDER — SODIUM CHLORIDE 0.9 % IV SOLN
INTRAVENOUS | Status: DC
  Administered 2018-06-07 (×2): via INTRAVENOUS

## 2018-06-07 MED ORDER — CHLORTHALIDONE 25 MG PO TABS
25.0000 mg | ORAL_TABLET | Freq: Every day | ORAL | Status: DC
  Filled 2018-06-07: qty 1

## 2018-06-07 MED ORDER — PROPOFOL 10 MG/ML IV BOLUS
INTRAVENOUS | Status: DC | PRN
  Administered 2018-06-07: 200 mg via INTRAVENOUS
  Administered 2018-06-07 (×2): 30 mg via INTRAVENOUS
  Administered 2018-06-07: 20 mg via INTRAVENOUS

## 2018-06-07 MED ORDER — TRAMADOL HCL 50 MG PO TABS
50.0000 mg | ORAL_TABLET | Freq: Every day | ORAL | Status: DC
  Administered 2018-06-07: 50 mg via ORAL
  Filled 2018-06-07: qty 1

## 2018-06-07 MED ORDER — BUPIVACAINE HCL (PF) 0.25 % IJ SOLN
INTRAMUSCULAR | Status: AC
  Filled 2018-06-07: qty 30

## 2018-06-07 MED ORDER — METHOCARBAMOL 500 MG PO TABS: 500.0000 mg | ORAL_TABLET | Freq: Four times a day (QID) | ORAL | Status: DC | PRN

## 2018-06-07 MED ORDER — ONDANSETRON HCL 4 MG/2ML IJ SOLN: 4.0000 mg | Freq: Four times a day (QID) | INTRAMUSCULAR | Status: DC | PRN

## 2018-06-07 MED ORDER — LORATADINE 10 MG PO TABS
10.0000 mg | ORAL_TABLET | Freq: Every evening | ORAL | Status: DC
  Administered 2018-06-07: 10 mg via ORAL
  Filled 2018-06-07: qty 1

## 2018-06-07 MED ORDER — ONDANSETRON HCL 4 MG/2ML IJ SOLN
INTRAMUSCULAR | Status: DC | PRN
  Administered 2018-06-07: 4 mg via INTRAVENOUS

## 2018-06-07 MED ORDER — BUPIVACAINE HCL (PF) 0.25 % IJ SOLN
INTRAMUSCULAR | Status: DC | PRN
  Administered 2018-06-07: 20 mL

## 2018-06-07 SURGICAL SUPPLY — 11 items
CATH EZ STEER NAV 8MM F-J CUR (ABLATOR) ×3 IMPLANT
CATH WEBSTER BI DIR CS D-F CRV (CATHETERS) ×3 IMPLANT
PACK EP LATEX FREE (CUSTOM PROCEDURE TRAY) ×2
PACK EP LF (CUSTOM PROCEDURE TRAY) ×1 IMPLANT
PAD PRO RADIOLUCENT 2001M-C (PAD) ×3 IMPLANT
PATCH CARTO3 (PAD) ×3 IMPLANT
SHEATH PINNACLE 6F 10CM (SHEATH) ×3 IMPLANT
SHEATH PINNACLE 7F 10CM (SHEATH) ×3 IMPLANT
SHEATH PINNACLE 8F 10CM (SHEATH) ×3 IMPLANT
SHEATH PINNACLE 9F 10CM (SHEATH) ×3 IMPLANT
SHEATH SWARTZ RAMP 8.5F 60CM (SHEATH) ×3 IMPLANT

## 2018-06-07 NOTE — Interval H&P Note (Signed)
History and Physical Interval Note:  06/07/2018 1:54 PM  Henry Figueroa  has presented today for surgery, with the diagnosis of aflutter  The various methods of treatment have been discussed with the patient and family. After consideration of risks, benefits and other options for treatment, the patient has consented to  Procedure(s): A-FLUTTER ABLATION (N/A) as a surgical intervention .  The patient's history has been reviewed, patient examined, no change in status, stable for surgery.  I have reviewed the patient's chart and labs.  Questions were answered to the patient's satisfaction.    He reports compliance with xarelto, without interruption.  Thompson Grayer

## 2018-06-07 NOTE — Anesthesia Preprocedure Evaluation (Signed)
Anesthesia Evaluation  Patient identified by MRN, date of birth, ID band Patient awake    Reviewed: Allergy & Precautions, H&P , NPO status , Patient's Chart, lab work & pertinent test results  Airway Mallampati: II   Neck ROM: full    Dental   Pulmonary sleep apnea , Current Smoker,    breath sounds clear to auscultation       Cardiovascular hypertension, + dysrhythmias Atrial Fibrillation  Rhythm:irregular Rate:Normal     Neuro/Psych  Neuromuscular disease    GI/Hepatic GERD  ,  Endo/Other  obese  Renal/GU      Musculoskeletal   Abdominal   Peds  Hematology   Anesthesia Other Findings   Reproductive/Obstetrics                             Anesthesia Physical Anesthesia Plan  ASA: III  Anesthesia Plan: General   Post-op Pain Management:    Induction: Intravenous  PONV Risk Score and Plan: 1 and Ondansetron, Dexamethasone, Midazolam and Treatment may vary due to age or medical condition  Airway Management Planned: LMA  Additional Equipment:   Intra-op Plan:   Post-operative Plan:   Informed Consent: I have reviewed the patients History and Physical, chart, labs and discussed the procedure including the risks, benefits and alternatives for the proposed anesthesia with the patient or authorized representative who has indicated his/her understanding and acceptance.     Plan Discussed with: CRNA, Anesthesiologist and Surgeon  Anesthesia Plan Comments:         Anesthesia Quick Evaluation

## 2018-06-07 NOTE — Anesthesia Procedure Notes (Signed)
Procedure Name: LMA Insertion Date/Time: 06/07/2018 2:33 PM Performed by: Jearld Pies, CRNA Pre-anesthesia Checklist: Patient identified, Emergency Drugs available, Suction available and Patient being monitored Patient Re-evaluated:Patient Re-evaluated prior to induction Oxygen Delivery Method: Circle System Utilized Preoxygenation: Pre-oxygenation with 100% oxygen Induction Type: IV induction LMA: LMA inserted LMA Size: 4.0 Number of attempts: 1 Airway Equipment and Method: Bite block Placement Confirmation: positive ETCO2 Tube secured with: Tape Dental Injury: Teeth and Oropharynx as per pre-operative assessment

## 2018-06-07 NOTE — Progress Notes (Signed)
Site area: rt groin fv sheaths x2 Site Prior to Removal:  Level 0 Pressure Applied For:  20 minutes Manual:   yes Patient Status During Pull:  stable Post Pull Site:  Level  0 Post Pull Instructions Given:  yes Post Pull Pulses Present: rt dp palpable Dressing Applied:  Gauze and tegaderm Bedrest begins @ 1625 Comments:  IV saline locked

## 2018-06-07 NOTE — Discharge Instructions (Signed)
Post procedure care instructions No driving for 4 days. No lifting over 5 lbs for 1 week. No vigorous or sexual activity for 1 week. You may return to work on 06/14/18. Keep procedure site clean & dry. If you notice increased pain, swelling, bleeding or pus, call/return!  You may shower, but no soaking baths/hot tubs/pools for 1 week.

## 2018-06-07 NOTE — Transfer of Care (Signed)
Immediate Anesthesia Transfer of Care Note  Patient: Henry Figueroa  Procedure(s) Performed: A-FLUTTER ABLATION (N/A )  Patient Location: Cath Lab  Anesthesia Type:General  Level of Consciousness: drowsy, patient cooperative and responds to stimulation  Airway & Oxygen Therapy: Patient Spontanous Breathing and Patient connected to nasal cannula oxygen  Post-op Assessment: Report given to RN and Post -op Vital signs reviewed and stable  Post vital signs: Reviewed and stable  Last Vitals:  Vitals Value Taken Time  BP 103/71 06/07/2018  3:50 PM  Temp 36 C 06/07/2018  3:49 PM  Pulse 89 06/07/2018  3:51 PM  Resp 25 06/07/2018  3:51 PM  SpO2 93 % 06/07/2018  3:51 PM  Vitals shown include unvalidated device data.  Last Pain:  Vitals:   06/07/18 1549  TempSrc: Temporal  PainSc: 0-No pain       Report to [facility] in [place] [facility].   Complications: No apparent anesthesia complications

## 2018-06-08 ENCOUNTER — Encounter (HOSPITAL_COMMUNITY): Payer: Self-pay | Admitting: Internal Medicine

## 2018-06-08 DIAGNOSIS — I483 Typical atrial flutter: Secondary | ICD-10-CM | POA: Diagnosis not present

## 2018-06-08 DIAGNOSIS — I4892 Unspecified atrial flutter: Secondary | ICD-10-CM | POA: Diagnosis not present

## 2018-06-08 NOTE — Discharge Summary (Signed)
ELECTROPHYSIOLOGY PROCEDURE DISCHARGE SUMMARY    Patient ID: Henry Figueroa,  MRN: [id number], DOB/AGE: 03-Jan-1956 62 y.o.  Admit date: 06/07/2018 Discharge date: 06/08/2018  Primary Care Physician: Janith Lima, MD  Primary Cardiologist: Dr. Marlou Porch Electrophysiologist: Dr. Rayann Heman  Primary Discharge Diagnosis:  1. Atrial flutter status post ablation this admission     CHA2DS2Vasc is 2, on xarelto, appropriately dosed  Secondary Discharge Diagnosis:  1. OSA 2. HTN  No Known Allergies   Procedures This Admission: 1.  Electrophysiology study and radiofrequency catheter ablation on 06/07/18 by Dr Rayann Heman.      Brief HPI: Henry Figueroa is a 62 y.o. male with a past medical history as outlined above.  He has documented atrial flutter, has been appropriately anticoagulated.  Risks, benefits, and alternatives to ablation were reviewed with the patient who wished to proceed.   Hospital Course:  The patient was admitted and underwent EPS/RFCA with details as outlined above. The patient was monitored on telemetry overnight which demonstrated sinus rhythm.  Groin/procedure site is without complication.  The patient feels well this morning, no CP or SOB, no site discomfort.  He was examined by Dr Rayann Heman who considered the patient stable for discharge to home.  Follow up will be arranged in 4 weeks.  Wound care and restrictions were reviewed with the patient prior to discharge.   Physical Exam: Vitals:   06/07/18 1800 06/07/18 1830 06/07/18 1945 06/08/18 0614  BP: 122/67 117/75 100/65 118/81  Pulse: 93 95 91 80  Resp: (!) 23 20 20 19   Temp:   98 F (36.7 C) 97.8 F (36.6 C)  TempSrc:   Oral Oral  SpO2: 95% 94% 98% 97%  Weight:    114 kg  Height:        GEN- The patient is well appearing, alert and oriented x 3 today.   HEENT: normocephalic, atraumatic; sclera clear, conjunctiva pink; hearing intact; oropharynx clear; neck supple, no JVP Lymph- no cervical  lymphadenopathy Lungs-   CTA b/l, normal work of breathing.  No wheezes, rales, rhonchi Heart-  RRR, no murmurs, rubs or gallops, PMI not laterally displaced GI- soft, non-tender, non-distended Extremities- no clubbing, cyanosis, or edema; R DP/PT pulses 2+ bilaterally,  R groin without hematoma/bruit MS- no significant deformity or atrophy Skin- warm and dry, no rash or lesion Psych- euthymic mood, full affect Neuro- strength and sensation are intact   Labs:   Lab Results  Component Value Date   WBC 10.1 05/18/2018   HGB 16.3 05/18/2018   HCT 47.7 05/18/2018   MCV 91 05/18/2018   PLT 240 05/18/2018   No results for input(s): NA, K, CL, CO2, BUN, CREATININE, CALCIUM, PROT, BILITOT, ALKPHOS, ALT, AST, GLUCOSE in the last 168 hours.  Invalid input(s): LABALBU  Discharge Medications:  Allergies as of 06/08/2018   No Known Allergies     Medication List    TAKE these medications   acetaminophen 650 MG CR tablet Commonly known as:  TYLENOL Take 1,300 mg by mouth 2 (two) times daily.   Azilsartan-Chlorthalidone 40-25 MG Tabs Take 1 tablet by mouth daily.   Cholecalciferol 1.25 MG (50000 UT) capsule Take 1 capsule (50,000 Units total) by mouth once a week. What changed:  when to take this   gabapentin 100 MG capsule Commonly known as:  NEURONTIN Take 1 capsule (100 mg total) by mouth at bedtime. What changed:    how much to take  when to take this   levocetirizine 5 MG  tablet Commonly known as:  XYZAL Take 1 tablet (5 mg total) by mouth every evening.   methocarbamol 500 MG tablet Commonly known as:  ROBAXIN Take 1 tablet (500 mg total) by mouth every 6 (six) hours as needed for muscle spasms. What changed:  when to take this   nebivolol 5 MG tablet Commonly known as:  BYSTOLIC Take 1 tablet (5 mg total) by mouth daily.   rivaroxaban 20 MG Tabs tablet Commonly known as:  XARELTO Take 1 tablet (20 mg total) by mouth daily with supper.   rosuvastatin 10 MG  tablet Commonly known as:  CRESTOR Take 1 tablet (10 mg total) by mouth daily.   traMADol 50 MG tablet Commonly known as:  ULTRAM Take 50 mg by mouth daily.   valsartan-hydrochlorothiazide 320-25 MG tablet Commonly known as:  DIOVAN-HCT TAKE 1 TABLET BY MOUTH EVERY DAY       Disposition: Home   Follow-up Information    Thompson Grayer, MD Follow up.   Specialty:  Cardiology Why:  07/07/18 @ 8:45AM Contact information: [address] [address] [phone]           Duration of Discharge Encounter: Greater than 30 minutes including physician time.  Army Fossa MD 06/08/2018 8:08 AM

## 2018-06-08 NOTE — Anesthesia Postprocedure Evaluation (Signed)
Anesthesia Post Note  Patient: Henry Figueroa  Procedure(s) Performed: A-FLUTTER ABLATION (N/A )     Patient location during evaluation: PACU Anesthesia Type: General Level of consciousness: awake and alert Pain management: pain level controlled Vital Signs Assessment: post-procedure vital signs reviewed and stable Respiratory status: spontaneous breathing, nonlabored ventilation, respiratory function stable and patient connected to nasal cannula oxygen Cardiovascular status: blood pressure returned to baseline and stable Postop Assessment: no apparent nausea or vomiting Anesthetic complications: no    Last Vitals:  Vitals:   06/07/18 1945 06/08/18 0614  BP: 100/65 118/81  Pulse: 91 80  Resp: 20 19  Temp: 36.7 C 36.6 C  SpO2: 98% 97%    Last Pain:  Vitals:   06/08/18 0800  TempSrc:   PainSc: 0-No pain                 Christon Parada S

## 2018-06-09 ENCOUNTER — Telehealth: Payer: Self-pay | Admitting: Internal Medicine

## 2018-06-09 ENCOUNTER — Encounter: Payer: Self-pay | Admitting: Internal Medicine

## 2018-06-09 NOTE — Telephone Encounter (Signed)
Copied from [place] [id number]. Topic: General - Other >> Jun 09, 2018  1:30 PM Oneta Rack wrote: Relation to pt: self  Call back number: [phone] Pharmacy: [id number] - [place], [place]. AT [address] [phone] (Phone) [phone] (Fax)   Reason for call:  Patient is almost out of samples for BIOLISTIC and cant remember the other BP medication, patient requesting refill. Patient informed please allow 48 to 72 hour turn around.

## 2018-06-15 NOTE — Telephone Encounter (Signed)
Patient calling to check the status of this. Please advise. Patient is down to 1 days worth of medications.

## 2018-06-15 NOTE — Telephone Encounter (Signed)
Appears this was never routed to the office originally.  Please advise.

## 2018-06-16 NOTE — Telephone Encounter (Signed)
Call patient and informed. He is going to check his calendar and schedule an appointment when he comes in to pick up the samples.

## 2018-06-16 NOTE — Telephone Encounter (Signed)
Pt is due for a follow up. Can you have him schedule?

## 2018-06-20 ENCOUNTER — Telehealth: Payer: Self-pay | Admitting: *Deleted

## 2018-06-20 DIAGNOSIS — I77810 Thoracic aortic ectasia: Secondary | ICD-10-CM

## 2018-06-20 NOTE — Telephone Encounter (Signed)
-----   Message from Jerline Pain, MD sent at 06/16/2018  8:05 PM EST ----- Regarding: RE: CT of aorta I think he should have CT of aorta. Dx. Dilated ascending aorta. Thanks Candee Furbish, MD  ----- Message ----- From: Shellia Cleverly, RN Sent: 06/15/2018   2:23 PM EST To: Jerline Pain, MD Subject: CT of aorta                                    Do you still want this patient to have a CT of aorta. From 2 D Echo 04/23/2018- Normal LV function; mild LVH; mildly dilated aortic root (3.8 cm)   and ascending aorta (4.5 cm); mild AI; suggest CTA or MRA to   further assess aorta.  It was not ordered when Dr Rayann Heman saw him.  Thanks   Pam

## 2018-06-22 ENCOUNTER — Other Ambulatory Visit: Payer: Self-pay

## 2018-06-22 DIAGNOSIS — I77810 Thoracic aortic ectasia: Secondary | ICD-10-CM

## 2018-06-22 NOTE — Progress Notes (Signed)
Patient having CT needs BMET before test.

## 2018-06-27 ENCOUNTER — Other Ambulatory Visit: Payer: Managed Care, Other (non HMO)

## 2018-06-28 ENCOUNTER — Other Ambulatory Visit: Payer: Managed Care, Other (non HMO)

## 2018-06-28 DIAGNOSIS — I77810 Thoracic aortic ectasia: Secondary | ICD-10-CM

## 2018-06-29 LAB — BASIC METABOLIC PANEL
BUN / CREAT RATIO: 17 (ref 10–24)
BUN: 27 mg/dL (ref 8–27)
CO2: 26 mmol/L (ref 20–29)
CREATININE: 1.56 mg/dL — AB (ref 0.76–1.27)
Calcium: 10.4 mg/dL — ABNORMAL HIGH (ref 8.6–10.2)
Chloride: 96 mmol/L (ref 96–106)
GFR calc Af Amer: 54 mL/min/{1.73_m2} — ABNORMAL LOW (ref >59)
GFR, EST NON AFRICAN AMERICAN: 47 mL/min/{1.73_m2} — AB (ref >59)
Glucose: 173 mg/dL — ABNORMAL HIGH (ref 65–99)
Potassium: 3.8 mmol/L (ref 3.5–5.2)
SODIUM: 141 mmol/L (ref 134–144)

## 2018-07-05 ENCOUNTER — Encounter: Payer: Self-pay | Admitting: Neurology

## 2018-07-05 ENCOUNTER — Other Ambulatory Visit (INDEPENDENT_AMBULATORY_CARE_PROVIDER_SITE_OTHER): Payer: Managed Care, Other (non HMO)

## 2018-07-05 ENCOUNTER — Encounter: Payer: Self-pay | Admitting: Internal Medicine

## 2018-07-05 ENCOUNTER — Ambulatory Visit (INDEPENDENT_AMBULATORY_CARE_PROVIDER_SITE_OTHER): Payer: Managed Care, Other (non HMO) | Admitting: Internal Medicine

## 2018-07-05 ENCOUNTER — Telehealth: Payer: Self-pay | Admitting: Neurology

## 2018-07-05 VITALS — BP 130/80 | HR 95 | Temp 97.8°F | Ht 72.0 in | Wt 256.8 lb

## 2018-07-05 DIAGNOSIS — I10 Essential (primary) hypertension: Secondary | ICD-10-CM

## 2018-07-05 DIAGNOSIS — N183 Chronic kidney disease, stage 3 unspecified: Secondary | ICD-10-CM

## 2018-07-05 DIAGNOSIS — N1831 Chronic kidney disease, stage 3a: Secondary | ICD-10-CM | POA: Insufficient documentation

## 2018-07-05 DIAGNOSIS — R739 Hyperglycemia, unspecified: Secondary | ICD-10-CM | POA: Diagnosis not present

## 2018-07-05 LAB — HEMOGLOBIN A1C: Hgb A1c MFr Bld: 5.2 % (ref 4.6–6.5)

## 2018-07-05 LAB — PHOSPHORUS: Phosphorus: 3.3 mg/dL (ref 2.3–4.6)

## 2018-07-05 LAB — MAGNESIUM: Magnesium: 1.8 mg/dL (ref 1.5–2.5)

## 2018-07-05 MED ORDER — NEBIVOLOL HCL 5 MG PO TABS: 5.0000 mg | ORAL_TABLET | Freq: Every day | ORAL | 0 refills | Status: DC

## 2018-07-05 MED ORDER — AZILSARTAN-CHLORTHALIDONE 40-25 MG PO TABS: 1.0000 | ORAL_TABLET | Freq: Every day | ORAL | 0 refills | Status: DC

## 2018-07-05 NOTE — Patient Instructions (Signed)

## 2018-07-05 NOTE — Telephone Encounter (Signed)
Received a notification from [place] stating that are attempting to reach out to him. Also patient needs an apt for initial cpap follow up. Will reach out on mychart.  " I need your help with a pt of Dr Allie Bossier. His name is Henry Figueroa, dob Apr 07, 2056. We have attempted on several occasions to contact him about his cpap usage. He is not using it enough each night, has major leaks and his AHI has gone up from baseline. We have attempted on several occasions to contact him by phone and email without any success. Is there a way you could reach out to him for me and have him contact us so we can help him get back on track with his CPAP."

## 2018-07-05 NOTE — Progress Notes (Signed)
Subjective:  Patient ID: Henry Figueroa, male    DOB: 29-Aug-1955  Age: 62 y.o. MRN: [id number]  CC: Hypertension   HPI Henry Figueroa presents for a BP check - He is 2 months status post back surgery that was complicated by an episode of atrial fibrillation.  Fortunately, his back pain has resolved.  He is taking a combination of Edarbyclor and nebivolol for blood pressure control.  He denies any recent episodes of headache, CP, DOE, palpitations, edema, or fatigue.  He recently had lab work done elsewhere that revealed an elevated calcium level.  Outpatient Medications Prior to Visit  Medication Sig Dispense Refill  . acetaminophen (TYLENOL) 650 MG CR tablet Take 1,300 mg by mouth 2 (two) times daily.    . Cholecalciferol 50000 units capsule Take 1 capsule (50,000 Units total) by mouth once a week. (Patient taking differently: Take 50,000 Units by mouth every Sunday. ) 12 capsule 1  . gabapentin (NEURONTIN) 100 MG capsule Take 1 capsule (100 mg total) by mouth at bedtime. (Patient taking differently: Take 200 mg by mouth daily. ) 90 capsule 1  . levocetirizine (XYZAL) 5 MG tablet Take 1 tablet (5 mg total) by mouth every evening. 90 tablet 1  . methocarbamol (ROBAXIN) 500 MG tablet Take 1 tablet (500 mg total) by mouth every 6 (six) hours as needed for muscle spasms. (Patient taking differently: Take 500 mg by mouth daily. ) 50 tablet 0  . rivaroxaban (XARELTO) 20 MG TABS tablet Take 1 tablet (20 mg total) by mouth daily with supper. 30 tablet 3  . rosuvastatin (CRESTOR) 10 MG tablet Take 1 tablet (10 mg total) by mouth daily. 90 tablet 1  . traMADol (ULTRAM) 50 MG tablet Take 50 mg by mouth daily.    . Azilsartan-Chlorthalidone (EDARBYCLOR) 40-25 MG TABS Take 1 tablet by mouth daily. 70 tablet 0  . nebivolol (BYSTOLIC) 5 MG tablet Take 1 tablet (5 mg total) by mouth daily. 84 tablet 0  . valsartan-hydrochlorothiazide (DIOVAN-HCT) 320-25 MG tablet TAKE 1 TABLET BY MOUTH EVERY DAY 90 tablet 0    No facility-administered medications prior to visit.     ROS Review of Systems  Constitutional: Negative.  Negative for diaphoresis and fatigue.  HENT: Negative.   Eyes: Negative for visual disturbance.  Respiratory: Positive for apnea. Negative for cough, chest tightness, shortness of breath and wheezing.   Cardiovascular: Negative for chest pain, palpitations and leg swelling.  Gastrointestinal: Negative for abdominal pain, diarrhea, nausea and vomiting.  Genitourinary: Negative.  Negative for difficulty urinating.  Musculoskeletal: Negative.  Negative for back pain, myalgias and neck pain.  Skin: Negative.   Neurological: Negative.  Negative for dizziness, weakness, light-headedness and numbness.  Hematological: Negative for adenopathy. Does not bruise/bleed easily.  Psychiatric/Behavioral: Negative.     Objective:  BP 130/80 (BP Location: Left Arm, Patient Position: Sitting, Cuff Size: Large)   Pulse 95   Temp 97.8 F (36.6 C) (Oral)   Ht 6' (1.829 m)   Wt 256 lb 12 oz (116.5 kg)   SpO2 96%   BMI 34.82 kg/m   BP Readings from Last 3 Encounters:  07/05/18 130/80  06/08/18 119/77  05/18/18 106/70    Wt Readings from Last 3 Encounters:  07/05/18 256 lb 12 oz (116.5 kg)  06/08/18 251 lb 5.2 oz (114 kg)  05/18/18 249 lb 6.4 oz (113.1 kg)    Physical Exam  Constitutional: He is oriented to person, place, and time. No distress.  HENT:  Mouth/Throat: Oropharynx  is clear and moist. No oropharyngeal exudate.  Eyes: Conjunctivae are normal. No scleral icterus.  Neck: Normal range of motion. Neck supple. No JVD present. No thyromegaly present.  Cardiovascular: Normal rate, regular rhythm and normal heart sounds. Exam reveals no gallop and no friction rub.  No murmur heard. Pulmonary/Chest: Effort normal and breath sounds normal. He has no wheezes. He has no rales.  Abdominal: Soft. Bowel sounds are normal. He exhibits no mass. There is no hepatosplenomegaly. There is no  tenderness.  Musculoskeletal: Normal range of motion. He exhibits no edema, tenderness or deformity.  Lymphadenopathy:    He has no cervical adenopathy.  Neurological: He is alert and oriented to person, place, and time.  Skin: Skin is warm and dry. No rash noted. He is not diaphoretic.  Vitals reviewed.   Lab Results  Component Value Date   WBC 10.1 05/18/2018   HGB 16.3 05/18/2018   HCT 47.7 05/18/2018   PLT 240 05/18/2018   GLUCOSE 173 (H) 06/28/2018   CHOL 168 10/29/2017   TRIG 110.0 10/29/2017   HDL 72.00 10/29/2017   LDLDIRECT 89.0 10/19/2016   LDLCALC 74 10/29/2017   ALT 16 10/29/2017   AST 18 10/29/2017   NA 141 06/28/2018   K 3.8 06/28/2018   CL 96 06/28/2018   CREATININE 1.56 (H) 06/28/2018   BUN 27 06/28/2018   CO2 26 06/28/2018   TSH 0.74 10/29/2017   PSA 1.95 10/29/2017   HGBA1C 5.2 07/05/2018    No results found.  Assessment & Plan:   Henry Figueroa was seen today for hypertension.  Diagnoses and all orders for this visit:  Hyperglycemia-his A1c was normal at 5.2%. -     Hemoglobin A1c; Future  Chronic renal disease, stage 3, moderately decreased glomerular filtration rate (GFR) between 30-59 mL/min/1.73 square meter (HCC)-his recent GFR was 47.  I encouraged him to avoid nephrotoxic agents like anti-inflammatories.  We will continue to maintain strict blood pressure control.  Hypercalcemia- This may be related to the thiazide diuretic.  I will recheck his calcium level today and will screen him for hyperparathyroidism.  He is asymptomatic with this so for now will monitor this but if the calcium levels continues to climb then will discontinue the thiazide diuretic. -     PTH, intact and calcium; Future -     Phosphorus; Future -     Magnesium; Future  Essential hypertension, benign- His blood pressure is adequately well controlled on the current regimen. -     Azilsartan-Chlorthalidone (EDARBYCLOR) 40-25 MG TABS; Take 1 tablet by mouth daily. -      nebivolol (BYSTOLIC) 5 MG tablet; Take 1 tablet (5 mg total) by mouth daily.   I have discontinued Henry Figueroa's valsartan-hydrochlorothiazide. I am also having him maintain his Cholecalciferol, methocarbamol, levocetirizine, rosuvastatin, rivaroxaban, traMADol, gabapentin, acetaminophen, Azilsartan-Chlorthalidone, and nebivolol.  Meds ordered this encounter  Medications  . Azilsartan-Chlorthalidone (EDARBYCLOR) 40-25 MG TABS    Sig: Take 1 tablet by mouth daily.    Dispense:  90 tablet    Refill:  0  . nebivolol (BYSTOLIC) 5 MG tablet    Sig: Take 1 tablet (5 mg total) by mouth daily.    Dispense:  90 tablet    Refill:  0     Follow-up: Return in about 4 months (around 11/04/2018).  Henry Calico, MD

## 2018-07-07 ENCOUNTER — Ambulatory Visit: Payer: Managed Care, Other (non HMO) | Admitting: Internal Medicine

## 2018-07-07 ENCOUNTER — Telehealth: Payer: Self-pay

## 2018-07-07 NOTE — Telephone Encounter (Signed)
Notes recorded by Frederik Schmidt, RN on 07/07/2018 at 8:26 AM EST The patient has been notified of the result and verbalized understanding. All questions (if any) were answered. Frederik Schmidt, RN 07/07/2018 8:26 AM

## 2018-07-07 NOTE — Telephone Encounter (Signed)
-----   Message from Jerline Pain, MD sent at 07/07/2018  6:07 AM EST ----- Creat is increased likely a result of Azilsartan-Chlorthalidone combination to treat HTN.  Let's hold this medication for a day prior to CT scan.  Please send results to Dr. Scarlette Calico as well.

## 2018-07-08 LAB — PTH, INTACT AND CALCIUM
Calcium: 10.9 mg/dL — ABNORMAL HIGH (ref 8.6–10.3)
PTH: 23 pg/mL (ref 14–64)

## 2018-07-09 ENCOUNTER — Other Ambulatory Visit: Payer: Self-pay | Admitting: Internal Medicine

## 2018-07-09 ENCOUNTER — Encounter: Payer: Self-pay | Admitting: Internal Medicine

## 2018-07-09 DIAGNOSIS — I10 Essential (primary) hypertension: Secondary | ICD-10-CM

## 2018-07-09 MED ORDER — IRBESARTAN 300 MG PO TABS: 300.0000 mg | ORAL_TABLET | Freq: Every day | ORAL | 1 refills | Status: DC

## 2018-07-12 ENCOUNTER — Ambulatory Visit (INDEPENDENT_AMBULATORY_CARE_PROVIDER_SITE_OTHER)
Admission: RE | Admit: 2018-07-12 | Discharge: 2018-07-12 | Disposition: A | Discharge status: Home / Self Care | Payer: Managed Care, Other (non HMO) | Source: Ambulatory Visit | Attending: Cardiology | Admitting: Cardiology

## 2018-07-12 DIAGNOSIS — I77810 Thoracic aortic ectasia: Secondary | ICD-10-CM

## 2018-07-12 MED ORDER — IOPAMIDOL (ISOVUE-370) INJECTION 76%
100.0000 mL | Freq: Once | INTRAVENOUS | Status: AC | PRN
  Administered 2018-07-12: 100 mL via INTRAVENOUS

## 2018-07-13 ENCOUNTER — Telehealth: Payer: Self-pay

## 2018-07-13 DIAGNOSIS — I77810 Thoracic aortic ectasia: Secondary | ICD-10-CM

## 2018-07-13 NOTE — Telephone Encounter (Signed)
Spoke with the patient, he accepted the referral to cardiothoracic surgery consult. Sending message to [facility]. The patient had no further questions.

## 2018-07-13 NOTE — Telephone Encounter (Signed)
Please see result note.  Refer to cardiothoracic surgery. Candee Furbish, MD

## 2018-07-13 NOTE — Telephone Encounter (Signed)
-----   Message from Jerline Pain, MD sent at 07/13/2018 11:28 AM EST ----- Proximal ascending aorta is 48 mm at the level of the main pulmonary artery. Please refer to cardiothoracic surgery for consultation.  He is not at the dimension for surgical repair as of yet but I would like for him to sit down with them for further discussion and surveillance. Candee Furbish, MD

## 2018-07-13 NOTE — Telephone Encounter (Signed)
Diane with Radiology called to report chest CTA results.  Per report,  "IMPRESSION: 1. 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 Recommend semi-annual imaging followup by CTA or MRA and referral to cardiothoracic surgery if not already obtained."  Will send to Dr. Marlou Porch as Juluis Rainier per notification protocol. Full results report is in Dr. Marlou Porch' results inbox for review.

## 2018-07-28 ENCOUNTER — Telehealth: Payer: Self-pay | Admitting: Internal Medicine

## 2018-07-28 ENCOUNTER — Institutional Professional Consult (permissible substitution) (INDEPENDENT_AMBULATORY_CARE_PROVIDER_SITE_OTHER): Payer: Managed Care, Other (non HMO) | Admitting: Cardiothoracic Surgery

## 2018-07-28 ENCOUNTER — Encounter: Payer: Self-pay | Admitting: Cardiothoracic Surgery

## 2018-07-28 VITALS — BP 184/110 | HR 80 | Resp 20 | Ht 72.0 in | Wt 268.0 lb

## 2018-07-28 DIAGNOSIS — I7121 Aneurysm of the ascending aorta, without rupture: Secondary | ICD-10-CM

## 2018-07-28 DIAGNOSIS — I77819 Aortic ectasia, unspecified site: Secondary | ICD-10-CM

## 2018-07-28 DIAGNOSIS — I712 Thoracic aortic aneurysm, without rupture: Secondary | ICD-10-CM

## 2018-07-28 MED ORDER — LOSARTAN POTASSIUM 100 MG PO TABS: 100.0000 mg | ORAL_TABLET | Freq: Every day | ORAL | 6 refills | Status: DC
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
## 2018-07-28 NOTE — Telephone Encounter (Signed)
Copied from [place] [id number]. Topic: Quick Communication - See Telephone Encounter >> Jul 28, 2018  3:42 PM Vernona Rieger wrote: CRM for notification. See Telephone encounter for: 07/28/18.  Patient states that insurance will not cover nebivolol (BYSTOLIC) 5 MG tablet. He said that the pharmacy has faxed something to the office and patient is just following because he has one pill left. Please Advise.  [id number] - [place], [place] - [address] [place] [id number]

## 2018-07-29 ENCOUNTER — Other Ambulatory Visit: Payer: Self-pay

## 2018-07-29 ENCOUNTER — Encounter (HOSPITAL_COMMUNITY): Payer: Self-pay

## 2018-07-29 ENCOUNTER — Emergency Department (HOSPITAL_COMMUNITY): Payer: Managed Care, Other (non HMO)

## 2018-07-29 ENCOUNTER — Emergency Department (HOSPITAL_COMMUNITY)
Admission: EM | Admit: 2018-07-29 | Discharge: 2018-07-30 | Disposition: A | Discharge status: Home / Self Care | Payer: Managed Care, Other (non HMO) | Attending: Emergency Medicine | Admitting: Emergency Medicine

## 2018-07-29 ENCOUNTER — Other Ambulatory Visit: Payer: Self-pay | Admitting: Internal Medicine

## 2018-07-29 DIAGNOSIS — R0602 Shortness of breath: Secondary | ICD-10-CM | POA: Diagnosis present

## 2018-07-29 DIAGNOSIS — Z85828 Personal history of other malignant neoplasm of skin: Secondary | ICD-10-CM | POA: Diagnosis not present

## 2018-07-29 DIAGNOSIS — I129 Hypertensive chronic kidney disease with stage 1 through stage 4 chronic kidney disease, or unspecified chronic kidney disease: Secondary | ICD-10-CM | POA: Diagnosis not present

## 2018-07-29 DIAGNOSIS — R0600 Dyspnea, unspecified: Secondary | ICD-10-CM | POA: Insufficient documentation

## 2018-07-29 DIAGNOSIS — I10 Essential (primary) hypertension: Secondary | ICD-10-CM

## 2018-07-29 DIAGNOSIS — I483 Typical atrial flutter: Secondary | ICD-10-CM

## 2018-07-29 DIAGNOSIS — I4891 Unspecified atrial fibrillation: Secondary | ICD-10-CM

## 2018-07-29 DIAGNOSIS — N183 Chronic kidney disease, stage 3 (moderate): Secondary | ICD-10-CM | POA: Insufficient documentation

## 2018-07-29 LAB — COMPREHENSIVE METABOLIC PANEL
ALT: 24 U/L (ref 0–44)
AST: 25 U/L (ref 15–41)
Albumin: 4.3 g/dL (ref 3.5–5.0)
Alkaline Phosphatase: 39 U/L (ref 38–126)
Anion gap: 11 (ref 5–15)
BUN: 17 mg/dL (ref 8–23)
CO2: 23 mmol/L (ref 22–32)
Calcium: 9.6 mg/dL (ref 8.9–10.3)
Chloride: 105 mmol/L (ref 98–111)
Creatinine, Ser: 1 mg/dL (ref 0.61–1.24)
GFR calc Af Amer: >60 mL/min (ref >60)
GFR calc non Af Amer: >60 mL/min (ref >60)
Glucose, Bld: 99 mg/dL (ref 70–99)
Potassium: 3.5 mmol/L (ref 3.5–5.1)
Sodium: 139 mmol/L (ref 135–145)
Total Bilirubin: 0.4 mg/dL (ref 0.3–1.2)
Total Protein: 7.2 g/dL (ref 6.5–8.1)

## 2018-07-29 LAB — PROTIME-INR
INR: 1.02
Prothrombin Time: 13.3 seconds (ref 11.4–15.2)

## 2018-07-29 LAB — CBC WITH DIFFERENTIAL/PLATELET
ABS IMMATURE GRANULOCYTES: 0.03 10*3/uL (ref 0.00–0.07)
Basophils Absolute: 0 10*3/uL (ref 0.0–0.1)
Basophils Relative: 0 %
Eosinophils Absolute: 0.1 10*3/uL (ref 0.0–0.5)
Eosinophils Relative: 2 %
HCT: 40.4 % (ref 39.0–52.0)
Hemoglobin: 12.7 g/dL — ABNORMAL LOW (ref 13.0–17.0)
Immature Granulocytes: 0 %
Lymphocytes Relative: 24 %
Lymphs Abs: 1.6 10*3/uL (ref 0.7–4.0)
MCH: 32.6 pg (ref 26.0–34.0)
MCHC: 31.4 g/dL (ref 30.0–36.0)
MCV: 103.6 fL — ABNORMAL HIGH (ref 80.0–100.0)
Monocytes Absolute: 0.5 10*3/uL (ref 0.1–1.0)
Monocytes Relative: 8 %
NEUTROS ABS: 4.4 10*3/uL (ref 1.7–7.7)
Neutrophils Relative %: 66 %
PLATELETS: 186 10*3/uL (ref 150–400)
RBC: 3.9 MIL/uL — ABNORMAL LOW (ref 4.22–5.81)
RDW: 14.2 % (ref 11.5–15.5)
WBC: 6.8 10*3/uL (ref 4.0–10.5)
nRBC: 0 % (ref 0.0–0.2)

## 2018-07-29 LAB — BRAIN NATRIURETIC PEPTIDE: B Natriuretic Peptide: 325.7 pg/mL — ABNORMAL HIGH (ref 0.0–100.0)

## 2018-07-29 LAB — I-STAT TROPONIN, ED
Troponin i, poc: 0.01 ng/mL (ref 0.00–0.08)
Troponin i, poc: 0.02 ng/mL (ref 0.00–0.08)

## 2018-07-29 LAB — APTT: aPTT: 33 seconds (ref 24–36)

## 2018-07-29 MED ORDER — HYDRALAZINE HCL 20 MG/ML IJ SOLN
10.0000 mg | Freq: Once | INTRAMUSCULAR | Status: AC
  Administered 2018-07-29: 10 mg via INTRAVENOUS
  Filled 2018-07-29: qty 1

## 2018-07-29 MED ORDER — CARVEDILOL 3.125 MG PO TABS: 3.1250 mg | ORAL_TABLET | Freq: Two times a day (BID) | ORAL | 0 refills | Status: DC

## 2018-07-29 MED ORDER — IOHEXOL 300 MG/ML  SOLN: 100.0000 mL | Freq: Once | INTRAMUSCULAR | Status: DC | PRN

## 2018-07-29 MED ORDER — IOPAMIDOL (ISOVUE-370) INJECTION 76%
INTRAVENOUS | Status: AC
  Administered 2018-07-29: 100 mL
  Filled 2018-07-29: qty 100
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
## 2018-07-29 NOTE — ED Notes (Signed)
Patient transported to X-ray 

## 2018-07-29 NOTE — ED Notes (Signed)
Patient transported to CT 

## 2018-07-29 NOTE — Telephone Encounter (Signed)
Change to carvedilol RX sent  [place]

## 2018-07-29 NOTE — ED Triage Notes (Signed)
Pt arrives POV for eval of new onset SOB 1 hour PTA. Pt reports he started a new BP med yesterday-losartan- and reported he checked a BP at home and noted his systolic was >129. Pt denies CP, N/V, diaphoresis. Endorses worse w/ exertion. Pt recently dx'd w/ new ascending aortic aneurysm.

## 2018-07-29 NOTE — Telephone Encounter (Signed)
Request to change Bystolic.   Call to pt - no answer and no vm picked to for me to leave a message. I sent pt a mychart message.   Samples are on my desk for Bystolic 5 mg.

## 2018-07-30 MED ORDER — CARVEDILOL 3.125 MG PO TABS
3.1250 mg | ORAL_TABLET | Freq: Once | ORAL | Status: AC
  Administered 2018-07-30: 3.125 mg via ORAL
  Filled 2018-07-30: qty 1

## 2018-07-30 MED ORDER — CARVEDILOL 3.125 MG PO TABS: 3.1250 mg | ORAL_TABLET | Freq: Two times a day (BID) | ORAL | Status: DC

## 2018-07-30 MED ORDER — CARVEDILOL 3.125 MG PO TABS: 3.1250 mg | ORAL_TABLET | Freq: Two times a day (BID) | ORAL | 0 refills | Status: DC

## 2018-07-30 NOTE — ED Notes (Signed)
Pt stable and ambulatory for discharge, states understanding follow up.  

## 2018-08-09 ENCOUNTER — Encounter: Payer: Self-pay | Admitting: Family

## 2018-08-09 ENCOUNTER — Ambulatory Visit (INDEPENDENT_AMBULATORY_CARE_PROVIDER_SITE_OTHER): Payer: Managed Care, Other (non HMO) | Admitting: Family

## 2018-08-09 ENCOUNTER — Ambulatory Visit: Payer: Self-pay

## 2018-08-09 VITALS — BP 178/110 | HR 83 | Temp 98.2°F | Ht 72.0 in | Wt 264.0 lb

## 2018-08-09 DIAGNOSIS — I10 Essential (primary) hypertension: Secondary | ICD-10-CM | POA: Diagnosis not present

## 2018-08-09 MED ORDER — HYDROCHLOROTHIAZIDE 25 MG PO TABS: 25.0000 mg | ORAL_TABLET | Freq: Every day | ORAL | 0 refills | Status: DC

## 2018-08-09 NOTE — Telephone Encounter (Signed)
Pt called stating that his BP this AM was 195/140 He states that he is out and can not check the BP again for me.  He states he has been seen by cardiology and medications for hypertension have recent ly been changed because of availability.  He is having swelling in his ankles.  He states he was taking a diuretic but when his medications were changed he was taken off. Pt denies all neurologic symptoms. He states he has taken all his medications for the morning. Appointment scheduled per protocol.  Care advice read to patient.  Pt verbalized understanding of all instructions. Reason for Disposition . [1] Systolic BP  >= 610 OR Diastolic >= 960  AND [4] having NO cardiac or neurologic symptoms  Answer Assessment - Initial Assessment Questions 1. BLOOD PRESSURE: "What is the blood pressure?" "Did you take at least two measurements 5 minutes apart?"     195/140  2. ONSET: "When did you take your blood pressure?"     Past couple weeks switch medication 3. HOW: "How did you obtain the blood pressure?" (e.g., visiting nurse, automatic home BP monitor)     Home Bp 4. HISTORY: "Do you have a history of high blood pressure?"     yes 5. MEDICATIONS: "Are you taking any medications for blood pressure?" "Have you missed any doses recently?"     Losartan coreg no missed doses 6. OTHER SYMPTOMS: "Do you have any symptoms?" (e.g., headache, chest pain, blurred vision, difficulty breathing, weakness)     No swollen ankles 7. PREGNANCY: "Is there any chance you are pregnant?" "When was your last menstrual period?"     N/A  Protocols used: HIGH BLOOD PRESSURE-A-AH

## 2018-08-09 NOTE — Patient Instructions (Signed)
Please start the HCTZ today; take a second Losartan tonight and your Carvedilol;  Tomorrow;  In the am: take Losartan, HCTZ, Carvedilol; In the pm; take the Carvedilol;   Call back with your response by Friday please;
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
## 2018-08-12 ENCOUNTER — Ambulatory Visit (INDEPENDENT_AMBULATORY_CARE_PROVIDER_SITE_OTHER): Payer: Managed Care, Other (non HMO) | Admitting: Internal Medicine

## 2018-08-12 ENCOUNTER — Encounter: Payer: Self-pay | Admitting: Internal Medicine

## 2018-08-12 ENCOUNTER — Encounter

## 2018-08-12 VITALS — BP 126/84 | HR 92 | Ht 72.0 in | Wt 255.4 lb

## 2018-08-12 DIAGNOSIS — G4733 Obstructive sleep apnea (adult) (pediatric): Secondary | ICD-10-CM | POA: Diagnosis not present

## 2018-08-12 DIAGNOSIS — I483 Typical atrial flutter: Secondary | ICD-10-CM | POA: Diagnosis not present

## 2018-08-12 DIAGNOSIS — I10 Essential (primary) hypertension: Secondary | ICD-10-CM

## 2018-08-12 NOTE — Patient Instructions (Addendum)
Medication Instructions:  Continue your current medications.  Labwork: None ordered.  Testing/Procedures: None ordered.  Follow-Up: Your physician wants you to follow-up in: as needed with Dr. Rayann Heman.       Any Other Special Instructions Will Be Listed Below (If Applicable).  If you need a refill on your cardiac medications before your next appointment, please call your pharmacy.
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
## 2018-08-15 NOTE — Addendum Note (Signed)
Addended by: Theodoro Koval H on: 08/15/2018 01:21 PM   Modules accepted: Orders  

## 2018-08-30 ENCOUNTER — Ambulatory Visit: Payer: Managed Care, Other (non HMO) | Admitting: Internal Medicine

## 2018-09-08 ENCOUNTER — Ambulatory Visit (INDEPENDENT_AMBULATORY_CARE_PROVIDER_SITE_OTHER): Payer: Managed Care, Other (non HMO) | Admitting: Internal Medicine

## 2018-09-08 ENCOUNTER — Other Ambulatory Visit (INDEPENDENT_AMBULATORY_CARE_PROVIDER_SITE_OTHER): Payer: Managed Care, Other (non HMO)

## 2018-09-08 ENCOUNTER — Encounter: Payer: Self-pay | Admitting: Internal Medicine

## 2018-09-08 VITALS — BP 136/82 | HR 90 | Temp 98.1°F | Ht 72.0 in | Wt 254.8 lb

## 2018-09-08 DIAGNOSIS — I10 Essential (primary) hypertension: Secondary | ICD-10-CM

## 2018-09-08 DIAGNOSIS — N183 Chronic kidney disease, stage 3 unspecified: Secondary | ICD-10-CM

## 2018-09-08 LAB — BASIC METABOLIC PANEL
BUN: 20 mg/dL (ref 6–23)
CO2: 30 mEq/L (ref 19–32)
CREATININE: 1.15 mg/dL (ref 0.40–1.50)
Calcium: 9.8 mg/dL (ref 8.4–10.5)
Chloride: 96 mEq/L (ref 96–112)
GFR: 64.04 mL/min (ref >60.00)
Glucose, Bld: 120 mg/dL — ABNORMAL HIGH (ref 70–99)
Potassium: 4 mEq/L (ref 3.5–5.1)
Sodium: 136 mEq/L (ref 135–145)

## 2018-09-08 NOTE — Progress Notes (Signed)
Subjective:  Patient ID: Henry Figueroa, male    DOB: [birth year]  Age: 63 y.o. MRN: [id number]  CC: Hypertension   HPI Henry Figueroa presents for a BP check - He saw someone else about a month ago and complained of ankle swelling so he started taking hydrochlorothiazide.  He tells me the swelling has resolved and his blood pressure has been well controlled.  Outpatient Medications Prior to Visit  Medication Sig Dispense Refill  . acetaminophen (TYLENOL) 650 MG CR tablet Take 1,300 mg by mouth 2 (two) times daily.    . carvedilol (COREG) 3.125 MG tablet Take 1 tablet (3.125 mg total) by mouth 2 (two) times daily with a meal. 60 tablet 0  . Cholecalciferol 50000 units capsule Take 1 capsule (50,000 Units total) by mouth once a week. (Patient taking differently: Take 50,000 Units by mouth every Sunday. ) 12 capsule 1  . gabapentin (NEURONTIN) 100 MG capsule Take 1 capsule (100 mg total) by mouth at bedtime. (Patient taking differently: Take 200 mg by mouth daily. ) 90 capsule 1  . hydrochlorothiazide (HYDRODIURIL) 25 MG tablet Take 1 tablet (25 mg total) by mouth daily. 90 tablet 0  . losartan (COZAAR) 100 MG tablet Take 1 tablet (100 mg total) by mouth daily. 30 tablet 6  . rosuvastatin (CRESTOR) 10 MG tablet Take 1 tablet (10 mg total) by mouth daily. 90 tablet 1   No facility-administered medications prior to visit.     ROS Review of Systems  Constitutional: Negative for appetite change, diaphoresis, fatigue and unexpected weight change.  HENT: Negative.   Eyes: Negative for visual disturbance.  Respiratory: Negative for cough, chest tightness, shortness of breath and wheezing.   Cardiovascular: Negative for chest pain, palpitations and leg swelling.  Gastrointestinal: Negative for abdominal pain, constipation, diarrhea, nausea and vomiting.  Genitourinary: Negative.  Negative for difficulty urinating and dysuria.  Musculoskeletal: Negative.  Negative for arthralgias and myalgias.    Skin: Negative.  Negative for color change and pallor.  Neurological: Negative.  Negative for dizziness, weakness, light-headedness and headaches.  Hematological: Negative for adenopathy. Does not bruise/bleed easily.  Psychiatric/Behavioral: Negative.     Objective:  BP 136/82 (BP Location: Left Arm, Patient Position: Sitting, Cuff Size: Large)   Pulse 90   Temp 98.1 F (36.7 C) (Oral)   Ht 6' (1.829 m)   Wt 254 lb 12 oz (115.6 kg)   SpO2 97%   BMI 34.55 kg/m   BP Readings from Last 3 Encounters:  09/08/18 136/82  08/12/18 126/84  08/09/18 (!) 178/110    Wt Readings from Last 3 Encounters:  09/08/18 254 lb 12 oz (115.6 kg)  08/12/18 255 lb 6.4 oz (115.8 kg)  08/09/18 264 lb 0.6 oz (119.8 kg)    Physical Exam Vitals signs reviewed.  Constitutional:      Appearance: He is not ill-appearing or diaphoretic.  HENT:     Nose: Nose normal. No congestion or rhinorrhea.     Mouth/Throat:     Mouth: Mucous membranes are moist.     Pharynx: Oropharynx is clear. No oropharyngeal exudate or posterior oropharyngeal erythema.  Eyes:     Conjunctiva/sclera: Conjunctivae normal.  Neck:     Musculoskeletal: Normal range of motion and neck supple. No muscular tenderness.  Cardiovascular:     Rate and Rhythm: Normal rate and regular rhythm.     Heart sounds: No murmur. No gallop.   Pulmonary:     Effort: Pulmonary effort is normal. No respiratory distress.  Breath sounds: No stridor. No wheezing, rhonchi or rales.  Abdominal:     General: Bowel sounds are normal.     Palpations: There is no mass.     Tenderness: There is no abdominal tenderness. There is no guarding.  Musculoskeletal: Normal range of motion.        General: No swelling.     Right lower leg: No edema.     Left lower leg: No edema.  Lymphadenopathy:     Cervical: No cervical adenopathy.  Skin:    General: Skin is warm and dry.     Coloration: Skin is not pale.     Findings: No erythema or rash.   Neurological:     General: No focal deficit present.     Mental Status: He is oriented to person, place, and time. Mental status is at baseline.     Lab Results  Component Value Date   WBC 6.8 07/29/2018   HGB 12.7 (L) 07/29/2018   HCT 40.4 07/29/2018   PLT 186 07/29/2018   GLUCOSE 120 (H) 09/08/2018   CHOL 168 10/29/2017   TRIG 110.0 10/29/2017   HDL 72.00 10/29/2017   LDLDIRECT 89.0 10/19/2016   LDLCALC 74 10/29/2017   ALT 24 07/29/2018   AST 25 07/29/2018   NA 136 09/08/2018   K 4.0 09/08/2018   CL 96 09/08/2018   CREATININE 1.15 09/08/2018   BUN 20 09/08/2018   CO2 30 09/08/2018   TSH 0.74 10/29/2017   PSA 1.95 10/29/2017   INR 1.02 07/29/2018   HGBA1C 5.2 07/05/2018    Dg Chest 2 View  Result Date: 07/29/2018 CLINICAL DATA:  Acute onset of shortness of breath. High blood pressure. EXAM: CHEST - 2 VIEW COMPARISON:  CTA of the chest performed 07/12/2018 FINDINGS: The lungs are well-aerated. Mild vascular congestion is noted. Mild bibasilar opacities likely reflect atelectasis. There is no evidence of pleural effusion or pneumothorax. The heart is mildly enlarged. No acute osseous abnormalities are seen. IMPRESSION: Mild vascular congestion and mild cardiomegaly. Mild bibasilar opacities likely reflect atelectasis. Electronically Signed   By: Garald Balding M.D.   On: 07/29/2018 21:36   Ct Angio Chest Pe W And/or Wo Contrast  Result Date: 07/29/2018 CLINICAL DATA:  Acute onset of shortness of breath. High blood pressure. EXAM: CT ANGIOGRAPHY CHEST WITH CONTRAST TECHNIQUE: Multidetector CT imaging of the chest was performed using the standard protocol during bolus administration of intravenous contrast. Multiplanar CT image reconstructions and MIPs were obtained to evaluate the vascular anatomy. CONTRAST:  83 mL ISOVUE-370 IOPAMIDOL (ISOVUE-370) INJECTION 76% COMPARISON:  Chest radiograph performed earlier today at 9:13 p.m. FINDINGS: Cardiovascular: There is no evidence of  significant pulmonary embolus. Evaluation for pulmonary embolus is mildly suboptimal due to motion artifact. Diffuse coronary artery calcifications are seen. The heart is normal in size. The thoracic aorta is grossly unremarkable. The great vessels are within normal limits. Mediastinum/Nodes: The mediastinum is otherwise unremarkable. No mediastinal lymphadenopathy is seen. No pericardial effusion is identified. The visualized portions of the thyroid gland are unremarkable. No axillary lymphadenopathy is appreciated. Lungs/Pleura: The lungs are clear bilaterally. No focal consolidation, pleural effusion or pneumothorax is seen. No masses are identified. Upper Abdomen: The visualized portions of the liver and spleen are unremarkable. Large stones are seen filling the gallbladder. The visualized portions of the pancreas and adrenal glands are within normal limits. Musculoskeletal: No acute osseous abnormalities are identified. Chronic healing left lower posterior rib fractures are noted. The visualized musculature is unremarkable  in appearance. Review of the MIP images confirms the above findings. IMPRESSION: 1. No evidence of significant pulmonary embolus. 2. Lungs clear bilaterally. 3. Diffuse coronary artery calcifications seen. 4. Cholelithiasis. Gallbladder otherwise unremarkable. 5. Chronic healing left lower posterior rib fractures noted. Electronically Signed   By: Garald Balding M.D.   On: 07/29/2018 22:55    Assessment & Plan:   Henry Figueroa was seen today for hypertension.  Diagnoses and all orders for this visit:  Essential hypertension, benign- His blood pressure is well controlled and his electrolytes and renal function are normal on the current combination of hydrochlorothiazide, losartan, and carvedilol.  Will continue these at their current doses. -     Basic metabolic panel; Future  Chronic renal disease, stage 3, moderately decreased glomerular filtration rate (GFR) between 30-59 mL/min/1.73  square meter ([place])- His renal function is normal now. -     Basic metabolic panel; Future  Hypercalcemia- His calcium level is normal and screening for hyperparathyroidism is negative. -     Basic metabolic panel; Future -     PTH, intact and calcium; Future   I am having Henry Figueroa maintain his Cholecalciferol, rosuvastatin, gabapentin, acetaminophen, losartan, carvedilol, and hydrochlorothiazide.  No orders of the defined types were placed in this encounter.    Follow-up: No follow-ups on file.  Scarlette Calico, MD

## 2018-09-09 ENCOUNTER — Encounter: Payer: Self-pay | Admitting: Internal Medicine

## 2018-09-09 LAB — PTH, INTACT AND CALCIUM
Calcium: 10.1 mg/dL (ref 8.6–10.3)
PTH: 37 pg/mL (ref 14–64)

## 2018-09-11 ENCOUNTER — Encounter: Payer: Self-pay | Admitting: Internal Medicine

## 2018-09-11 NOTE — Patient Instructions (Signed)

## 2018-09-15 ENCOUNTER — Other Ambulatory Visit: Payer: Self-pay | Admitting: Internal Medicine

## 2018-09-15 DIAGNOSIS — E559 Vitamin D deficiency, unspecified: Secondary | ICD-10-CM

## 2018-10-03 ENCOUNTER — Telehealth: Payer: Self-pay | Admitting: Internal Medicine

## 2018-10-03 ENCOUNTER — Other Ambulatory Visit: Payer: Self-pay | Admitting: Internal Medicine

## 2018-10-03 DIAGNOSIS — I10 Essential (primary) hypertension: Secondary | ICD-10-CM

## 2018-10-03 MED ORDER — OLMESARTAN MEDOXOMIL 40 MG PO TABS: 40.0000 mg | ORAL_TABLET | Freq: Every day | ORAL | 1 refills | Status: DC

## 2018-10-03 NOTE — Telephone Encounter (Signed)
Change to olmesartan

## 2018-10-03 NOTE — Telephone Encounter (Signed)
Copied from [place] [id number]. Topic: Quick Communication - Rx Refill/Question >> Oct 03, 2018  3:29 PM Sheran Luz wrote: Medication: losartan (COZAAR) 100 MG tablet   Patient was advised by pharmacy to contact office as this medication is on back order. Patient inquired if an alternate medication can be sent in it's place. Please advise.     Preferred Pharmacy (with phone number or street name): [id number] - [place], [place]. [address]  [phone] (Phone) [phone] (Fax)

## 2018-10-03 NOTE — Telephone Encounter (Signed)
Losartan is on back order.   Can an alternative be sent in or I can call around to see who has this medication available?   Please advise

## 2018-10-04 NOTE — Telephone Encounter (Signed)
Left detailed message informing pt of same.  

## 2018-10-05 ENCOUNTER — Other Ambulatory Visit: Payer: Self-pay | Admitting: Internal Medicine

## 2018-10-05 DIAGNOSIS — E785 Hyperlipidemia, unspecified: Secondary | ICD-10-CM

## 2018-10-14 ENCOUNTER — Other Ambulatory Visit: Payer: Self-pay | Admitting: Family

## 2018-10-21 ENCOUNTER — Other Ambulatory Visit: Payer: Self-pay | Admitting: Internal Medicine

## 2018-10-21 DIAGNOSIS — I483 Typical atrial flutter: Secondary | ICD-10-CM

## 2018-10-21 DIAGNOSIS — I10 Essential (primary) hypertension: Secondary | ICD-10-CM

## 2018-10-25 ENCOUNTER — Other Ambulatory Visit: Payer: Self-pay | Admitting: Cardiothoracic Surgery

## 2018-10-25 DIAGNOSIS — I712 Thoracic aortic aneurysm, without rupture, unspecified: Secondary | ICD-10-CM

## 2018-11-10 ENCOUNTER — Other Ambulatory Visit: Payer: Self-pay | Admitting: Internal Medicine

## 2018-11-10 DIAGNOSIS — G608 Other hereditary and idiopathic neuropathies: Secondary | ICD-10-CM

## 2018-11-10 DIAGNOSIS — G629 Polyneuropathy, unspecified: Principal | ICD-10-CM

## 2018-12-08 ENCOUNTER — Other Ambulatory Visit: Payer: Self-pay | Admitting: Internal Medicine

## 2018-12-08 DIAGNOSIS — E559 Vitamin D deficiency, unspecified: Secondary | ICD-10-CM

## 2019-01-03 ENCOUNTER — Ambulatory Visit
Admission: RE | Admit: 2019-01-03 | Discharge: 2019-01-03 | Disposition: A | Discharge status: Home / Self Care | Payer: Managed Care, Other (non HMO) | Source: Ambulatory Visit | Attending: Cardiothoracic Surgery | Admitting: Cardiothoracic Surgery

## 2019-01-03 ENCOUNTER — Other Ambulatory Visit: Payer: Self-pay

## 2019-01-03 DIAGNOSIS — I712 Thoracic aortic aneurysm, without rupture, unspecified: Secondary | ICD-10-CM

## 2019-01-03 MED ORDER — IOPAMIDOL (ISOVUE-370) INJECTION 76%
75.0000 mL | Freq: Once | INTRAVENOUS | Status: AC | PRN
  Administered 2019-01-03: 75 mL via INTRAVENOUS

## 2019-01-04 ENCOUNTER — Ambulatory Visit: Payer: Managed Care, Other (non HMO) | Admitting: Cardiothoracic Surgery

## 2019-01-05 ENCOUNTER — Other Ambulatory Visit: Payer: Self-pay

## 2019-01-06 ENCOUNTER — Encounter: Payer: Self-pay | Admitting: Cardiothoracic Surgery

## 2019-01-06 ENCOUNTER — Ambulatory Visit (INDEPENDENT_AMBULATORY_CARE_PROVIDER_SITE_OTHER): Payer: Managed Care, Other (non HMO) | Admitting: Cardiothoracic Surgery

## 2019-01-06 VITALS — BP 192/137 | HR 90 | Temp 97.7°F | Resp 20 | Ht 72.0 in | Wt 255.0 lb

## 2019-01-06 DIAGNOSIS — I77819 Aortic ectasia, unspecified site: Secondary | ICD-10-CM | POA: Diagnosis not present

## 2019-01-06 DIAGNOSIS — I712 Thoracic aortic aneurysm, without rupture: Secondary | ICD-10-CM

## 2019-01-06 DIAGNOSIS — I7121 Aneurysm of the ascending aorta, without rupture: Secondary | ICD-10-CM
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
## 2019-01-14 ENCOUNTER — Other Ambulatory Visit: Payer: Self-pay | Admitting: Internal Medicine

## 2019-01-14 DIAGNOSIS — I483 Typical atrial flutter: Secondary | ICD-10-CM

## 2019-01-14 DIAGNOSIS — I10 Essential (primary) hypertension: Secondary | ICD-10-CM

## 2019-01-14 MED ORDER — CARVEDILOL 6.25 MG PO TABS: 6.2500 mg | ORAL_TABLET | Freq: Two times a day (BID) | ORAL | 0 refills | Status: DC

## 2019-02-28 ENCOUNTER — Other Ambulatory Visit: Payer: Self-pay | Admitting: Internal Medicine

## 2019-02-28 DIAGNOSIS — E559 Vitamin D deficiency, unspecified: Secondary | ICD-10-CM

## 2019-03-07 ENCOUNTER — Telehealth: Payer: Self-pay | Admitting: Neurology

## 2019-03-07 NOTE — Telephone Encounter (Signed)
"  Patient setup: 05/17/18 Patient returned device: 03/06/19 Patient has not met compliance and reauth from [place] could not be obtained"  Received the above notification that the patient didn't meet compliance and turned the machine back in

## 2019-03-22 ENCOUNTER — Other Ambulatory Visit: Payer: Self-pay | Admitting: Family

## 2019-03-22 MED ORDER — HYDROCHLOROTHIAZIDE 25 MG PO TABS: 25.0000 mg | ORAL_TABLET | Freq: Every day | ORAL | 0 refills | Status: DC

## 2019-03-26 ENCOUNTER — Other Ambulatory Visit: Payer: Self-pay | Admitting: Internal Medicine

## 2019-03-26 DIAGNOSIS — I10 Essential (primary) hypertension: Secondary | ICD-10-CM

## 2019-03-26 DIAGNOSIS — E559 Vitamin D deficiency, unspecified: Secondary | ICD-10-CM

## 2019-04-05 ENCOUNTER — Other Ambulatory Visit: Payer: Self-pay | Admitting: Internal Medicine

## 2019-04-09 ENCOUNTER — Other Ambulatory Visit: Payer: Self-pay | Admitting: Internal Medicine

## 2019-04-09 DIAGNOSIS — I10 Essential (primary) hypertension: Secondary | ICD-10-CM

## 2019-04-19 ENCOUNTER — Other Ambulatory Visit: Payer: Self-pay | Admitting: Internal Medicine

## 2019-04-19 DIAGNOSIS — E559 Vitamin D deficiency, unspecified: Secondary | ICD-10-CM

## 2019-04-19 DIAGNOSIS — I483 Typical atrial flutter: Secondary | ICD-10-CM

## 2019-04-19 DIAGNOSIS — I10 Essential (primary) hypertension: Secondary | ICD-10-CM

## 2019-05-22 ENCOUNTER — Other Ambulatory Visit: Payer: Self-pay | Admitting: Internal Medicine

## 2019-05-22 DIAGNOSIS — I10 Essential (primary) hypertension: Secondary | ICD-10-CM

## 2019-05-30 ENCOUNTER — Encounter: Payer: Self-pay | Admitting: Internal Medicine

## 2019-05-30 ENCOUNTER — Ambulatory Visit (INDEPENDENT_AMBULATORY_CARE_PROVIDER_SITE_OTHER): Payer: Managed Care, Other (non HMO) | Admitting: Internal Medicine

## 2019-05-30 ENCOUNTER — Other Ambulatory Visit: Payer: Self-pay

## 2019-05-30 ENCOUNTER — Other Ambulatory Visit (INDEPENDENT_AMBULATORY_CARE_PROVIDER_SITE_OTHER): Payer: Managed Care, Other (non HMO)

## 2019-05-30 VITALS — BP 160/100 | HR 92 | Temp 98.2°F | Ht 72.0 in | Wt 265.0 lb

## 2019-05-30 DIAGNOSIS — E785 Hyperlipidemia, unspecified: Secondary | ICD-10-CM | POA: Diagnosis not present

## 2019-05-30 DIAGNOSIS — R739 Hyperglycemia, unspecified: Secondary | ICD-10-CM

## 2019-05-30 DIAGNOSIS — Z23 Encounter for immunization: Secondary | ICD-10-CM | POA: Diagnosis not present

## 2019-05-30 DIAGNOSIS — Z125 Encounter for screening for malignant neoplasm of prostate: Secondary | ICD-10-CM | POA: Diagnosis not present

## 2019-05-30 DIAGNOSIS — Z0000 Encounter for general adult medical examination without abnormal findings: Secondary | ICD-10-CM

## 2019-05-30 DIAGNOSIS — I10 Essential (primary) hypertension: Secondary | ICD-10-CM

## 2019-05-30 DIAGNOSIS — E559 Vitamin D deficiency, unspecified: Secondary | ICD-10-CM

## 2019-05-30 DIAGNOSIS — D539 Nutritional anemia, unspecified: Secondary | ICD-10-CM

## 2019-05-30 DIAGNOSIS — Z1211 Encounter for screening for malignant neoplasm of colon: Secondary | ICD-10-CM | POA: Insufficient documentation

## 2019-05-30 LAB — CBC WITH DIFFERENTIAL/PLATELET
Basophils Absolute: 0.1 10*3/uL (ref 0.0–0.1)
Basophils Relative: 0.8 % (ref 0.0–3.0)
Eosinophils Absolute: 0.1 10*3/uL (ref 0.0–0.7)
Eosinophils Relative: 1.9 % (ref 0.0–5.0)
HCT: 45.1 % (ref 39.0–52.0)
Hemoglobin: 15.5 g/dL (ref 13.0–17.0)
Lymphocytes Relative: 28 % (ref 12.0–46.0)
Lymphs Abs: 2 10*3/uL (ref 0.7–4.0)
MCHC: 34.3 g/dL (ref 30.0–36.0)
MCV: 95.5 fl (ref 78.0–100.0)
Monocytes Absolute: 0.6 10*3/uL (ref 0.1–1.0)
Monocytes Relative: 8.4 % (ref 3.0–12.0)
Neutro Abs: 4.3 10*3/uL (ref 1.4–7.7)
Neutrophils Relative %: 60.9 % (ref 43.0–77.0)
Platelets: 224 10*3/uL (ref 150.0–400.0)
RBC: 4.72 Mil/uL (ref 4.22–5.81)
RDW: 13 % (ref 11.5–15.5)
WBC: 7.1 10*3/uL (ref 4.0–10.5)

## 2019-05-30 LAB — URINALYSIS, ROUTINE W REFLEX MICROSCOPIC
Bilirubin Urine: NEGATIVE
Hgb urine dipstick: NEGATIVE
Ketones, ur: NEGATIVE
Leukocytes,Ua: NEGATIVE
Nitrite: NEGATIVE
RBC / HPF: NONE SEEN (ref 0–?)
Specific Gravity, Urine: 1.025 (ref 1.000–1.030)
Total Protein, Urine: 30 — AB
Urine Glucose: NEGATIVE
Urobilinogen, UA: 0.2 (ref 0.0–1.0)
WBC, UA: NONE SEEN (ref 0–?)
pH: 5.5 (ref 5.0–8.0)

## 2019-05-30 LAB — HEPATIC FUNCTION PANEL
ALT: 22 U/L (ref 0–53)
AST: 21 U/L (ref 0–37)
Albumin: 4.8 g/dL (ref 3.5–5.2)
Alkaline Phosphatase: 49 U/L (ref 39–117)
Bilirubin, Direct: 0.1 mg/dL (ref 0.0–0.3)
Total Bilirubin: 0.5 mg/dL (ref 0.2–1.2)
Total Protein: 7.4 g/dL (ref 6.0–8.3)

## 2019-05-30 LAB — BASIC METABOLIC PANEL
BUN: 19 mg/dL (ref 6–23)
CO2: 30 mEq/L (ref 19–32)
Calcium: 9.7 mg/dL (ref 8.4–10.5)
Chloride: 97 mEq/L (ref 96–112)
Creatinine, Ser: 1.05 mg/dL (ref 0.40–1.50)
GFR: 70.97 mL/min (ref >60.00)
Glucose, Bld: 134 mg/dL — ABNORMAL HIGH (ref 70–99)
Potassium: 3.7 mEq/L (ref 3.5–5.1)
Sodium: 138 mEq/L (ref 135–145)

## 2019-05-30 LAB — LIPID PANEL
Cholesterol: 226 mg/dL — ABNORMAL HIGH (ref 0–200)
HDL: 57.4 mg/dL (ref >39.00)
NonHDL: 168.98
Total CHOL/HDL Ratio: 4
Triglycerides: 248 mg/dL — ABNORMAL HIGH (ref 0.0–149.0)
VLDL: 49.6 mg/dL — ABNORMAL HIGH (ref 0.0–40.0)

## 2019-05-30 LAB — LDL CHOLESTEROL, DIRECT: Direct LDL: 111 mg/dL

## 2019-05-30 LAB — FOLATE: Folate: 17.8 ng/mL (ref >5.9)

## 2019-05-30 LAB — TSH: TSH: 1.72 u[IU]/mL (ref 0.35–4.50)

## 2019-05-30 LAB — PSA: PSA: 1.39 ng/mL (ref 0.10–4.00)

## 2019-05-30 LAB — FERRITIN: Ferritin: 316.8 ng/mL (ref 22.0–322.0)

## 2019-05-30 LAB — VITAMIN D 25 HYDROXY (VIT D DEFICIENCY, FRACTURES): VITD: 28.91 ng/mL — ABNORMAL LOW (ref 30.00–100.00)

## 2019-05-30 LAB — HEMOGLOBIN A1C: Hgb A1c MFr Bld: 5.1 % (ref 4.6–6.5)

## 2019-05-30 LAB — IBC PANEL
Iron: 75 ug/dL (ref 42–165)
Saturation Ratios: 17.7 % — ABNORMAL LOW (ref 20.0–50.0)
Transferrin: 302 mg/dL (ref 212.0–360.0)

## 2019-05-30 LAB — VITAMIN B12: Vitamin B-12: 284 pg/mL (ref 211–911)

## 2019-05-30 MED ORDER — VITAMIN D3 1.25 MG (50000 UT) PO CAPS: ORAL_CAPSULE | ORAL | 0 refills | Status: DC

## 2019-05-30 MED ORDER — CARVEDILOL 12.5 MG PO TABS: 12.5000 mg | ORAL_TABLET | Freq: Two times a day (BID) | ORAL | 0 refills | Status: DC

## 2019-05-30 MED ORDER — INDAPAMIDE 2.5 MG PO TABS: 2.5000 mg | ORAL_TABLET | Freq: Every day | ORAL | 0 refills | Status: DC

## 2019-05-30 MED ORDER — ROSUVASTATIN CALCIUM 10 MG PO TABS: 10.0000 mg | ORAL_TABLET | Freq: Every day | ORAL | 1 refills | Status: DC

## 2019-05-30 NOTE — Progress Notes (Signed)
Subjective:  Patient ID: Henry Figueroa, male    DOB: 08-Jan-1956  Age: 63 y.o. MRN: [id number]  CC: Annual Exam, Hypertension, and Anemia   HPI Henry Figueroa presents for a CPX.  He is concerned that his blood pressure is not adequately well controlled.  He tells me he is compliant with the 3 antihypertensives but has not been compliant with lifestyle modifications.  He complains of weight gain.  He denies headache, blurred vision, chest pain, shortness of breath, DOE, palpitations, or edema.  He was mildly anemic on the set of labs done 6 months ago.  Outpatient Medications Prior to Visit  Medication Sig Dispense Refill  . acetaminophen (TYLENOL) 650 MG CR tablet Take 1,300 mg by mouth 2 (two) times daily.    . Cholecalciferol (VITAMIN D3) 1.25 MG (50000 UT) CAPS TAKE 1 CAPSULE BY MOUTH EVERY SUNDAY 4 capsule 0  . olmesartan (BENICAR) 40 MG tablet TAKE 1 TABLET BY MOUTH EVERY DAY 90 tablet 0  . carvedilol (COREG) 6.25 MG tablet TAKE 1 TABLET BY MOUTH TWICE A DAY WITH A MEAL 180 tablet 0  . hydrochlorothiazide (HYDRODIURIL) 25 MG tablet Take 1 tablet (25 mg total) by mouth daily. 90 tablet 0  . rosuvastatin (CRESTOR) 10 MG tablet TAKE 1 TABLET BY MOUTH EVERY DAY 90 tablet 1   No facility-administered medications prior to visit.     ROS Review of Systems  Constitutional: Positive for unexpected weight change. Negative for diaphoresis and fatigue.  HENT: Negative.   Eyes: Negative.   Respiratory: Negative for cough, chest tightness, shortness of breath and wheezing.   Cardiovascular: Negative for chest pain, palpitations and leg swelling.  Gastrointestinal: Negative for abdominal pain, blood in stool, constipation, diarrhea, nausea and vomiting.  Endocrine: Negative.   Genitourinary: Negative.  Negative for difficulty urinating, dysuria, hematuria, scrotal swelling, testicular pain and urgency.  Musculoskeletal: Negative for arthralgias and myalgias.  Skin: Negative.   Neurological:  Negative.  Negative for dizziness, weakness, light-headedness and headaches.  Hematological: Negative for adenopathy. Does not bruise/bleed easily.  Psychiatric/Behavioral: Negative.     Objective:  BP (!) 160/100 (BP Location: Left Arm, Patient Position: Sitting, Cuff Size: Large)   Pulse 92   Temp 98.2 F (36.8 C) (Oral)   Ht 6' (1.829 m)   Wt 265 lb (120.2 kg)   SpO2 98%   BMI 35.94 kg/m   BP Readings from Last 3 Encounters:  05/30/19 (!) 160/100  01/06/19 (!) 192/137  09/08/18 136/82    Wt Readings from Last 3 Encounters:  05/30/19 265 lb (120.2 kg)  01/06/19 255 lb (115.7 kg)  09/08/18 254 lb 12 oz (115.6 kg)    Physical Exam Vitals signs reviewed.  Constitutional:      Appearance: Normal appearance. He is obese. He is not ill-appearing or diaphoretic.  HENT:     Nose: Nose normal.     Mouth/Throat:     Mouth: Mucous membranes are moist.  Eyes:     General: No scleral icterus.    Conjunctiva/sclera: Conjunctivae normal.  Neck:     Musculoskeletal: Neck supple.  Cardiovascular:     Rate and Rhythm: Normal rate.     Heart sounds: No murmur.  Pulmonary:     Effort: Pulmonary effort is normal.     Breath sounds: No stridor. No wheezing, rhonchi or rales.  Abdominal:     General: Abdomen is protuberant. Bowel sounds are normal. There is no distension.     Palpations: Abdomen is soft. There  is no hepatomegaly or splenomegaly.     Tenderness: There is no abdominal tenderness.     Hernia: There is no hernia in the left inguinal area or right inguinal area.  Genitourinary:    Pubic Area: No rash.      Penis: Normal.      Scrotum/Testes: Normal.        Right: Mass or tenderness not present.        Left: Mass or tenderness not present.     Epididymis:     Right: Normal. No mass.     Left: Normal. No mass.     Prostate: Normal. Not enlarged, not tender and no nodules present.     Rectum: Guaiac result negative. Internal hemorrhoid present. No mass, tenderness,  anal fissure or external hemorrhoid. Normal anal tone.  Musculoskeletal: Normal range of motion.     Right lower leg: No edema.     Left lower leg: No edema.  Lymphadenopathy:     Cervical: No cervical adenopathy.     Lower Body: No right inguinal adenopathy. No left inguinal adenopathy.  Skin:    General: Skin is warm and dry.     Coloration: Skin is not pale.  Neurological:     General: No focal deficit present.     Mental Status: He is alert.  Psychiatric:        Mood and Affect: Mood normal.     Lab Results  Component Value Date   WBC 7.1 05/30/2019   HGB 15.5 05/30/2019   HCT 45.1 05/30/2019   PLT 224.0 05/30/2019   GLUCOSE 134 (H) 05/30/2019   CHOL 226 (H) 05/30/2019   TRIG 248.0 (H) 05/30/2019   HDL 57.40 05/30/2019   LDLDIRECT 111.0 05/30/2019   LDLCALC 74 10/29/2017   ALT 22 05/30/2019   AST 21 05/30/2019   NA 138 05/30/2019   K 3.7 05/30/2019   CL 97 05/30/2019   CREATININE 1.05 05/30/2019   BUN 19 05/30/2019   CO2 30 05/30/2019   TSH 1.72 05/30/2019   PSA 1.39 05/30/2019   INR 1.02 07/29/2018   HGBA1C 5.1 05/30/2019    Ct Angio Chest Aorta W/cm &/or Wo/cm  Result Date: 01/03/2019 CLINICAL DATA:  Follow-up thoracic aortic aneurysm EXAM: CT ANGIOGRAPHY CHEST WITH CONTRAST TECHNIQUE: Multidetector CT imaging of the chest was performed using the standard protocol during bolus administration of intravenous contrast. Multiplanar CT image reconstructions and MIPs were obtained to evaluate the vascular anatomy. CONTRAST:  31mL [id number] COMPARISON:  07/29/2018 FINDINGS: Cardiovascular: Thoracic aorta demonstrates a normal branching pattern. Mild atherosclerotic calcifications are seen. There is fusiform dilatation of the ascending aorta to 4.8 cm similar to that seen on the prior exam. Sino-tubular junction measures approximately 39 mm. Normal tapering is noted the thoracic arch and descending thoracic aorta. Scattered coronary calcifications are noted. No large  central pulmonary embolus is seen. Main pulmonary artery is mildly prominent stable from the prior exam. Mediastinum/Nodes: Esophagus as visualized is limits. Thoracic inlet is unremarkable. No hilar or mediastinal adenopathy is noted. Lungs/Pleura: Lungs are well aerated bilaterally. No focal infiltrate or sizable effusion is seen. Upper Abdomen: Diffuse decreased attenuation of the liver is noted consistent with fatty infiltration. Cholelithiasis is seen Musculoskeletal: Old rib fractures are again noted on the left stable from the prior exam. No acute bony abnormality is noted. Degenerative changes of the thoracic spine are noted. Review of the MIP images confirms the above findings. IMPRESSION: Stable fusiform dilatation of the ascending aorta to  4.8 cm. Recommend semi-annual imaging followup by CTA or MRA and referral to cardiothoracic surgery if not already obtained. This recommendation follows 2010 ACCF/AHA/AATS/ACR/ASA/SCA/SCAI/SIR/STS/SVM Guidelines for the Diagnosis and Management of Patients With Thoracic Aortic Disease. Circulation. 2010; [id number]. 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 Fatty liver with evidence of cholelithiasis similar to that seen on prior CT examination. Aortic Atherosclerosis (ICD10-I70.0). Electronically Signed   By: Inez Catalina M.D.   On: 01/03/2019 10:04    Assessment & Plan:   [place] was seen today for annual exam, hypertension and anemia.  Diagnoses and all orders for this visit:  Essential hypertension, benign- His blood pressure is not adequately well controlled.  In addition to lifestyle modifications I have asked him to upgrade to a more potent thiazide diuretic and I will double the dose of carvedilol.  He will stay on the current dose of the ARB.  His labs are negative for secondary causes or endorgan damage. -     Basic metabolic panel; Future -     TSH; Future -     Urinalysis, Routine w reflex microscopic;  Future -     carvedilol (COREG) 12.5 MG tablet; Take 1 tablet (12.5 mg total) by mouth 2 (two) times daily with a meal. -     indapamide (LOZOL) 2.5 MG tablet; Take 1 tablet (2.5 mg total) by mouth daily.  Hyperglycemia- His A1c is normal. -     Basic metabolic panel; Future -     Hemoglobin A1c; Future  Routine general medical examination at a health care facility- Exam completed, labs reviewed, vaccines reviewed and updated, he is referred for colon cancer screening, patient education was given. -     Lipid panel; Future -     PSA; Future  Vitamin D deficiency -     VITAMIN D 25 Hydroxy (Vit-D Deficiency, Fractures); Future  Deficiency anemia- His H&H are normal.  I will screen him for vitamin deficiency. -     CBC with Differential/Platelet; Future -     Vitamin B12; Future -     IBC panel; Future -     Ferritin; Future -     Folate; Future -     Reticulocytes; Future -     Vitamin B1; Future  Hyperlipidemia with target LDL less than 160- He has achieved his LDL goal and is doing well on the statin. -     TSH; Future -     Hepatic function panel; Future  Screen for colon cancer -     Ambulatory referral to Gastroenterology  Need for influenza vaccination -     Flu Vaccine QUAD 36+ mos IM   I have discontinued Lauri Gottlieb's hydrochlorothiazide and carvedilol. I have also changed his rosuvastatin. Additionally, I am having him start on carvedilol and indapamide. Lastly, I am having him maintain his acetaminophen, olmesartan, and Vitamin D3.  Meds ordered this encounter  Medications  . carvedilol (COREG) 12.5 MG tablet    Sig: Take 1 tablet (12.5 mg total) by mouth 2 (two) times daily with a meal.    Dispense:  180 tablet    Refill:  0  . indapamide (LOZOL) 2.5 MG tablet    Sig: Take 1 tablet (2.5 mg total) by mouth daily.    Dispense:  90 tablet    Refill:  0  . rosuvastatin (CRESTOR) 10 MG tablet    Sig: Take 1 tablet (10 mg total) by mouth daily.  Dispense:  90  tablet    Refill:  1     Follow-up: Return in about 6 weeks (around 07/11/2019).  Scarlette Calico, MD

## 2019-05-30 NOTE — Addendum Note (Signed)
Addended by: Janith Lima on: 05/30/2019 01:04 PM   Modules accepted: Orders

## 2019-05-30 NOTE — Patient Instructions (Signed)

## 2019-05-31 ENCOUNTER — Encounter: Payer: Self-pay | Admitting: Gastroenterology

## 2019-06-02 LAB — RETICULOCYTES
ABS Retic: 115680 cells/uL — ABNORMAL HIGH (ref 25000–9000)
Retic Ct Pct: 2.4 %

## 2019-06-02 LAB — VITAMIN B1: Vitamin B1 (Thiamine): 10 nmol/L (ref 8–30)

## 2019-06-04 ENCOUNTER — Encounter: Payer: Self-pay | Admitting: Internal Medicine

## 2019-06-08 LAB — HM COLONOSCOPY

## 2019-06-21 ENCOUNTER — Encounter: Payer: Self-pay | Admitting: Gastroenterology

## 2019-06-21 ENCOUNTER — Ambulatory Visit (AMBULATORY_SURGERY_CENTER): Payer: Managed Care, Other (non HMO) | Admitting: *Deleted

## 2019-06-21 ENCOUNTER — Other Ambulatory Visit: Payer: Self-pay

## 2019-06-21 VITALS — Temp 97.5°F | Ht 72.0 in | Wt 256.0 lb

## 2019-06-21 DIAGNOSIS — Z1159 Encounter for screening for other viral diseases: Secondary | ICD-10-CM

## 2019-06-21 DIAGNOSIS — Z1211 Encounter for screening for malignant neoplasm of colon: Secondary | ICD-10-CM

## 2019-06-21 MED ORDER — NA SULFATE-K SULFATE-MG SULF 17.5-3.13-1.6 GM/177ML PO SOLN: ORAL | 0 refills | Status: DC

## 2019-06-21 NOTE — Progress Notes (Signed)
Patient is here in-person for PV. Patient denies any allergies to eggs or soy. Patient denies any problems with anesthesia/sedation. Patient denies any oxygen use at home. Patient denies taking any diet/weight loss medications or blood thinners. Patient is not being treated for MRSA or C-diff. EMMI education assisgned to the patient for the procedure, this was explained and instructions given to patient. COVID-19 screening test is on 12/8 , the pt is aware. Pt is aware that care partner will wait in the car during procedure; if they feel like they will be too hot or cold to wait in the car; they may wait in the 4 th floor lobby. Patient is aware to bring only one care partner. We want them to wear a mask (we do not have any that we can provide them), practice social distancing, and we will check their temperatures when they get here.  I did remind the patient that their care partner needs to stay in the parking lot the entire time and have a cell phone available, we will call them when the pt is ready for discharge. Patient will wear mask into building.    Suprep $15 off coupon given to the patient.

## 2019-06-27 ENCOUNTER — Other Ambulatory Visit: Payer: Self-pay

## 2019-06-27 ENCOUNTER — Ambulatory Visit (INDEPENDENT_AMBULATORY_CARE_PROVIDER_SITE_OTHER): Payer: Managed Care, Other (non HMO) | Admitting: Cardiology

## 2019-06-27 ENCOUNTER — Encounter: Payer: Self-pay | Admitting: Cardiology

## 2019-06-27 VITALS — BP 126/80 | HR 92 | Ht 72.0 in | Wt 260.0 lb

## 2019-06-27 DIAGNOSIS — I483 Typical atrial flutter: Secondary | ICD-10-CM

## 2019-06-27 DIAGNOSIS — G4733 Obstructive sleep apnea (adult) (pediatric): Secondary | ICD-10-CM

## 2019-06-27 DIAGNOSIS — I10 Essential (primary) hypertension: Secondary | ICD-10-CM

## 2019-06-27 DIAGNOSIS — I77810 Thoracic aortic ectasia: Secondary | ICD-10-CM | POA: Diagnosis not present
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
## 2019-06-27 NOTE — Patient Instructions (Signed)
Medication Instructions:  The current medical regimen is effective;  continue present plan and medications.  *If you need a refill on your cardiac medications before your next appointment, please call your pharmacy*  Follow-Up: At [facility], you and your health needs are our priority.  As part of our continuing mission to provide you with exceptional heart care, we have created designated Provider Care Teams.  These Care Teams include your primary Cardiologist (physician) and Advanced Practice Providers (APPs -  Physician Assistants and Nurse Practitioners) who all work together to provide you with the care you need, when you need it.  Your next appointment:   1 year(s)  The format for your next appointment:   In Person  Provider:   You may see Mark Skains, MD or one of the following Advanced Practice Providers on your designated Care Team:    Lori Gerhardt, NP  [name] Ingold, NP  Jill McDaniel, NP   Thank you for choosing [facility]!!     

## 2019-07-04 ENCOUNTER — Other Ambulatory Visit: Payer: Self-pay | Admitting: Gastroenterology

## 2019-07-04 ENCOUNTER — Ambulatory Visit (INDEPENDENT_AMBULATORY_CARE_PROVIDER_SITE_OTHER): Payer: Managed Care, Other (non HMO)

## 2019-07-04 DIAGNOSIS — Z1159 Encounter for screening for other viral diseases: Secondary | ICD-10-CM

## 2019-07-05 LAB — SARS CORONAVIRUS 2 (TAT 6-24 HRS): SARS Coronavirus 2: NEGATIVE

## 2019-07-07 ENCOUNTER — Encounter: Payer: Self-pay | Admitting: Gastroenterology

## 2019-07-07 ENCOUNTER — Other Ambulatory Visit: Payer: Self-pay

## 2019-07-07 ENCOUNTER — Ambulatory Visit (AMBULATORY_SURGERY_CENTER): Payer: Managed Care, Other (non HMO) | Admitting: Gastroenterology

## 2019-07-07 VITALS — BP 140/86 | HR 79 | Temp 98.4°F | Resp 16 | Ht 72.0 in | Wt 256.0 lb

## 2019-07-07 DIAGNOSIS — K5520 Angiodysplasia of colon without hemorrhage: Secondary | ICD-10-CM

## 2019-07-07 DIAGNOSIS — D125 Benign neoplasm of sigmoid colon: Secondary | ICD-10-CM | POA: Diagnosis not present

## 2019-07-07 DIAGNOSIS — D123 Benign neoplasm of transverse colon: Secondary | ICD-10-CM

## 2019-07-07 DIAGNOSIS — D124 Benign neoplasm of descending colon: Secondary | ICD-10-CM | POA: Diagnosis not present

## 2019-07-07 DIAGNOSIS — Z1211 Encounter for screening for malignant neoplasm of colon: Secondary | ICD-10-CM

## 2019-07-07 DIAGNOSIS — K640 First degree hemorrhoids: Secondary | ICD-10-CM

## 2019-07-07 DIAGNOSIS — K5730 Diverticulosis of large intestine without perforation or abscess without bleeding: Secondary | ICD-10-CM

## 2019-07-07 MED ORDER — SODIUM CHLORIDE 0.9 % IV SOLN: 500.0000 mL | Freq: Once | INTRAVENOUS | Status: DC

## 2019-07-07 NOTE — Progress Notes (Signed)
Temp check by:JR Vital check by:KA  The patient states no changes in medical or surgical history since pre-visit screening on 06/21/2019.

## 2019-07-07 NOTE — Op Note (Signed)
[place] Patient Name: Henry Figueroa Procedure Date: 07/07/2019 10:59 AM MRN: [id number] Endoscopist: Gerrit Heck , MD Age: 63 Referring MD:  Date of Birth: Apr 09, 1956 Gender: Male Account #: [id number] Procedure:                Colonoscopy Indications:              Screening for colorectal malignant neoplasm (last                            colonoscopy was more than 10 years ago) Medicines:                Monitored Anesthesia Care Procedure:                Pre-Anesthesia Assessment:                           - Prior to the procedure, a History and Physical                            was performed, and patient medications and                            allergies were reviewed. The patient's tolerance of                            previous anesthesia was also reviewed. The risks                            and benefits of the procedure and the sedation                            options and risks were discussed with the patient.                            All questions were answered, and informed consent                            was obtained. Prior Anticoagulants: The patient has                            taken no previous anticoagulant or antiplatelet                            agents. ASA Grade Assessment: III - A patient with                            severe systemic disease. After reviewing the risks                            and benefits, the patient was deemed in                            satisfactory condition to undergo the procedure.  After obtaining informed consent, the colonoscope                            was passed under direct vision. Throughout the                            procedure, the patient's blood pressure, pulse, and                            oxygen saturations were monitored continuously. The                            Colonoscope was introduced through the anus and                            advanced to the  the terminal ileum. The colonoscopy                            was performed without difficulty. The patient                            tolerated the procedure well. The quality of the                            bowel preparation was fair. The terminal ileum,                            ileocecal valve, appendiceal orifice, and rectum                            were photographed. Scope In: 11:02:32 AM Scope Out: 11:25:22 AM Scope Withdrawal Time: 0 hours 17 minutes 3 seconds  Total Procedure Duration: 0 hours 22 minutes 50 seconds  Findings:                 The perianal and digital rectal examinations were                            normal.                           Six sessile polyps were found in the sigmoid colon,                            descending colon and transverse colon. The polyps                            were 5 to 9 mm in size. These polyps were removed                            with a cold snare. Resection and retrieval were                            complete. Estimated blood loss was minimal.  A few small-mouthed diverticula were found in the                            sigmoid colon.                           Two small angioectasias without bleeding were found                            in the cecum.                           Non-bleeding internal hemorrhoids were found during                            retroflexion. The hemorrhoids were small.                           A moderate amount of semi-liquid stool and adherent                            debris was found in the sigmoid colon, in the                            ascending colon, transverse colon, and in the                            cecum, interfering with visualization. Lavage of                            the area was performed using copious amounts of                            sterile water, resulting in clearance with fair                            visualization.                            The terminal ileum appeared normal. Complications:            No immediate complications. Estimated Blood Loss:     Estimated blood loss was minimal. Impression:               - Preparation of the colon was fair. Cannot rule                            out the presence of small or flat polyps in these                            areas.                           - Six 5 to 9 mm polyps in the sigmoid colon, in the  descending colon and in the transverse colon,                            removed with a cold snare. Resected and retrieved.                           - Diverticulosis in the sigmoid colon.                           - Two non-bleeding colonic angioectasias.                           - Non-bleeding internal hemorrhoids.                           - Stool in the sigmoid colon, in the ascending                            colon and in the cecum.                           - The examined portion of the ileum was normal. Recommendation:           - Patient has a contact number available for                            emergencies. The signs and symptoms of potential                            delayed complications were discussed with the                            patient. Return to normal activities tomorrow.                            Written discharge instructions were provided to the                            patient.                           - Resume previous diet.                           - Continue present medications.                           - Await pathology results.                           - Repeat colonoscopy in 1-2 years because the bowel                            preparation was suboptimal and for surveillance.                           -  Return to GI office PRN.                           - Use fiber, for example Citrucel, Fibercon, Konsyl                            or Metamucil. Gerrit Heck, MD 07/07/2019 11:37:22 AM

## 2019-07-07 NOTE — Patient Instructions (Signed)
6 polyps removed today Diverticulosis noted and non-bleeding internal hemorrhoids. USE FIBER, FOR EXAMPLE CITRUCEL, FIBERCON, KONSYL OR METAMUCIL.       YOU HAD AN ENDOSCOPIC PROCEDURE TODAY AT [facility]:   Refer to the procedure report that was given to you for any specific questions about what was found during the examination.  If the procedure report does not answer your questions, please call your gastroenterologist to clarify.  If you requested that your care partner not be given the details of your procedure findings, then the procedure report has been included in a sealed envelope for you to review at your convenience later.  YOU SHOULD EXPECT: Some feelings of bloating in the abdomen. Passage of more gas than usual.  Walking can help get rid of the air that was put into your GI tract during the procedure and reduce the bloating. If you had a lower endoscopy (such as a colonoscopy or flexible sigmoidoscopy) you may notice spotting of blood in your stool or on the toilet paper. If you underwent a bowel prep for your procedure, you may not have a normal bowel movement for a few days.  Please Note:  You might notice some irritation and congestion in your nose or some drainage.  This is from the oxygen used during your procedure.  There is no need for concern and it should clear up in a day or so.  SYMPTOMS TO REPORT IMMEDIATELY:   Following lower endoscopy (colonoscopy or flexible sigmoidoscopy):  Excessive amounts of blood in the stool  Significant tenderness or worsening of abdominal pains  Swelling of the abdomen that is new, acute  Fever of 100F or higher   For urgent or emergent issues, a gastroenterologist can be reached at any hour by calling [phone].   DIET:  We do recommend a small meal at first, but then you may proceed to your regular diet.  Drink plenty of fluids but you should avoid alcoholic beverages for 24 hours.  ACTIVITY:  You should plan  to take it easy for the rest of today and you should NOT DRIVE or use heavy machinery until tomorrow (because of the sedation medicines used during the test).    FOLLOW UP: Our staff will call the number listed on your records 48-72 hours following your procedure to check on you and address any questions or concerns that you may have regarding the information given to you following your procedure. If we do not reach you, we will leave a message.  We will attempt to reach you two times.  During this call, we will ask if you have developed any symptoms of COVID 19. If you develop any symptoms (ie: fever, flu-like symptoms, shortness of breath, cough etc.) before then, please call [phone].  If you test positive for Covid 19 in the 2 weeks post procedure, please call and report this information to [name].    If any biopsies were taken you will be contacted by phone or by letter within the next 1-3 weeks.  Please call us at [phone] if you have not heard about the biopsies in 3 weeks.    SIGNATURES/CONFIDENTIALITY: You and/or your care partner have signed paperwork which will be entered into your electronic medical record.  These signatures attest to the fact that that the information above on your After Visit Summary has been reviewed and is understood.  Full responsibility of the confidentiality of this discharge information lies with you and/or your care-partner.

## 2019-07-07 NOTE — Progress Notes (Signed)
Called to room to assist during endoscopic procedure.  Patient ID and intended procedure confirmed with present staff. Received instructions for my participation in the procedure from the performing physician.  

## 2019-07-07 NOTE — Progress Notes (Signed)
Pt tolerated well. VSS. Awake and recovery.

## 2019-07-11 ENCOUNTER — Telehealth: Payer: Self-pay

## 2019-07-11 ENCOUNTER — Telehealth: Payer: Self-pay | Admitting: *Deleted

## 2019-07-11 NOTE — Telephone Encounter (Signed)
  Follow up Call-  Call back number 07/07/2019  Post procedure Call Back phone  # [phone]  Permission to leave phone message Yes  Some recent data might be hidden     Patient questions:  Do you have a fever, pain , or abdominal swelling? No. Pain Score  0 *  Have you tolerated food without any problems? Yes.    Have you been able to return to your normal activities? Yes.    Do you have any questions about your discharge instructions: Diet   No. Medications  No. Follow up visit  No.  Do you have questions or concerns about your Care? No.  Actions: * If pain score is 4 or above: No action needed, pain <4.  1. Have you developed a fever since your procedure? no  2.   Have you had an respiratory symptoms (SOB or cough) since your procedure? no  3.   Have you tested positive for COVID 19 since your procedure no  4.   Have you had any family members/close contacts diagnosed with the COVID 19 since your procedure?  no   If yes to any of these questions please route to Joylene John, RN and Alphonsa Gin, Therapist, sports.

## 2019-07-11 NOTE — Telephone Encounter (Signed)
Left message on f/u call 

## 2019-07-14 ENCOUNTER — Encounter: Payer: Self-pay | Admitting: Gastroenterology

## 2019-07-18 ENCOUNTER — Other Ambulatory Visit: Payer: Self-pay | Admitting: Internal Medicine

## 2019-07-18 DIAGNOSIS — I10 Essential (primary) hypertension: Secondary | ICD-10-CM

## 2019-08-04 ENCOUNTER — Other Ambulatory Visit: Payer: Self-pay | Admitting: Internal Medicine

## 2019-08-04 DIAGNOSIS — I10 Essential (primary) hypertension: Secondary | ICD-10-CM

## 2019-08-15 ENCOUNTER — Other Ambulatory Visit: Payer: Self-pay | Admitting: Internal Medicine

## 2019-08-15 DIAGNOSIS — E559 Vitamin D deficiency, unspecified: Secondary | ICD-10-CM

## 2019-08-19 ENCOUNTER — Other Ambulatory Visit: Payer: Self-pay | Admitting: Internal Medicine

## 2019-08-19 DIAGNOSIS — I10 Essential (primary) hypertension: Secondary | ICD-10-CM

## 2019-09-08 ENCOUNTER — Other Ambulatory Visit: Payer: Self-pay | Admitting: Internal Medicine

## 2019-09-08 DIAGNOSIS — E559 Vitamin D deficiency, unspecified: Secondary | ICD-10-CM

## 2019-09-15 ENCOUNTER — Ambulatory Visit: Payer: Managed Care, Other (non HMO) | Attending: Internal Medicine

## 2019-09-15 DIAGNOSIS — Z23 Encounter for immunization: Secondary | ICD-10-CM | POA: Insufficient documentation

## 2019-09-15 NOTE — Progress Notes (Signed)
   Covid-19 Vaccination Clinic  Name:  [name]    MRN: [id number] DOB: July 01, 1956  09/15/2019  [name] was observed post Covid-19 immunization for 15 minutes without incidence. He was provided with Vaccine Information Sheet and instruction to access the V-Safe system.   [name] was instructed to call 911 with any severe reactions post vaccine: Marland Kitchen Difficulty breathing  . Swelling of your face and throat  . A fast heartbeat  . A bad rash all over your body  . Dizziness and weakness    Immunizations Administered    Name Date Dose VIS Date Route   Pfizer COVID-19 Vaccine 09/15/2019  2:02 PM 0.3 mL 07/07/2019 Intramuscular   Manufacturer: [place]   Lot: [id number]   [place]: [id number]

## 2019-10-09 ENCOUNTER — Encounter: Payer: Self-pay | Admitting: Internal Medicine

## 2019-10-09 ENCOUNTER — Ambulatory Visit: Payer: Self-pay | Admitting: Internal Medicine

## 2019-10-09 ENCOUNTER — Other Ambulatory Visit: Payer: Self-pay

## 2019-10-09 VITALS — BP 136/88 | HR 76 | Temp 98.3°F | Resp 16 | Ht 72.0 in | Wt 256.0 lb

## 2019-10-09 DIAGNOSIS — R739 Hyperglycemia, unspecified: Secondary | ICD-10-CM

## 2019-10-09 DIAGNOSIS — E781 Pure hyperglyceridemia: Secondary | ICD-10-CM

## 2019-10-09 DIAGNOSIS — I10 Essential (primary) hypertension: Secondary | ICD-10-CM

## 2019-10-09 DIAGNOSIS — E559 Vitamin D deficiency, unspecified: Secondary | ICD-10-CM

## 2019-10-09 LAB — BASIC METABOLIC PANEL
BUN: 18 mg/dL (ref 6–23)
CO2: 31 mEq/L (ref 19–32)
Calcium: 10.5 mg/dL (ref 8.4–10.5)
Chloride: 97 mEq/L (ref 96–112)
Creatinine, Ser: 1.05 mg/dL (ref 0.40–1.50)
GFR: 70.89 mL/min (ref >60.00)
Glucose, Bld: 111 mg/dL — ABNORMAL HIGH (ref 70–99)
Potassium: 4.3 mEq/L (ref 3.5–5.1)
Sodium: 139 mEq/L (ref 135–145)

## 2019-10-09 LAB — HEMOGLOBIN A1C: Hgb A1c MFr Bld: 5.1 % (ref 4.6–6.5)

## 2019-10-09 LAB — TRIGLYCERIDES: Triglycerides: 204 mg/dL — ABNORMAL HIGH (ref 0.0–149.0)

## 2019-10-09 LAB — VITAMIN D 25 HYDROXY (VIT D DEFICIENCY, FRACTURES): VITD: 44.17 ng/mL (ref 30.00–100.00)

## 2019-10-09 NOTE — Progress Notes (Signed)
Dr. Ronnald Ramp requested that I do an EKG for HTN. After I entered the patients room, patient stated that he do want to do one today because he as a lapse in Health Coverage. Patient stated that he should be covered by the end of week and wants to come back for an EKG when his coverage starts.

## 2019-10-09 NOTE — Progress Notes (Signed)
Subjective:  Patient ID: Henry Figueroa, male    DOB: 1956/06/19  Age: 64 y.o. MRN: [id number]  CC: Hypertension  This visit occurred during the SARS-CoV-2 public health emergency.  Safety protocols were in place, including screening questions prior to the visit, additional usage of staff PPE, and extensive cleaning of exam room while observing appropriate contact time as indicated for disinfecting solutions.    HPI Chapel Zellar presents for f/up - He tells me his blood pressure has been well controlled.  I recently got a note from a dentist that was concerned that his blood pressure was too too high to undergo a dental cleansing.  He says his blood pressure is normal at home but it spikes when he comes into the doctor's office.  He denies any recent episodes of headache, blurred vision, chest pain, shortness of breath, palpitations, edema, or fatigue.  Outpatient Medications Prior to Visit  Medication Sig Dispense Refill  . acetaminophen (TYLENOL) 650 MG CR tablet Take 1,300 mg by mouth 2 (two) times daily.    . APPLE CIDER VINEGAR PO Take by mouth.    . carvedilol (COREG) 12.5 MG tablet TAKE 1 TABLET BY MOUTH 2 (TWO) TIMES DAILY WITH A MEAL. 180 tablet 1  . Cholecalciferol (VITAMIN D3) 1.25 MG (50000 UT) CAPS TAKE 1 CAPSULE BY MOUTH EVERY SUNDAY 4 capsule 0  . indapamide (LOZOL) 2.5 MG tablet TAKE 1 TABLET BY MOUTH EVERY DAY 90 tablet 0  . olmesartan (BENICAR) 40 MG tablet TAKE 1 TABLET BY MOUTH EVERY DAY 90 tablet 1  . rosuvastatin (CRESTOR) 10 MG tablet Take 1 tablet (10 mg total) by mouth daily. 90 tablet 1   No facility-administered medications prior to visit.    ROS Review of Systems  Constitutional: Negative for diaphoresis, fatigue and unexpected weight change.  HENT: Negative.   Eyes: Negative for visual disturbance.  Respiratory: Negative for cough, chest tightness, shortness of breath and wheezing.   Cardiovascular: Negative for chest pain, palpitations and leg swelling.    Gastrointestinal: Negative for abdominal pain, constipation, diarrhea, nausea and vomiting.  Endocrine: Negative.   Genitourinary: Negative.  Negative for difficulty urinating.  Musculoskeletal: Negative.  Negative for arthralgias and myalgias.  Skin: Negative.  Negative for color change.  Neurological: Negative for dizziness, weakness, light-headedness and headaches.  Hematological: Negative for adenopathy. Does not bruise/bleed easily.  Psychiatric/Behavioral: Negative.     Objective:  BP 136/88 (BP Location: Left Arm, Patient Position: Sitting, Cuff Size: Large)   Pulse 76   Temp 98.3 F (36.8 C) (Oral)   Resp 16   Ht 6' (1.829 m)   Wt 256 lb (116.1 kg)   SpO2 97%   BMI 34.72 kg/m   BP Readings from Last 3 Encounters:  10/09/19 136/88  07/07/19 140/86  06/27/19 126/80    Wt Readings from Last 3 Encounters:  10/09/19 256 lb (116.1 kg)  07/07/19 256 lb (116.1 kg)  06/27/19 260 lb (117.9 kg)    Physical Exam Vitals reviewed.  Constitutional:      Appearance: Normal appearance.  HENT:     Nose: Nose normal.     Mouth/Throat:     Mouth: Mucous membranes are moist.  Eyes:     General: No scleral icterus.    Conjunctiva/sclera: Conjunctivae normal.  Cardiovascular:     Rate and Rhythm: Normal rate and regular rhythm.     Heart sounds: No murmur.     Comments: He refused to do an EKG today citing lack of  insurance coverage. Pulmonary:     Effort: Pulmonary effort is normal.     Breath sounds: No stridor. No wheezing, rhonchi or rales.  Abdominal:     General: Abdomen is protuberant. There is no distension.     Palpations: Abdomen is soft. There is no hepatomegaly, splenomegaly or mass.     Tenderness: There is no abdominal tenderness. There is no guarding.  Musculoskeletal:        General: Normal range of motion.     Cervical back: Neck supple.     Right lower leg: No edema.     Left lower leg: No edema.  Lymphadenopathy:     Cervical: No cervical  adenopathy.  Skin:    General: Skin is warm and dry.     Coloration: Skin is not pale.  Neurological:     General: No focal deficit present.     Mental Status: He is alert.  Psychiatric:        Mood and Affect: Mood normal.        Behavior: Behavior normal.     Lab Results  Component Value Date   WBC 7.1 05/30/2019   HGB 15.5 05/30/2019   HCT 45.1 05/30/2019   PLT 224.0 05/30/2019   GLUCOSE 111 (H) 10/09/2019   CHOL 226 (H) 05/30/2019   TRIG 204.0 (H) 10/09/2019   HDL 57.40 05/30/2019   LDLDIRECT 111.0 05/30/2019   LDLCALC 74 10/29/2017   ALT 22 05/30/2019   AST 21 05/30/2019   NA 139 10/09/2019   K 4.3 10/09/2019   CL 97 10/09/2019   CREATININE 1.05 10/09/2019   BUN 18 10/09/2019   CO2 31 10/09/2019   TSH 1.72 05/30/2019   PSA 1.39 05/30/2019   INR 1.02 07/29/2018   HGBA1C 5.1 10/09/2019    CT ANGIO CHEST AORTA W/CM &/OR WO/CM  Result Date: 01/03/2019 CLINICAL DATA:  Follow-up thoracic aortic aneurysm EXAM: CT ANGIOGRAPHY CHEST WITH CONTRAST TECHNIQUE: Multidetector CT imaging of the chest was performed using the standard protocol during bolus administration of intravenous contrast. Multiplanar CT image reconstructions and MIPs were obtained to evaluate the vascular anatomy. CONTRAST:  52mL [id number] COMPARISON:  07/29/2018 FINDINGS: Cardiovascular: Thoracic aorta demonstrates a normal branching pattern. Mild atherosclerotic calcifications are seen. There is fusiform dilatation of the ascending aorta to 4.8 cm similar to that seen on the prior exam. Sino-tubular junction measures approximately 39 mm. Normal tapering is noted the thoracic arch and descending thoracic aorta. Scattered coronary calcifications are noted. No large central pulmonary embolus is seen. Main pulmonary artery is mildly prominent stable from the prior exam. Mediastinum/Nodes: Esophagus as visualized is limits. Thoracic inlet is unremarkable. No hilar or mediastinal adenopathy is noted. Lungs/Pleura:  Lungs are well aerated bilaterally. No focal infiltrate or sizable effusion is seen. Upper Abdomen: Diffuse decreased attenuation of the liver is noted consistent with fatty infiltration. Cholelithiasis is seen Musculoskeletal: Old rib fractures are again noted on the left stable from the prior exam. No acute bony abnormality is noted. Degenerative changes of the thoracic spine are noted. Review of the MIP images confirms the above findings. 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 Recommend semi-annual imaging followup by CTA or MRA and referral to cardiothoracic surgery if not already obtained. This recommendation follows 2010 ACCF/AHA/AATS/ACR/ASA/SCA/SCAI/SIR/STS/SVM Guidelines for the Diagnosis and Management of Patients With Thoracic Aortic Disease. Circulation. 2010; [id number]. 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 Fatty liver with evidence of cholelithiasis similar to  that seen on prior CT examination. Aortic Atherosclerosis (ICD10-I70.0). Electronically Signed   By: Inez Catalina M.D.   On: 01/03/2019 10:04    Assessment & Plan:   Krystofer was seen today for hypertension.  Diagnoses and all orders for this visit:  Essential hypertension, benign- His blood pressure is adequately well controlled.  Electrolytes and renal function are normal. -     Basic metabolic panel  Hypercalcemia- His calcium level is normal now that the thiazide diuretic dose has been lowered. -     Basic metabolic panel  Hyperglycemia- His A1c is normal. -     Hemoglobin A1c -     Basic metabolic panel  Vitamin D deficiency -     VITAMIN D 25 Hydroxy (Vit-D Deficiency, Fractures)  Primary hypertriglyceridemia- His triglycerides remain mildly elevated.  I recommended that he continue to improve his lifestyle modifications and avoid alcohol. -     Triglycerides   I am having [name] maintain his acetaminophen, rosuvastatin, APPLE  CIDER VINEGAR PO, olmesartan, carvedilol, indapamide, and Vitamin D3.  No orders of the defined types were placed in this encounter.    Follow-up: Return in about 6 months (around 04/10/2020).  Scarlette Calico, MD

## 2019-10-09 NOTE — Patient Instructions (Signed)

## 2019-10-10 ENCOUNTER — Encounter: Payer: Self-pay | Admitting: Internal Medicine

## 2019-10-10 ENCOUNTER — Ambulatory Visit: Payer: Self-pay | Attending: Internal Medicine

## 2019-10-10 DIAGNOSIS — Z23 Encounter for immunization: Secondary | ICD-10-CM

## 2019-10-10 NOTE — Progress Notes (Signed)
   Covid-19 Vaccination Clinic  Name:  Henry Figueroa    MRN: [id number] DOB: 1956/03/16  10/10/2019  [name] was observed post Covid-19 immunization for 15 minutes without incident. He was provided with Vaccine Information Sheet and instruction to access the V-Safe system.   [name] was instructed to call 911 with any severe reactions post vaccine: Marland Kitchen Difficulty breathing  . Swelling of face and throat  . A fast heartbeat  . A bad rash all over body  . Dizziness and weakness   Immunizations Administered    Name Date Dose VIS Date Route   Pfizer COVID-19 Vaccine 10/10/2019  8:26 AM 0.3 mL 07/07/2019 Intramuscular   Manufacturer: [place]   Lot: [id number]   [place]: [id number]

## 2019-10-30 ENCOUNTER — Other Ambulatory Visit: Payer: Self-pay | Admitting: Internal Medicine

## 2019-10-30 DIAGNOSIS — E785 Hyperlipidemia, unspecified: Secondary | ICD-10-CM

## 2019-11-13 ENCOUNTER — Other Ambulatory Visit: Payer: Self-pay | Admitting: Internal Medicine

## 2019-11-13 DIAGNOSIS — I10 Essential (primary) hypertension: Secondary | ICD-10-CM

## 2019-12-14 IMAGING — NM NM MISC PROCEDURE
5 series · 30 of 30 positions shown · non-contrast
Comparison: none

[Series 1: rest · 6.51mm/px · 6 of 64 frames shown]
[frame 6/64]
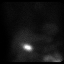
[frame 16/64]
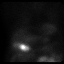
[frame 27/64]
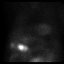
[frame 38/64]
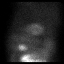
[frame 48/64]
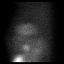
[frame 59/64]
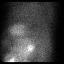

[Series 1: wbr_r-proj_st rest · 6.51mm/px · 6 of 64 frames shown]
[frame 6/64]
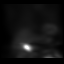
[frame 16/64]
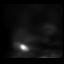
[frame 27/64]
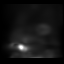
[frame 38/64]
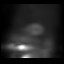
[frame 48/64]
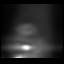
[frame 59/64]
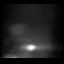

[Series 2: stress - gated · 6.51mm/px · 6 of 512 frames shown]
[frame 43/512]
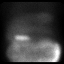
[frame 128/512]
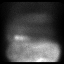
[frame 214/512]
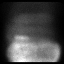
[frame 299/512]
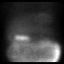
[frame 384/512]
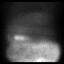
[frame 470/512]
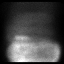

[Series 2: wbr_s-proj_st stress - gated · 6.51mm/px · 6 of 512 frames shown]
[frame 43/512]
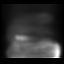
[frame 128/512]
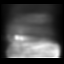
[frame 214/512]
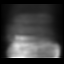
[frame 299/512]
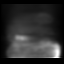
[frame 384/512]
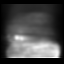
[frame 470/512]
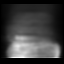

[Series 3: stress - perfusion · 6.51mm/px · 6 of 64 frames shown]
[frame 6/64]
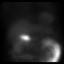
[frame 16/64]
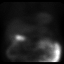
[frame 27/64]
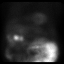
[frame 38/64]
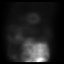
[frame 48/64]
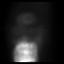
[frame 59/64]
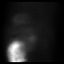

[30 of 30 positions shown; findings below may reference images not displayed]

Canned report from images found in remote index.

Refer to host system for actual result text.

## 2020-01-24 ENCOUNTER — Other Ambulatory Visit: Payer: Self-pay | Admitting: Internal Medicine

## 2020-01-24 DIAGNOSIS — I10 Essential (primary) hypertension: Secondary | ICD-10-CM

## 2020-04-29 ENCOUNTER — Other Ambulatory Visit: Payer: Self-pay | Admitting: Internal Medicine

## 2020-04-29 DIAGNOSIS — I10 Essential (primary) hypertension: Secondary | ICD-10-CM

## 2020-05-20 ENCOUNTER — Ambulatory Visit: Payer: Self-pay | Attending: Internal Medicine

## 2020-05-20 DIAGNOSIS — Z23 Encounter for immunization: Secondary | ICD-10-CM

## 2020-05-20 NOTE — Progress Notes (Signed)
   Covid-19 Vaccination Clinic  Name:  Henry Figueroa    MRN: [id number] DOB: [birth year]  05/20/2020  [name] was observed post Covid-19 immunization for 15 minutes without incident. He was provided with Vaccine Information Sheet and instruction to access the V-Safe system.   [name] was instructed to call 911 with any severe reactions post vaccine: Marland Kitchen Difficulty breathing  . Swelling of face and throat  . A fast heartbeat  . A bad rash all over body  . Dizziness and weakness

## 2020-06-10 ENCOUNTER — Encounter: Payer: Self-pay | Admitting: Gastroenterology

## 2020-06-10 ENCOUNTER — Encounter: Payer: Self-pay | Admitting: Internal Medicine

## 2020-06-10 ENCOUNTER — Other Ambulatory Visit: Payer: Self-pay

## 2020-06-10 ENCOUNTER — Ambulatory Visit (INDEPENDENT_AMBULATORY_CARE_PROVIDER_SITE_OTHER): Payer: Medicare Other | Admitting: Internal Medicine

## 2020-06-10 VITALS — BP 180/102 | HR 87 | Temp 98.4°F | Resp 16 | Ht 72.0 in | Wt 239.2 lb

## 2020-06-10 DIAGNOSIS — R6881 Early satiety: Secondary | ICD-10-CM

## 2020-06-10 DIAGNOSIS — Z0000 Encounter for general adult medical examination without abnormal findings: Secondary | ICD-10-CM | POA: Diagnosis not present

## 2020-06-10 DIAGNOSIS — S0081XA Abrasion of other part of head, initial encounter: Secondary | ICD-10-CM

## 2020-06-10 DIAGNOSIS — N1831 Chronic kidney disease, stage 3a: Secondary | ICD-10-CM

## 2020-06-10 DIAGNOSIS — G608 Other hereditary and idiopathic neuropathies: Secondary | ICD-10-CM

## 2020-06-10 DIAGNOSIS — F322 Major depressive disorder, single episode, severe without psychotic features: Secondary | ICD-10-CM | POA: Insufficient documentation

## 2020-06-10 DIAGNOSIS — E785 Hyperlipidemia, unspecified: Secondary | ICD-10-CM | POA: Diagnosis not present

## 2020-06-10 DIAGNOSIS — I10 Essential (primary) hypertension: Secondary | ICD-10-CM | POA: Diagnosis not present

## 2020-06-10 DIAGNOSIS — Z23 Encounter for immunization: Secondary | ICD-10-CM | POA: Diagnosis not present

## 2020-06-10 DIAGNOSIS — N400 Enlarged prostate without lower urinary tract symptoms: Secondary | ICD-10-CM | POA: Diagnosis not present

## 2020-06-10 DIAGNOSIS — E559 Vitamin D deficiency, unspecified: Secondary | ICD-10-CM

## 2020-06-10 LAB — BASIC METABOLIC PANEL
BUN: 12 mg/dL (ref 6–23)
CO2: 31 mEq/L (ref 19–32)
Calcium: 9.7 mg/dL (ref 8.4–10.5)
Chloride: 100 mEq/L (ref 96–112)
Creatinine, Ser: 0.9 mg/dL (ref 0.40–1.50)
GFR: 89.53 mL/min (ref >60.00)
Glucose, Bld: 99 mg/dL (ref 70–99)
Potassium: 4 mEq/L (ref 3.5–5.1)
Sodium: 140 mEq/L (ref 135–145)

## 2020-06-10 LAB — CBC WITH DIFFERENTIAL/PLATELET
Basophils Absolute: 0 K/uL (ref 0.0–0.1)
Basophils Relative: 0.5 % (ref 0.0–3.0)
Eosinophils Absolute: 0.1 K/uL (ref 0.0–0.7)
Eosinophils Relative: 1.2 % (ref 0.0–5.0)
HCT: 43.8 % (ref 39.0–52.0)
Hemoglobin: 15 g/dL (ref 13.0–17.0)
Lymphocytes Relative: 18.3 % (ref 12.0–46.0)
Lymphs Abs: 1.5 K/uL (ref 0.7–4.0)
MCHC: 34.2 g/dL (ref 30.0–36.0)
MCV: 97.9 fl (ref 78.0–100.0)
Monocytes Absolute: 0.6 K/uL (ref 0.1–1.0)
Monocytes Relative: 6.7 % (ref 3.0–12.0)
Neutro Abs: 6.1 K/uL (ref 1.4–7.7)
Neutrophils Relative %: 73.3 % (ref 43.0–77.0)
Platelets: 219 K/uL (ref 150.0–400.0)
RBC: 4.47 Mil/uL (ref 4.22–5.81)
RDW: 13.4 % (ref 11.5–15.5)
WBC: 8.3 K/uL (ref 4.0–10.5)

## 2020-06-10 LAB — LIPID PANEL
Cholesterol: 122 mg/dL (ref 0–200)
HDL: 72.3 mg/dL (ref >39.00)
LDL Cholesterol: 17 mg/dL (ref 0–99)
NonHDL: 49.49
Total CHOL/HDL Ratio: 2
Triglycerides: 161 mg/dL — ABNORMAL HIGH (ref 0.0–149.0)
VLDL: 32.2 mg/dL (ref 0.0–40.0)

## 2020-06-10 LAB — TSH: TSH: 0.87 u[IU]/mL (ref 0.35–4.50)

## 2020-06-10 LAB — URINALYSIS, ROUTINE W REFLEX MICROSCOPIC
Hgb urine dipstick: NEGATIVE
Ketones, ur: 15 — AB
Leukocytes,Ua: NEGATIVE
Nitrite: NEGATIVE
Specific Gravity, Urine: >=1.03 — AB (ref 1.000–1.030)
Total Protein, Urine: 100 — AB
Urine Glucose: NEGATIVE
Urobilinogen, UA: 1 (ref 0.0–1.0)
pH: 5.5 (ref 5.0–8.0)

## 2020-06-10 LAB — HEPATIC FUNCTION PANEL
ALT: 16 U/L (ref 0–53)
AST: 19 U/L (ref 0–37)
Albumin: 4.7 g/dL (ref 3.5–5.2)
Alkaline Phosphatase: 47 U/L (ref 39–117)
Bilirubin, Direct: 0.2 mg/dL (ref 0.0–0.3)
Total Bilirubin: 1.1 mg/dL (ref 0.2–1.2)
Total Protein: 7.4 g/dL (ref 6.0–8.3)

## 2020-06-10 LAB — VITAMIN D 25 HYDROXY (VIT D DEFICIENCY, FRACTURES): VITD: 45.29 ng/mL (ref 30.00–100.00)

## 2020-06-10 LAB — PSA: PSA: 1.43 ng/mL (ref 0.10–4.00)

## 2020-06-10 MED ORDER — INDAPAMIDE 2.5 MG PO TABS: 2.5000 mg | ORAL_TABLET | Freq: Every day | ORAL | 0 refills | Status: DC

## 2020-06-10 MED ORDER — VORTIOXETINE HBR 5 MG PO TABS: 5.0000 mg | ORAL_TABLET | Freq: Every day | ORAL | 0 refills | Status: AC

## 2020-06-10 MED ORDER — VORTIOXETINE HBR 10 MG PO TABS: 10.0000 mg | ORAL_TABLET | Freq: Every day | ORAL | 0 refills | Status: DC

## 2020-06-10 NOTE — Progress Notes (Signed)
Subjective:  Patient ID: Henry Figueroa, male    DOB: 11-17-1955  Age: 64 y.o. MRN: [id number]  CC: Annual Exam and Hypertension  This visit occurred during the SARS-CoV-2 public health emergency.  Safety protocols were in place, including screening questions prior to the visit, additional usage of staff PPE, and extensive cleaning of exam room while observing appropriate contact time as indicated for disinfecting solutions.    HPI Henry Figueroa presents for a CPX.  1.  He tells me that 4 days ago he was sitting in his home and he sneezed so hard that he flung himself forward and his face hit the ground.  He got up and noticed some blood on his left forehead and across his nose.  He did not lose consciousness and denies headache, neck pain, back pain, dizziness, lightheadedness, nosebleeds, or changes in his vision or hearing.  He has had intermittent nausea and vomiting prior to this injury but has had no nausea and vomiting since the injury.  2.  He has not been monitoring his blood pressure.  He tells me he thinks he is taking the ARB and the beta-blocker but not the thiazide diuretic.  He says he recently ran out of it.  He is active and denies any recent episodes of chest pain, shortness of breath, palpitations, edema, or fatigue.  3.  He complains of anhedonia, weight loss, feeling lonely, helpless, and hopeless.  4.  He complains of a 77-month history of decreased appetite, early satiety, intermittent nausea and vomiting, and dysphagia.  He denies heartburn or odynophagia.  Outpatient Medications Prior to Visit  Medication Sig Dispense Refill  . acetaminophen (TYLENOL) 650 MG CR tablet Take 1,300 mg by mouth 2 (two) times daily.    . APPLE CIDER VINEGAR PO Take by mouth.    . carvedilol (COREG) 12.5 MG tablet TAKE 1 TABLET BY MOUTH 2 (TWO) TIMES DAILY WITH A MEAL. 180 tablet 1  . Cholecalciferol (VITAMIN D3) 1.25 MG (50000 UT) CAPS TAKE 1 CAPSULE BY MOUTH EVERY SUNDAY 4 capsule 0    . olmesartan (BENICAR) 40 MG tablet TAKE 1 TABLET BY MOUTH EVERY DAY 90 tablet 1  . rosuvastatin (CRESTOR) 10 MG tablet TAKE 1 TABLET BY MOUTH EVERY DAY 90 tablet 1  . indapamide (LOZOL) 2.5 MG tablet TAKE 1 TABLET BY MOUTH EVERY DAY 90 tablet 1   No facility-administered medications prior to visit.    ROS Review of Systems  Constitutional: Positive for appetite change and unexpected weight change (wt loss). Negative for chills, diaphoresis, fatigue and fever.  HENT: Positive for trouble swallowing. Negative for congestion, facial swelling, nosebleeds, sinus pressure, sore throat and voice change.   Eyes: Negative.   Respiratory: Negative for cough, chest tightness, shortness of breath and wheezing.   Cardiovascular: Negative for chest pain, palpitations and leg swelling.  Gastrointestinal: Positive for nausea and vomiting. Negative for abdominal pain, blood in stool, constipation, diarrhea and rectal pain.  Endocrine: Negative.   Genitourinary: Negative.  Negative for difficulty urinating and penile pain.  Musculoskeletal: Negative.  Negative for arthralgias, myalgias and neck pain.  Skin: Negative for color change, pallor and rash.  Neurological: Positive for numbness. Negative for dizziness, seizures, weakness, light-headedness and headaches.       Chronic numbness in both lower extremities  Hematological: Negative for adenopathy. Does not bruise/bleed easily.  Psychiatric/Behavioral: Positive for dysphoric mood. Negative for behavioral problems, confusion, decreased concentration, sleep disturbance and suicidal ideas. The patient is not nervous/anxious.  Objective:  BP (!) 180/102   Pulse 87   Temp 98.4 F (36.9 C) (Oral)   Resp 16   Ht 6' (1.829 m)   Wt 239 lb 3.2 oz (108.5 kg)   SpO2 98%   BMI 32.44 kg/m   BP Readings from Last 3 Encounters:  06/10/20 (!) 180/102  10/09/19 136/88  07/07/19 140/86    Wt Readings from Last 3 Encounters:  06/10/20 239 lb 3.2 oz  (108.5 kg)  10/09/19 256 lb (116.1 kg)  07/07/19 256 lb (116.1 kg)    Physical Exam Vitals reviewed.  Constitutional:      General: He is not in acute distress.    Appearance: Normal appearance. He is not toxic-appearing or diaphoretic.  HENT:     Head: Abrasion present. No raccoon eyes, Battle's sign, contusion or masses.     Comments: Healed abrasions noted over his nose and left forehead.  There is no tenderness, swelling, ecchymosis, step-offs, or erythema.  The abrasions have healed with scab.  There are no exudates.  See photo.    Right Ear: No hemotympanum.     Left Ear: No hemotympanum.     Nose: Nose normal.  Eyes:     General: No scleral icterus.    Extraocular Movements: Extraocular movements intact.     Pupils: Pupils are equal, round, and reactive to light.  Cardiovascular:     Rate and Rhythm: Normal rate and regular rhythm.     Heart sounds: No murmur heard.      Comments: EKG- SR with 1st degree AV block Incomplete RBBB No Q waves Pulmonary:     Effort: Pulmonary effort is normal.     Breath sounds: No stridor. No wheezing, rhonchi or rales.  Abdominal:     General: Abdomen is flat.     Palpations: There is no mass.     Tenderness: There is no abdominal tenderness. There is no guarding.     Hernia: No hernia is present. There is no hernia in the left inguinal area or right inguinal area.  Genitourinary:    Pubic Area: No rash.      Penis: Normal.      Testes: Normal.        Right: Mass not present.        Left: Mass not present.     Epididymis:     Right: Normal. No mass or tenderness.     Left: Normal. No mass or tenderness.     Prostate: Enlarged. Not tender and no nodules present.     Rectum: Normal. Guaiac result negative. No mass, tenderness, anal fissure, external hemorrhoid or internal hemorrhoid. Normal anal tone.  Musculoskeletal:     Cervical back: Normal range of motion and neck supple.     Right lower leg: No edema.     Left lower leg: No  edema.  Lymphadenopathy:     Cervical: No cervical adenopathy.     Lower Body: No right inguinal adenopathy. No left inguinal adenopathy.  Neurological:     Mental Status: He is alert.  Psychiatric:        Attention and Perception: Attention normal. He is attentive. He does not perceive visual hallucinations.        Mood and Affect: Mood is depressed. Mood is not anxious. Affect is flat. Affect is not angry.        Speech: Speech normal. Speech is not rapid and pressured, delayed, slurred or tangential.  Behavior: Behavior normal. Behavior is not agitated, slowed, aggressive or withdrawn. Behavior is cooperative.        Thought Content: Thought content is not paranoid or delusional. Thought content does not include homicidal or suicidal ideation.        Cognition and Memory: Cognition normal.     Lab Results  Component Value Date   WBC 8.3 06/10/2020   HGB 15.0 06/10/2020   HCT 43.8 06/10/2020   PLT 219.0 06/10/2020   GLUCOSE 99 06/10/2020   CHOL 122 06/10/2020   TRIG 161.0 (H) 06/10/2020   HDL 72.30 06/10/2020   LDLDIRECT 111.0 05/30/2019   LDLCALC 17 06/10/2020   ALT 16 06/10/2020   AST 19 06/10/2020   NA 140 06/10/2020   K 4.0 06/10/2020   CL 100 06/10/2020   CREATININE 0.90 06/10/2020   BUN 12 06/10/2020   CO2 31 06/10/2020   TSH 0.87 06/10/2020   PSA 1.43 06/10/2020   INR 1.02 07/29/2018   HGBA1C 5.1 10/09/2019    CT ANGIO CHEST AORTA W/CM &/OR WO/CM  Result Date: 01/03/2019 CLINICAL DATA:  Follow-up thoracic aortic aneurysm EXAM: CT ANGIOGRAPHY CHEST WITH CONTRAST TECHNIQUE: Multidetector CT imaging of the chest was performed using the standard protocol during bolus administration of intravenous contrast. Multiplanar CT image reconstructions and MIPs were obtained to evaluate the vascular anatomy. CONTRAST:  43mL [id number] COMPARISON:  07/29/2018 FINDINGS: Cardiovascular: Thoracic aorta demonstrates a normal branching pattern. Mild atherosclerotic  calcifications are seen. There is fusiform dilatation of the ascending aorta to 4.8 cm similar to that seen on the prior exam. Sino-tubular junction measures approximately 39 mm. Normal tapering is noted the thoracic arch and descending thoracic aorta. Scattered coronary calcifications are noted. No large central pulmonary embolus is seen. Main pulmonary artery is mildly prominent stable from the prior exam. Mediastinum/Nodes: Esophagus as visualized is limits. Thoracic inlet is unremarkable. No hilar or mediastinal adenopathy is noted. Lungs/Pleura: Lungs are well aerated bilaterally. No focal infiltrate or sizable effusion is seen. Upper Abdomen: Diffuse decreased attenuation of the liver is noted consistent with fatty infiltration. Cholelithiasis is seen Musculoskeletal: Old rib fractures are again noted on the left stable from the prior exam. No acute bony abnormality is noted. Degenerative changes of the thoracic spine are noted. Review of the MIP images confirms the above findings. 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 Recommend semi-annual imaging followup by CTA or MRA and referral to cardiothoracic surgery if not already obtained. This recommendation follows 2010 ACCF/AHA/AATS/ACR/ASA/SCA/SCAI/SIR/STS/SVM Guidelines for the Diagnosis and Management of Patients With Thoracic Aortic Disease. Circulation. 2010; 121: E315-Q008. 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 Fatty liver with evidence of cholelithiasis similar to that seen on prior CT examination. Aortic Atherosclerosis (ICD10-I70.0). Electronically Signed   By: Inez Catalina M.D.   On: 01/03/2019 10:04    Assessment & Plan:   Henry Figueroa was seen today for annual exam and hypertension.  Diagnoses and all orders for this visit:  Routine general medical examination at a health care facility- Exam completed, labs reviewed, vaccines reviewed and updated, cancer screenings are  up-to-date, patient education was given.  Essential hypertension, benign- His blood pressure is not adequately well controlled.  I have asked him to restart indapamide.  Will continue the current doses of carvedilol and the ARB. -     EKG 12-Lead -     indapamide (LOZOL) 2.5 MG tablet; Take 1 tablet (2.5 mg total) by mouth daily. -  CBC with Differential/Platelet; Future -     Basic metabolic panel; Future -     TSH; Future -     Urinalysis, Routine w reflex microscopic; Future -     Urinalysis, Routine w reflex microscopic -     TSH -     Basic metabolic panel -     CBC with Differential/Platelet  Hyperlipidemia with target LDL less than 160- He has achieved his LDL goal and is doing well on the statin. -     Lipid panel; Future -     Hepatic function panel; Future -     Hepatic function panel -     Lipid panel  Vitamin D deficiency- His vitamin D level is normal now. -     VITAMIN D 25 Hydroxy (Vit-D Deficiency, Fractures); Future -     VITAMIN D 25 Hydroxy (Vit-D Deficiency, Fractures)  Flu vaccine need -     Flu Vaccine QUAD High Dose(Fluad)  Need for vaccination against Streptococcus pneumoniae -     Pneumococcal conjugate vaccine 13-valent  Early satiety- I have asked him to see GI to see if he needs to undergo upper endoscopy to screen for pathology. -     Ambulatory referral to Gastroenterology -     CBC with Differential/Platelet; Future -     CBC with Differential/Platelet  Stage 3a chronic kidney disease ([place])- His renal function has normalized. -     CBC with Differential/Platelet; Future -     Basic metabolic panel; Future -     Basic metabolic panel -     CBC with Differential/Platelet  Benign prostatic hyperplasia without lower urinary tract symptoms- His PSA is normal which is a reassuring sign that he does not have prostate cancer.  He has no symptoms that need to be treated. -     PSA; Future -     PSA  Sensory peripheral neuropathy- No complications  noted related to this.  Current severe episode of major depressive disorder without psychotic features without prior episode ([place])- I recommended that he start treating this with vortioxetine. -     vortioxetine HBr (TRINTELLIX) 5 MG TABS tablet; Take 1 tablet (5 mg total) by mouth daily for 7 days. -     vortioxetine HBr (TRINTELLIX) 10 MG TABS tablet; Take 1 tablet (10 mg total) by mouth daily for 21 days.  Abrasion of face, initial encounter- He has no signs of a head injury.  The abrasions are healing with no complications or evidence of infection.   I have changed Alpha Channell's indapamide. I am also having him start on vortioxetine HBr and vortioxetine HBr. Additionally, I am having him maintain his acetaminophen, APPLE CIDER VINEGAR PO, Vitamin D3, rosuvastatin, olmesartan, and carvedilol.  Meds ordered this encounter  Medications  . indapamide (LOZOL) 2.5 MG tablet    Sig: Take 1 tablet (2.5 mg total) by mouth daily.    Dispense:  90 tablet    Refill:  0  . vortioxetine HBr (TRINTELLIX) 5 MG TABS tablet    Sig: Take 1 tablet (5 mg total) by mouth daily for 7 days.    Dispense:  7 tablet    Refill:  0  . vortioxetine HBr (TRINTELLIX) 10 MG TABS tablet    Sig: Take 1 tablet (10 mg total) by mouth daily for 21 days.    Dispense:  21 tablet    Refill:  0   In addition to time spent on CPE, I spent 50  minutes in preparing to see the patient by review of recent labs, imaging and procedures, obtaining and reviewing separately obtained history, communicating with the patient and family or caregiver, ordering medications, tests or procedures, and documenting clinical information in the EHR including the differential Dx, treatment, and any further evaluation and other management of 1. Essential hypertension, benign 2. Hyperlipidemia with target LDL less than 160 3. Vitamin D deficiency 4. Early satiety 5. Stage 3a chronic kidney disease ([place]) 6. Benign prostatic hyperplasia without  lower urinary tract symptoms 7. Sensory peripheral neuropathy 8. Current severe episode of major depressive disorder without psychotic features without prior episode ([place]) 9. Abrasion of face, initial encounter     Follow-up: Return in about 4 weeks (around 07/08/2020).  Scarlette Calico, MD

## 2020-06-10 NOTE — Patient Instructions (Signed)

## 2020-06-11 ENCOUNTER — Encounter: Payer: Self-pay | Admitting: Internal Medicine

## 2020-06-11 DIAGNOSIS — S0081XA Abrasion of other part of head, initial encounter: Secondary | ICD-10-CM | POA: Insufficient documentation

## 2020-06-14 ENCOUNTER — Telehealth: Payer: Self-pay | Admitting: Internal Medicine

## 2020-06-14 NOTE — Telephone Encounter (Signed)
Please call to discuss lab results 

## 2020-06-14 NOTE — Telephone Encounter (Signed)
Dr. Ronnald Ramp has not reviewed results yet. Once results have been reviewed I will contact the pt to discuss.

## 2020-06-21 ENCOUNTER — Encounter: Payer: Self-pay | Admitting: Internal Medicine

## 2020-06-25 ENCOUNTER — Encounter: Payer: Self-pay | Admitting: Physician Assistant

## 2020-06-25 ENCOUNTER — Other Ambulatory Visit: Payer: Medicare Other

## 2020-06-25 ENCOUNTER — Ambulatory Visit (INDEPENDENT_AMBULATORY_CARE_PROVIDER_SITE_OTHER): Payer: Medicare Other | Admitting: Physician Assistant

## 2020-06-25 VITALS — BP 124/70 | HR 88 | Ht 72.0 in | Wt 225.0 lb

## 2020-06-25 DIAGNOSIS — R197 Diarrhea, unspecified: Secondary | ICD-10-CM | POA: Diagnosis not present

## 2020-06-25 DIAGNOSIS — R634 Abnormal weight loss: Secondary | ICD-10-CM

## 2020-06-25 DIAGNOSIS — Z86010 Personal history of colonic polyps: Secondary | ICD-10-CM

## 2020-06-25 DIAGNOSIS — R630 Anorexia: Secondary | ICD-10-CM | POA: Diagnosis not present

## 2020-06-25 DIAGNOSIS — R112 Nausea with vomiting, unspecified: Secondary | ICD-10-CM

## 2020-06-25 DIAGNOSIS — R1011 Right upper quadrant pain: Secondary | ICD-10-CM

## 2020-06-25 MED ORDER — ONDANSETRON HCL 4 MG PO TABS: 4.0000 mg | ORAL_TABLET | Freq: Three times a day (TID) | ORAL | 2 refills | Status: DC | PRN
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
## 2020-06-25 NOTE — Patient Instructions (Addendum)
If you are age 65 or older, your body mass index should be between 23-30. Your Body mass index is 30.52 kg/m. If this is out of the aforementioned range listed, please consider follow up with your Primary Care Provider.  If you are age 73 or younger, your body mass index should be between 19-25. Your Body mass index is 30.52 kg/m. If this is out of the aformentioned range listed, please consider follow up with your Primary Care Provider.   You have been scheduled for an abdominal ultrasound at [facility] (1st floor of hospital) on 06/28/20 at 11:30am. Please arrive 15 minutes prior to your appointment for registration. Make certain not to have anything to eat or drink 6 hours prior to your appointment. Should you need to reschedule your appointment, please contact radiology at [phone]. This test typically takes about 30 minutes to perform.  You have been scheduled for an endoscopy. Please follow written instructions given to you at your visit today. If you use inhalers (even only as needed), please bring them with you on the day of your procedure.  Your provider has requested that you go to the basement level for lab work before leaving today. Press "B" on the elevator. The lab is located at the first door on the left as you exit the elevator.  We have sent the following medications to your pharmacy for you to pick up at your convenience: Zofran every 6 hours as needed for nausea   Thank you for choosing me and [facility].  Ellouise Newer, PA-C

## 2020-06-26 ENCOUNTER — Other Ambulatory Visit: Payer: Medicare Other

## 2020-06-26 DIAGNOSIS — R112 Nausea with vomiting, unspecified: Secondary | ICD-10-CM

## 2020-06-26 DIAGNOSIS — R197 Diarrhea, unspecified: Secondary | ICD-10-CM

## 2020-06-26 DIAGNOSIS — R630 Anorexia: Secondary | ICD-10-CM

## 2020-06-26 DIAGNOSIS — R1011 Right upper quadrant pain: Secondary | ICD-10-CM

## 2020-06-26 DIAGNOSIS — R634 Abnormal weight loss: Secondary | ICD-10-CM

## 2020-06-26 DIAGNOSIS — Z86010 Personal history of colonic polyps: Secondary | ICD-10-CM

## 2020-06-27 ENCOUNTER — Encounter: Payer: Self-pay | Admitting: Cardiology

## 2020-06-27 ENCOUNTER — Ambulatory Visit (INDEPENDENT_AMBULATORY_CARE_PROVIDER_SITE_OTHER): Payer: Medicare Other | Admitting: Cardiology

## 2020-06-27 ENCOUNTER — Other Ambulatory Visit: Payer: Self-pay

## 2020-06-27 VITALS — BP 110/70 | HR 104 | Ht 72.0 in | Wt 226.0 lb

## 2020-06-27 DIAGNOSIS — G4733 Obstructive sleep apnea (adult) (pediatric): Secondary | ICD-10-CM | POA: Diagnosis not present

## 2020-06-27 DIAGNOSIS — I712 Thoracic aortic aneurysm, without rupture, unspecified: Secondary | ICD-10-CM

## 2020-06-27 DIAGNOSIS — I77810 Thoracic aortic ectasia: Secondary | ICD-10-CM | POA: Diagnosis not present

## 2020-06-27 DIAGNOSIS — I10 Essential (primary) hypertension: Secondary | ICD-10-CM | POA: Diagnosis not present

## 2020-06-27 NOTE — Patient Instructions (Addendum)
Medication Instructions:  The current medical regimen is effective;  continue present plan and medications.  *If you need a refill on your cardiac medications before your next appointment, please call your pharmacy*  Testing/Procedures: Non-Cardiac CT Angiography (CTA), is a special type of CT scan that uses a computer to produce multi-dimensional views of major blood vessels throughout the body. In CT angiography, a contrast material is injected through an IV to help visualize the blood vessels. This will be completed at this office.  There are no prior instructions.  Follow-Up: At [facility], you and your health needs are our priority.  As part of our continuing mission to provide you with exceptional heart care, we have created designated Provider Care Teams.  These Care Teams include your primary Cardiologist (physician) and Advanced Practice Providers (APPs -  Physician Assistants and Nurse Practitioners) who all work together to provide you with the care you need, when you need it.  We recommend signing up for the patient portal called "MyChart".  Sign up information is provided on this After Visit Summary.  MyChart is used to connect with patients for Virtual Visits (Telemedicine).  Patients are able to view lab/test results, encounter notes, upcoming appointments, etc.  Non-urgent messages can be sent to your provider as well.   To learn more about what you can do with MyChart, go to [email or web address].    Your next appointment:   12 month(s)  The format for your next appointment:   In Person  Provider:   Candee Furbish, MD   Thank you for choosing [facility]!!
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
## 2020-06-27 NOTE — Addendum Note (Signed)
Addended by: Shellia Cleverly on: 06/27/2020 02:54 PM   Modules accepted: Orders

## 2020-06-27 NOTE — Progress Notes (Signed)
Agree with the assessment and plan as outlined by Ellouise Newer, PA-C. Plan for expedited EGD next week. Ideally would also do colonoscopy, but felt unable to tolerate bowel prep at this juncture. Low threshold for expedited colonoscopy in near future pending EGD findings along with labs and imaging as ordered.   Bari Leib, DO, [facility]

## 2020-06-28 ENCOUNTER — Ambulatory Visit (HOSPITAL_COMMUNITY)
Admission: RE | Admit: 2020-06-28 | Discharge: 2020-06-28 | Disposition: A | Discharge status: Home / Self Care | Payer: Medicare Other | Source: Ambulatory Visit | Attending: Physician Assistant | Admitting: Physician Assistant

## 2020-06-28 DIAGNOSIS — R197 Diarrhea, unspecified: Secondary | ICD-10-CM | POA: Diagnosis present

## 2020-06-28 DIAGNOSIS — R1011 Right upper quadrant pain: Secondary | ICD-10-CM

## 2020-06-28 DIAGNOSIS — R112 Nausea with vomiting, unspecified: Secondary | ICD-10-CM | POA: Diagnosis not present

## 2020-06-28 DIAGNOSIS — Z86010 Personal history of colonic polyps: Secondary | ICD-10-CM | POA: Insufficient documentation

## 2020-06-28 DIAGNOSIS — R630 Anorexia: Secondary | ICD-10-CM

## 2020-06-28 DIAGNOSIS — R634 Abnormal weight loss: Secondary | ICD-10-CM

## 2020-06-28 DIAGNOSIS — Z860101 Personal history of adenomatous and serrated colon polyps: Secondary | ICD-10-CM

## 2020-06-28 LAB — GI PROFILE, STOOL, PCR

## 2020-06-28 LAB — OVA AND PARASITE EXAMINATION
CONCENTRATE RESULT:: NONE SEEN
MICRO NUMBER:: 11263381
SPECIMEN QUALITY:: ADEQUATE
TRICHROME RESULT:: NONE SEEN

## 2020-06-28 LAB — FECAL LACTOFERRIN, QUANT
Fecal Lactoferrin: NEGATIVE
MICRO NUMBER:: 11263516
SPECIMEN QUALITY:: ADEQUATE

## 2020-07-02 ENCOUNTER — Other Ambulatory Visit: Payer: Medicare Other

## 2020-07-02 ENCOUNTER — Ambulatory Visit (AMBULATORY_SURGERY_CENTER): Payer: Medicare Other | Admitting: Gastroenterology

## 2020-07-02 ENCOUNTER — Other Ambulatory Visit: Payer: Self-pay

## 2020-07-02 ENCOUNTER — Encounter: Payer: Self-pay | Admitting: Gastroenterology

## 2020-07-02 ENCOUNTER — Other Ambulatory Visit: Payer: Self-pay | Admitting: Gastroenterology

## 2020-07-02 VITALS — BP 119/80 | HR 59 | Temp 97.6°F | Resp 21 | Ht 72.0 in | Wt 226.0 lb

## 2020-07-02 DIAGNOSIS — K2980 Duodenitis without bleeding: Secondary | ICD-10-CM | POA: Diagnosis present

## 2020-07-02 DIAGNOSIS — K2100 Gastro-esophageal reflux disease with esophagitis, without bleeding: Secondary | ICD-10-CM

## 2020-07-02 DIAGNOSIS — K269 Duodenal ulcer, unspecified as acute or chronic, without hemorrhage or perforation: Secondary | ICD-10-CM

## 2020-07-02 DIAGNOSIS — K209 Esophagitis, unspecified without bleeding: Secondary | ICD-10-CM

## 2020-07-02 DIAGNOSIS — K2950 Unspecified chronic gastritis without bleeding: Secondary | ICD-10-CM | POA: Diagnosis not present

## 2020-07-02 DIAGNOSIS — R630 Anorexia: Secondary | ICD-10-CM

## 2020-07-02 DIAGNOSIS — K2970 Gastritis, unspecified, without bleeding: Secondary | ICD-10-CM | POA: Diagnosis not present

## 2020-07-02 DIAGNOSIS — K259 Gastric ulcer, unspecified as acute or chronic, without hemorrhage or perforation: Secondary | ICD-10-CM

## 2020-07-02 DIAGNOSIS — R112 Nausea with vomiting, unspecified: Secondary | ICD-10-CM

## 2020-07-02 DIAGNOSIS — R634 Abnormal weight loss: Secondary | ICD-10-CM

## 2020-07-02 DIAGNOSIS — K2990 Gastroduodenitis, unspecified, without bleeding: Secondary | ICD-10-CM

## 2020-07-02 MED ORDER — PANTOPRAZOLE SODIUM 40 MG PO TBEC: 40.0000 mg | DELAYED_RELEASE_TABLET | Freq: Two times a day (BID) | ORAL | 3 refills | Status: DC

## 2020-07-02 MED ORDER — ONDANSETRON HCL 4 MG PO TABS: 4.0000 mg | ORAL_TABLET | Freq: Three times a day (TID) | ORAL | 2 refills | Status: DC | PRN

## 2020-07-02 MED ORDER — SODIUM CHLORIDE 0.9 % IV SOLN: 500.0000 mL | Freq: Once | INTRAVENOUS | Status: DC

## 2020-07-02 MED ORDER — ONDANSETRON HCL 4 MG PO TABS: 4.0000 mg | ORAL_TABLET | Freq: Three times a day (TID) | ORAL | 1 refills | Status: DC | PRN

## 2020-07-02 MED ORDER — SUCRALFATE 1 GM/10ML PO SUSP: 1.0000 g | Freq: Four times a day (QID) | ORAL | 0 refills | Status: DC

## 2020-07-02 NOTE — Progress Notes (Signed)
pt tolerated well. VSS. awake and to recovery. Report given to RN. Bite block inserted and removed without trauma. 

## 2020-07-02 NOTE — Progress Notes (Signed)
AR - Check-in CW- VS  

## 2020-07-02 NOTE — Progress Notes (Signed)
Called to room to assist during endoscopic procedure.  Patient ID and intended procedure confirmed with present staff. Received instructions for my participation in the procedure from the performing physician.  

## 2020-07-02 NOTE — Telephone Encounter (Signed)
Patient perfers tabletes over solution

## 2020-07-02 NOTE — Op Note (Signed)
[facility] Patient Name: Henry Figueroa Procedure Date: 07/02/2020 1:30 PM MRN: [id number] Endoscopist: Gerrit Heck , MD Age: 64 Referring MD:  Date of Birth: March 26, 1956 Gender: Male Account #: [id number] Procedure:                Upper GI endoscopy Indications:              Decrease appetite, Nausea with vomiting, Weight loss                           64 yo male with recent onset nausea and non-bloody                            emesis with associated decreased appetite and                            significant weight loss (40#). Medicines:                Monitored Anesthesia Care Procedure:                Pre-Anesthesia Assessment:                           - Prior to the procedure, a History and Physical                            was performed, and patient medications and                            allergies were reviewed. The patient's tolerance of                            previous anesthesia was also reviewed. The risks                            and benefits of the procedure and the sedation                            options and risks were discussed with the patient.                            All questions were answered, and informed consent                            was obtained. Prior Anticoagulants: The patient has                            taken no previous anticoagulant or antiplatelet                            agents. ASA Grade Assessment: III - A patient with                            severe systemic disease. After reviewing the risks  and benefits, the patient was deemed in                            satisfactory condition to undergo the procedure.                           After obtaining informed consent, the endoscope was                            passed under direct vision. Throughout the                            procedure, the patient's blood pressure, pulse, and                            oxygen saturations were  monitored continuously. The                            Endoscope was introduced through the mouth, and                            advanced to the second part of duodenum. The upper                            GI endoscopy was accomplished without difficulty.                            The patient tolerated the procedure well. Scope In: Scope Out: Findings:                 LA Grade B (one or more mucosal breaks greater than                            5 mm, not extending between the tops of two mucosal                            folds) esophagitis with no bleeding was found in                            the lower third of the esophagus.                           The upper third of the esophagus and middle third                            of the esophagus were normal.                           Diffuse moderate inflammation characterized by                            congestion (edema) and erythema was found in the  entire examined stomach. There were several shallow                            and clean-based ulcers in the antrum. Several                            mucosal biopsies were taken throughout the stomach                            with a cold forceps for Helicobacter pylori                            testing. Estimated blood loss was minimal.                           Moderately erythematous mucosa without active                            bleeding and with no stigmata of bleeding was found                            in the duodenal bulb, in the first portion of the                            duodenum and in the second portion of the duodenum.                            There were a few supericial ulcers and erosions in                            the 2nd portion of the duodenum. Biopsies were                            taken with a cold forceps for histology. Estimated                            blood loss was minimal. Complications:            No immediate  complications. Estimated Blood Loss:     Estimated blood loss was minimal. Impression:               - LA Grade B esophagitis with no bleeding.                           - Normal upper third of esophagus and middle third                            of esophagus.                           - Gastritis. Biopsied.                           - Erythematous duodenopathy. Biopsied. Recommendation:           -  Patient has a contact number available for                            emergencies. The signs and symptoms of potential                            delayed complications were discussed with the                            patient. Return to normal activities tomorrow.                            Written discharge instructions were provided to the                            patient.                           - Advance diet as tolerated.                           - Continue present medications.                           - Await pathology results.                           - Check fasting serum gastrin in the morning.                           - Use Protonix (pantoprazole) 40 mg PO BID for 8                            weeks, then reduce to 40 mg daily. Wait to start                            after checking the fasting serum gastrin test.                           - Use sucralfate tablets 1 gram PO QID for 4 weeks.                           - Repeat upper endoscopy in 6-8 weeks to check                            healing.                           - Continue Zofran as prescribed.                           - If ongoing symptoms despite aggressive acid                            suppression therapy as above, will discuss further  diagnostic studies, to include possible                            colonoscopy, CT head, CT abdomen/pelvis, and/or GES. Gerrit Heck, MD 07/02/2020 1:58:06 PM

## 2020-07-02 NOTE — Patient Instructions (Signed)
Handout : esophagitis Advance diet  as tolerated Continue current medications Start protonix 40mg  twice daily for 8 wks, then reduce to 40mg  once daily Use sucralfate 85ml 4 times daily for 4 weeks Continue zofran.   YOU HAD AN ENDOSCOPIC PROCEDURE TODAY AT [facility]:   Refer to the procedure report that was given to you for any specific questions about what was found during the examination.  If the procedure report does not answer your questions, please call your gastroenterologist to clarify.  If you requested that your care partner not be given the details of your procedure findings, then the procedure report has been included in a sealed envelope for you to review at your convenience later.  YOU SHOULD EXPECT: Some feelings of bloating in the abdomen. Passage of more gas than usual.  Walking can help get rid of the air that was put into your GI tract during the procedure and reduce the bloating. If you had a lower endoscopy (such as a colonoscopy or flexible sigmoidoscopy) you may notice spotting of blood in your stool or on the toilet paper. If you underwent a bowel prep for your procedure, you may not have a normal bowel movement for a few days.  Please Note:  You might notice some irritation and congestion in your nose or some drainage.  This is from the oxygen used during your procedure.  There is no need for concern and it should clear up in a day or so.  SYMPTOMS TO REPORT IMMEDIATELY:  Following upper endoscopy (EGD)  Vomiting of blood or coffee ground material  New chest pain or pain under the shoulder blades  Painful or persistently difficult swallowing  New shortness of breath  Fever of 100F or higher  Black, tarry-looking stools  For urgent or emergent issues, a gastroenterologist can be reached at any hour by calling [phone]. Do not use MyChart messaging for urgent concerns.    end a small meal at first, but then you may proceed to your regular  diet.  Drink plenty of fluids but you should avoid alcoholic beverages for 24 hours.  ACTIVITY:  You should plan to take it easy for the rest of today and you should NOT DRIVE or use heavy machinery until tomorrow (because of the sedation medicines used during the test).    FOLLOW UP: Our staff will call the number listed on your records 48-72 hours following your procedure to check on you and address any questions or concerns that you may have regarding the information given to you following your procedure. If we do not reach you, we will leave a message.  We will attempt to reach you two times.  During this call, we will ask if you have developed any symptoms of COVID 19. If you develop any symptoms (ie: fever, flu-like symptoms, shortness of breath, cough etc.) before then, please call [phone].  If you test positive for Covid 19 in the 2 weeks post procedure, please call and report this information to [name].    If any biopsies were taken you will be contacted by phone or by letter within the next 1-3 weeks.  Please call us at [phone] if you have not heard about the biopsies in 3 weeks.   SIGNATURES/CONFIDENTIALITY: You and/or your care partner have signed paperwork which will be entered into your electronic medical record.  These signatures attest to the fact that that the information above on your After Visit Summary has been reviewed and  is understood.  Full responsibility of the confidentiality of this discharge information lies with you and/or your care-partner.

## 2020-07-04 ENCOUNTER — Telehealth: Payer: Self-pay

## 2020-07-04 NOTE — Telephone Encounter (Signed)
LVM

## 2020-07-04 NOTE — Telephone Encounter (Signed)
Attempted to reach pt. With follow-up call following endoscopic procedure 07/02/2020.  LM on pt. Voice mail to call if he has any questions or concerns.

## 2020-07-05 LAB — GASTRIN: Gastrin: 39 pg/mL (ref <=100)

## 2020-07-09 ENCOUNTER — Other Ambulatory Visit: Payer: Self-pay | Admitting: Internal Medicine

## 2020-07-09 ENCOUNTER — Encounter: Payer: Self-pay | Admitting: Internal Medicine

## 2020-07-09 DIAGNOSIS — F322 Major depressive disorder, single episode, severe without psychotic features: Secondary | ICD-10-CM

## 2020-07-09 MED ORDER — VORTIOXETINE HBR 10 MG PO TABS: 10.0000 mg | ORAL_TABLET | Freq: Every day | ORAL | 1 refills | Status: DC

## 2020-07-11 ENCOUNTER — Other Ambulatory Visit: Payer: Self-pay | Admitting: Gastroenterology

## 2020-07-11 DIAGNOSIS — R112 Nausea with vomiting, unspecified: Secondary | ICD-10-CM

## 2020-07-11 DIAGNOSIS — K209 Esophagitis, unspecified without bleeding: Secondary | ICD-10-CM

## 2020-07-12 ENCOUNTER — Other Ambulatory Visit: Payer: Self-pay | Admitting: Internal Medicine

## 2020-07-12 DIAGNOSIS — F322 Major depressive disorder, single episode, severe without psychotic features: Secondary | ICD-10-CM

## 2020-07-12 MED ORDER — DULOXETINE HCL 30 MG PO CPEP: 30.0000 mg | ORAL_CAPSULE | Freq: Every day | ORAL | 0 refills | Status: DC

## 2020-07-16 ENCOUNTER — Ambulatory Visit: Payer: Medicare Other | Admitting: Gastroenterology

## 2020-07-17 ENCOUNTER — Ambulatory Visit
Admission: RE | Admit: 2020-07-17 | Discharge: 2020-07-17 | Disposition: A | Discharge status: Home / Self Care | Payer: Medicare Other | Source: Ambulatory Visit | Attending: Cardiology | Admitting: Cardiology

## 2020-07-17 DIAGNOSIS — I77810 Thoracic aortic ectasia: Secondary | ICD-10-CM

## 2020-07-17 DIAGNOSIS — I712 Thoracic aortic aneurysm, without rupture, unspecified: Secondary | ICD-10-CM

## 2020-07-17 MED ORDER — IOPAMIDOL (ISOVUE-370) INJECTION 76%
75.0000 mL | Freq: Once | INTRAVENOUS | Status: AC | PRN
  Administered 2020-07-17: 75 mL via INTRAVENOUS

## 2020-07-23 ENCOUNTER — Other Ambulatory Visit: Payer: Self-pay | Admitting: *Deleted

## 2020-07-23 DIAGNOSIS — I712 Thoracic aortic aneurysm, without rupture, unspecified: Secondary | ICD-10-CM

## 2020-07-23 NOTE — Progress Notes (Signed)
Stable dilated ascending thoracic aorta, 4.9 cm.  Repeat CT scan in 1 year.  Donato Schultz, MD   Pt reviewed results via MyChart.  Order placed for repeat CTA in 1 year.

## 2020-07-26 ENCOUNTER — Other Ambulatory Visit: Payer: Self-pay | Admitting: Internal Medicine

## 2020-07-26 DIAGNOSIS — I10 Essential (primary) hypertension: Secondary | ICD-10-CM

## 2020-07-28 ENCOUNTER — Other Ambulatory Visit: Payer: Self-pay | Admitting: Gastroenterology

## 2020-07-31 ENCOUNTER — Other Ambulatory Visit: Payer: Self-pay | Admitting: Internal Medicine

## 2020-07-31 DIAGNOSIS — E785 Hyperlipidemia, unspecified: Secondary | ICD-10-CM

## 2020-08-02 ENCOUNTER — Encounter: Payer: Self-pay | Admitting: Internal Medicine

## 2020-08-05 ENCOUNTER — Other Ambulatory Visit: Payer: Self-pay | Admitting: Gastroenterology

## 2020-08-05 MED ORDER — ONDANSETRON HCL 4 MG PO TABS: 4.0000 mg | ORAL_TABLET | Freq: Three times a day (TID) | ORAL | 0 refills | Status: DC | PRN

## 2020-08-07 ENCOUNTER — Other Ambulatory Visit: Payer: Self-pay | Admitting: Internal Medicine

## 2020-08-07 DIAGNOSIS — G608 Other hereditary and idiopathic neuropathies: Secondary | ICD-10-CM

## 2020-08-07 DIAGNOSIS — F322 Major depressive disorder, single episode, severe without psychotic features: Secondary | ICD-10-CM

## 2020-08-07 DIAGNOSIS — M170 Bilateral primary osteoarthritis of knee: Secondary | ICD-10-CM

## 2020-08-07 MED ORDER — DULOXETINE HCL 60 MG PO CPEP: 60.0000 mg | ORAL_CAPSULE | Freq: Every day | ORAL | 1 refills | Status: DC

## 2020-08-08 ENCOUNTER — Other Ambulatory Visit: Payer: Self-pay | Admitting: Internal Medicine

## 2020-08-08 DIAGNOSIS — I10 Essential (primary) hypertension: Secondary | ICD-10-CM

## 2020-08-23 ENCOUNTER — Ambulatory Visit (AMBULATORY_SURGERY_CENTER): Payer: Medicare Other | Admitting: Gastroenterology

## 2020-08-23 ENCOUNTER — Encounter: Payer: Self-pay | Admitting: Gastroenterology

## 2020-08-23 ENCOUNTER — Other Ambulatory Visit: Payer: Self-pay

## 2020-08-23 VITALS — BP 122/86 | HR 65 | Temp 97.1°F | Resp 22 | Ht 72.0 in | Wt 226.0 lb

## 2020-08-23 DIAGNOSIS — R112 Nausea with vomiting, unspecified: Secondary | ICD-10-CM

## 2020-08-23 DIAGNOSIS — K2100 Gastro-esophageal reflux disease with esophagitis, without bleeding: Secondary | ICD-10-CM

## 2020-08-23 DIAGNOSIS — K2970 Gastritis, unspecified, without bleeding: Secondary | ICD-10-CM

## 2020-08-23 DIAGNOSIS — K2990 Gastroduodenitis, unspecified, without bleeding: Secondary | ICD-10-CM

## 2020-08-23 DIAGNOSIS — K219 Gastro-esophageal reflux disease without esophagitis: Secondary | ICD-10-CM

## 2020-08-23 DIAGNOSIS — K2950 Unspecified chronic gastritis without bleeding: Secondary | ICD-10-CM | POA: Diagnosis not present

## 2020-08-23 DIAGNOSIS — R630 Anorexia: Secondary | ICD-10-CM

## 2020-08-23 DIAGNOSIS — R634 Abnormal weight loss: Secondary | ICD-10-CM

## 2020-08-23 MED ORDER — PANTOPRAZOLE SODIUM 40 MG PO TBEC: DELAYED_RELEASE_TABLET | ORAL | 3 refills | Status: DC

## 2020-08-23 MED ORDER — SODIUM CHLORIDE 0.9 % IV SOLN: 500.0000 mL | Freq: Once | INTRAVENOUS | Status: DC

## 2020-08-23 NOTE — Progress Notes (Signed)
Report to PACU, RN, vss, BBS= Clear.  

## 2020-08-23 NOTE — Progress Notes (Signed)
VS by CW. ?

## 2020-08-23 NOTE — Patient Instructions (Addendum)
HANDOUTS PROVIDED ON: GASTRITIS  The biopsies taken today have been sent for pathology.  The results can take 1-3 weeks to receive.      You may resume your previous diet and medication schedule.  Reduce your sucralfate (Carafate) tablets to 1 g twice a day for 2 weeks then discontinue.  A prescription has been sent to your pharmacy for pantoprazole (Protonix) 40 mg twice a day for 4 weeks then reduce to 40 mg once a day.  Thank you for allowing [name] to care for you today!!!   YOU HAD AN ENDOSCOPIC PROCEDURE TODAY AT [facility]:   Refer to the procedure report that was given to you for any specific questions about what was found during the examination.  If the procedure report does not answer your questions, please call your gastroenterologist to clarify.  If you requested that your care partner not be given the details of your procedure findings, then the procedure report has been included in a sealed envelope for you to review at your convenience later.  YOU SHOULD EXPECT: Some feelings of bloating in the abdomen. Passage of more gas than usual.  Walking can help get rid of the air that was put into your GI tract during the procedure and reduce the bloating.   Please Note:  You might notice some irritation and congestion in your nose or some drainage.  This is from the oxygen used during your procedure.  There is no need for concern and it should clear up in a day or so.  SYMPTOMS TO REPORT IMMEDIATELY:   Following upper endoscopy (EGD)  Vomiting of blood or coffee ground material  New chest pain or pain under the shoulder blades  Painful or persistently difficult swallowing  New shortness of breath  Fever of 100F or higher  Black, tarry-looking stools  For urgent or emergent issues, a gastroenterologist can be reached at any hour by calling [phone]. Do not use MyChart messaging for urgent concerns.    DIET:  We do recommend a small meal at first, but then you  may proceed to your regular diet.  Drink plenty of fluids but you should avoid alcoholic beverages for 24 hours.  ACTIVITY:  You should plan to take it easy for the rest of today and you should NOT DRIVE or use heavy machinery until tomorrow (because of the sedation medicines used during the test).    FOLLOW UP: Our staff will call the number listed on your records Tuesday morning between 7:15 am and 8:15 am to check on you and address any questions or concerns that you may have regarding the information given to you following your procedure. If we do not reach you, we will leave a message.  We will attempt to reach you two times.  During this call, we will ask if you have developed any symptoms of COVID 19. If you develop any symptoms (ie: fever, flu-like symptoms, shortness of breath, cough etc.) before then, please call [phone].  If you test positive for Covid 19 in the 2 weeks post procedure, please call and report this information to [name].    If any biopsies were taken you will be contacted by phone or by letter within the next 1-3 weeks.  Please call us at [phone] if you have not heard about the biopsies in 3 weeks.    SIGNATURES/CONFIDENTIALITY: You and/or your care partner have signed paperwork which will be entered into your electronic medical record.  These  signatures attest to the fact that that the information above on your After Visit Summary has been reviewed and is understood.  Full responsibility of the confidentiality of this discharge information lies with you and/or your care-partner. 

## 2020-08-23 NOTE — Op Note (Signed)
[facility] Patient Name: Henry Figueroa Procedure Date: 08/23/2020 9:22 AM MRN: [id number] Endoscopist: Gerrit Heck , MD Age: 65 Referring MD:  Date of Birth: [birth year] Gender: Male Account #: [id number] Procedure:                Upper GI endoscopy Indications:              Follow-up of reflux esophagitis, Follow-up of                            duodenitis, Follow-up of gastritis, Nausea with                            vomiting                           EGD in 06/2020 with LA Grade B esophagitis,                            gastritis and duodenitis with gastric and duodenal                            ulcers/erosions. Increased PPI to Protonix 40 mg                            BID and added CarafateQID, with clinical                            improvement. Still with nausea, but reports this                            has improved. Appetite still reduced, but weight                            stable. Presents today to evaluate for mucosal                            improvement. Medicines:                Monitored Anesthesia Care Procedure:                Pre-Anesthesia Assessment:                           - Prior to the procedure, a History and Physical                            was performed, and patient medications and                            allergies were reviewed. The patient's tolerance of                            previous anesthesia was also reviewed. The risks                            and benefits  of the procedure and the sedation                            options and risks were discussed with the patient.                            All questions were answered, and informed consent                            was obtained. Prior Anticoagulants: The patient has                            taken no previous anticoagulant or antiplatelet                            agents. ASA Grade Assessment: II - A patient with                            mild systemic  disease. After reviewing the risks                            and benefits, the patient was deemed in                            satisfactory condition to undergo the procedure.                           After obtaining informed consent, the endoscope was                            passed under direct vision. Throughout the                            procedure, the patient's blood pressure, pulse, and                            oxygen saturations were monitored continuously. The                            Endoscope was introduced through the mouth, and                            advanced to the second part of duodenum. The upper                            GI endoscopy was accomplished without difficulty.                            The patient tolerated the procedure well. Scope In: Scope Out: Findings:                 The examined esophagus was normal.  The Z-line was regular and was found 45 cm from the                            incisors.                           Diffuse mild inflammation characterized by                            congestion (edema) and erythema was found in the                            gastric fundus, in the gastric body and in the                            gastric antrum. No ulcers or erosions. Overall,                            this is improved from the prior study. Biopsies                            were taken with a cold forceps for Helicobacter                            pylori testing. Estimated blood loss was minimal.                           Localized mildly erythematous mucosa without active                            bleeding was found in the posterior duodenal bulb,                            with a single well-healed erosion. This was much                            improved compared with the previous study.                           The second portion of the duodenum was normal. Complications:            No immediate  complications. Estimated Blood Loss:     Estimated blood loss was minimal. Impression:               - Normal esophagus.                           - Z-line regular, 45 cm from the incisors.                           - Gastritis. Biopsied.                           - Erythematous duodenopathy.                           -  Normal second portion of the duodenum. Recommendation:           - Patient has a contact number available for                            emergencies. The signs and symptoms of potential                            delayed complications were discussed with the                            patient. Return to normal activities tomorrow.                            Written discharge instructions were provided to the                            patient.                           - Resume previous diet.                           - Continue present medications.                           - Await pathology results.                           - Reduce sucralfate tablets to 1 gram PO BID for 2                            weeks, then ok to discontinue.                           - Use Protonix (pantoprazole) 40 mg PO BID for 4                            weeks more, then reduce to 40 mg daily for                            continued control of reflux.                           - Return to GI clinic in 2-3 months, or sooner prn.                            If ongoing symptoms, will plan for additional                            work-up to include possible cross sectional                            imaging, GES, and/or HIDA scan, pending  symptomatology. Gerrit Heck, MD 08/23/2020 9:56:54 AM

## 2020-08-23 NOTE — Progress Notes (Signed)
Called to room to assist during endoscopic procedure.  Patient ID and intended procedure confirmed with present staff. Received instructions for my participation in the procedure from the performing physician.  

## 2020-08-27 ENCOUNTER — Telehealth: Payer: Self-pay | Admitting: *Deleted

## 2020-08-27 NOTE — Telephone Encounter (Signed)
  Follow up Call-  Call back number 08/23/2020 07/02/2020 07/07/2019  Post procedure Call Back phone  # [phone] cell [phone]  Permission to leave phone message Yes Yes Yes  Some recent data might be hidden     Patient questions:  Do you have a fever, pain , or abdominal swelling? No. Pain Score  0 *  Have you tolerated food without any problems? Yes.    Have you been able to return to your normal activities? Yes.    Do you have any questions about your discharge instructions: Diet   No. Medications  No. Follow up visit  No.  Do you have questions or concerns about your Care? No.  Actions: * If pain score is 4 or above: No action needed, pain <4.  1. Have you developed a fever since your procedure? no  2.   Have you had an respiratory symptoms (SOB or cough) since your procedure? no  3.   Have you tested positive for COVID 19 since your procedure no  4.   Have you had any family members/close contacts diagnosed with the COVID 19 since your procedure?  no   If yes to any of these questions please route to Joylene John, RN and Joella Prince, RN

## 2020-08-29 ENCOUNTER — Other Ambulatory Visit: Payer: Self-pay | Admitting: Gastroenterology

## 2020-09-06 ENCOUNTER — Other Ambulatory Visit: Payer: Self-pay | Admitting: Internal Medicine

## 2020-09-06 DIAGNOSIS — I10 Essential (primary) hypertension: Secondary | ICD-10-CM

## 2020-09-09 MED ORDER — INDAPAMIDE 2.5 MG PO TABS: 2.5000 mg | ORAL_TABLET | Freq: Every day | ORAL | 0 refills | Status: DC

## 2020-10-01 ENCOUNTER — Encounter: Payer: Self-pay | Admitting: Gastroenterology

## 2020-10-23 NOTE — Telephone Encounter (Signed)
rx was escribed on 08/07/20 for sulcrafate 1 g

## 2020-11-12 ENCOUNTER — Telehealth: Payer: Self-pay | Admitting: Gastroenterology

## 2020-11-12 NOTE — Telephone Encounter (Signed)
Scheduled the patient for 11/15/2020 with Dr Bryan Lemma in [facility] office due to nausea and vomiting. Patient states that this is the way it started before. Expressed to him he was to come back in 2-3 months and if symptomatic we were going to have further testing done. Patient verbalized understanding and will come to the [facility] office for his appt.

## 2020-11-12 NOTE — Telephone Encounter (Signed)
Left a voicemail for the patient to contact the office regarding his request for medications and the nausea he is having

## 2020-11-15 ENCOUNTER — Other Ambulatory Visit: Payer: Self-pay

## 2020-11-15 ENCOUNTER — Ambulatory Visit (INDEPENDENT_AMBULATORY_CARE_PROVIDER_SITE_OTHER): Payer: Medicare Other | Admitting: Gastroenterology

## 2020-11-15 ENCOUNTER — Encounter: Payer: Self-pay | Admitting: Gastroenterology

## 2020-11-15 VITALS — BP 122/80 | HR 85 | Ht 72.0 in | Wt 235.0 lb

## 2020-11-15 DIAGNOSIS — R634 Abnormal weight loss: Secondary | ICD-10-CM

## 2020-11-15 DIAGNOSIS — K219 Gastro-esophageal reflux disease without esophagitis: Secondary | ICD-10-CM | POA: Diagnosis not present

## 2020-11-15 DIAGNOSIS — R112 Nausea with vomiting, unspecified: Secondary | ICD-10-CM

## 2020-11-15 DIAGNOSIS — G43A1 Cyclical vomiting, intractable: Secondary | ICD-10-CM

## 2020-11-15 MED ORDER — PANTOPRAZOLE SODIUM 40 MG PO TBEC: 40.0000 mg | DELAYED_RELEASE_TABLET | Freq: Two times a day (BID) | ORAL | 0 refills | Status: DC

## 2020-11-15 MED ORDER — ONDANSETRON HCL 4 MG PO TABS: 4.0000 mg | ORAL_TABLET | Freq: Four times a day (QID) | ORAL | 3 refills | Status: DC | PRN

## 2020-11-15 NOTE — Progress Notes (Signed)
P  Chief Complaint:    Nausea/vomiting  GI History: 65 year old male history of atrial fibrillation, OSA (on CPAP), Hyperlipidemia, gout (diet-controlled), and additional medical history as below, follows in the GI clinic for the following:  1) Weight loss: 40# weight loss over 6 months from May- November 2021.  Some weight loss was intentional, but not to the degree.  Some decrease in appetite and nausea/vomiting. - 05/2020: Normal CBC, CMP, vitamin D, TSH - 06/2020: Normal/negative GI PCR panel, ova and parasite, fecal lactoferrin - 06/2020: RUQ [name]: Cholelithiasis without cholecystitis, normal CBD, normal liver - EGD (06/2020): Erosive esophagitis, gastritis/duodenitis with antral ulcers and duodenal ulcers.  Started on high-dose Protonix and Carafate - Normal fasting gastrin - 07/2020: Weight stabilized.  Still with nausea but improved -Repeat EGD (07/2020): Significant improvement with mild gastritis/duodenitis.  Started to titrate Protonix and Carafate  2) Change in stools: Loose, nonbloody stools, with 3-4 BM/day starting 2021.  Resolved early 2022.  Endoscopic History: - Colonoscopy (11/2008): Normal per patient - Colonoscopy (06/2019): Fair prep, 6 tubular adenomas measuring 5-9 mm in sigmoid, descending, transverse, diverticulosis, 3 nonbleeding colonic AVMs, internal hemorrhoids.  Repeat in 1-2 years due to suboptimal bowel prep - EGD (06/2020): LA Grade B esophagitis, moderate gastritis with antral ulcers (path negative for H. pylori), moderate duodenitis with shallow ulcers in D2 (path benign) -EGD (07/2020): Normal esophagus, mild gastritis but no ulcers, mild duodenitis and 1 healing ulcer.  Overall much improved.  Path negative for H. pylori  HPI:     Patient is a 65 y.o. male presenting to the Gastroenterology Clinic for follow-up.  Last seen by Ellouise Newer on 06/25/2020 for evaluation of nausea/vomiting and weight loss and separately with change in bowel  habits/diarrhea.  Proceeded with EGD as he did not think he preparation for colonoscopy.  Started on Zofran.  Completed EGD x2 as outlined above and started on high-dose PPI and Carafate.  Today, he reports the nausea/vomiting had resolved in Feb, and his appetite was improving and weight was increasing (256# in 09/2019, 225# in 05/2020, 235# today).  He had weaned off carafate and protonix completely, without any recurrence in nausea/vomiting until 10 days ago. Sudden onset recurrence of nausea and non-bloody emesis after violent sneezing episode.  Symptoms lasted that evening and into the following morning, he has had lingering, episodic nausea and occasional dry heaves since then. Sxs actually tend to occur after violent sneeze or cough.   No NSAIDs, steroids.   Bowel habits back to normal.  No fever or chills.  Review of systems:     No chest pain, no SOB, no fevers, no urinary sx   Past Medical History:  Diagnosis Date  . Arthritis    "knees" (06/07/2018)  . Atrial fibrillation ([place]) 2019   ablation done   . Basal cell carcinoma    "off my back" (06/07/2018)  . High cholesterol   . History of gout X 1   "left toe; went away w/diet change" (06/07/2018)  . Hypertension   . OSA on CPAP    "got October 2019" (06/07/2018)  . Sleep apnea    pt does not wear a c-pap  . Substance abuse ([place])    recovering alcoholic    Patient's surgical history, family medical history, social history, medications and allergies were all reviewed in Epic    Current Outpatient Medications  Medication Sig Dispense Refill  . acetaminophen (TYLENOL) 650 MG CR tablet Take 1,300 mg by mouth 2 (two) times  daily.    . carvedilol (COREG) 12.5 MG tablet TAKE 1 TABLET BY MOUTH 2 (TWO) TIMES DAILY WITH A MEAL. 180 tablet 1  . Cholecalciferol (VITAMIN D3) 1.25 MG (50000 UT) CAPS TAKE 1 CAPSULE BY MOUTH EVERY SUNDAY 4 capsule 0  . DULoxetine (CYMBALTA) 60 MG capsule Take 1 capsule (60 mg total) by mouth daily. 90  capsule 1  . indapamide (LOZOL) 2.5 MG tablet Take 1 tablet (2.5 mg total) by mouth daily. 90 tablet 0  . olmesartan (BENICAR) 40 MG tablet TAKE 1 TABLET BY MOUTH EVERY DAY 90 tablet 1  . ondansetron (ZOFRAN) 4 MG tablet Take 4 mg by mouth every 8 (eight) hours as needed for nausea or vomiting.    . pantoprazole (PROTONIX) 40 MG tablet 40 mg twice a day for 4 weeks then reduce to 40 mg once a day 90 tablet 3  . rosuvastatin (CRESTOR) 10 MG tablet TAKE 1 TABLET BY MOUTH EVERY DAY 90 tablet 1  . sucralfate (CARAFATE) 1 g tablet TAKE 1 TABLET BY MOUTH FOUR TIMES A DAY FOR 28 DAYS (Patient taking differently: Take 1 g by mouth 2 (two) times daily. For 2 weeks then discontinue.  Per VC, DO 08/23/20) 112 tablet 0   No current facility-administered medications for this visit.    Physical Exam:     There were no vitals taken for this visit.  GENERAL:  Pleasant male in NAD PSYCH: : Cooperative, normal affect EENT:  conjunctiva pink, mucous membranes moist, neck supple without masses CARDIAC:  RRR, no murmur heard, no peripheral edema PULM: Normal respiratory effort, lungs CTA bilaterally, no wheezing ABDOMEN:  Nondistended, soft, nontender. No obvious masses, no hepatomegaly,  normal bowel sounds SKIN:  turgor, no lesions seen Musculoskeletal:  Normal muscle tone, normal strength NEURO: Alert and oriented x 3, no focal neurologic deficits   IMPRESSION and PLAN:    1) Nausea/Vomiting 2) Weight loss 3) GERD with erosive esophagitis  Exact etiology for symptoms unclear, although history today of predominantly posttussive/post sneeze symptoms is interesting and could be suggestive of reflux etiology.  Plan for continued evaluation as below:  - Resume Protonix 40 mg bid x4 weeks for diagnostic and therapeutic intent, then can reduce to 40 mg/daily symptoms improving/not recurrent - Refill Zofran - CT head to rule out intracranial pathology - CT abdomen/pelvis to rule out extraluminal  pathology  4) Colon cancer screening - Due for age-appropriate ongoing CRC screening.  Discussed with patient today, and he would like to do this after continued work-up of the more acute, pressing issues as above.  We will schedule to be done later this year        Lavena Bullion ,DO, FACG 11/15/2020, 1:12 PM

## 2020-11-15 NOTE — Patient Instructions (Signed)
If you are age 66 or older, your body mass index should be between 23-30. Your Body mass index is 31.87 kg/m. If this is out of the aforementioned range listed, please consider follow up with your Primary Care Provider.  If you are age 8 or younger, your body mass index should be between 19-25. Your Body mass index is 31.87 kg/m. If this is out of the aformentioned range listed, please consider follow up with your Primary Care Provider.   Due to recent changes in healthcare laws, you may see the results of your imaging and laboratory studies on MyChart before your provider has had a chance to review them.  We understand that in some cases there may be results that are confusing or concerning to you. Not all laboratory results come back in the same time frame and the provider may be waiting for multiple results in order to interpret others.  Please give [name] 48 hours in order for your provider to thoroughly review all the results before contacting the office for clarification of your results.   You will be contacted by [place] in the next 2 days to arrange a CT of the abdomen/pelvis.  The number on your caller ID will be [phone], please answer when they call.  If you have not heard from them in 2 days please call [phone] to schedule.     Follow up in 3-6 months, you will need to call the office at [phone] to schedule  Thank you for choosing me and [facility].  Vito Cirigliano, D.O.

## 2020-11-25 ENCOUNTER — Other Ambulatory Visit: Payer: Self-pay

## 2020-11-25 ENCOUNTER — Ambulatory Visit (HOSPITAL_COMMUNITY)
Admission: RE | Admit: 2020-11-25 | Discharge: 2020-11-25 | Disposition: A | Discharge status: Home / Self Care | Payer: Medicare Other | Source: Ambulatory Visit | Attending: Gastroenterology | Admitting: Gastroenterology

## 2020-11-25 DIAGNOSIS — G43A1 Cyclical vomiting, intractable: Secondary | ICD-10-CM | POA: Insufficient documentation

## 2020-11-25 DIAGNOSIS — K219 Gastro-esophageal reflux disease without esophagitis: Secondary | ICD-10-CM | POA: Insufficient documentation

## 2020-11-25 DIAGNOSIS — R112 Nausea with vomiting, unspecified: Secondary | ICD-10-CM | POA: Diagnosis present

## 2020-11-25 DIAGNOSIS — R634 Abnormal weight loss: Secondary | ICD-10-CM | POA: Diagnosis present

## 2020-11-25 LAB — POCT I-STAT CREATININE: Creatinine, Ser: 1.1 mg/dL (ref 0.61–1.24)

## 2020-11-25 MED ORDER — IOHEXOL 300 MG/ML  SOLN
100.0000 mL | Freq: Once | INTRAMUSCULAR | Status: AC | PRN
  Administered 2020-11-25: 100 mL via INTRAVENOUS

## 2020-11-27 ENCOUNTER — Other Ambulatory Visit: Payer: Self-pay | Admitting: General Surgery

## 2020-11-27 ENCOUNTER — Telehealth: Payer: Self-pay | Admitting: General Surgery

## 2020-11-27 DIAGNOSIS — K869 Disease of pancreas, unspecified: Secondary | ICD-10-CM

## 2020-11-27 DIAGNOSIS — K862 Cyst of pancreas: Secondary | ICD-10-CM

## 2020-11-27 NOTE — Telephone Encounter (Signed)
-----   Message from [place], DO sent at 11/25/2020  6:51 PM EDT ----- CT abdomen/pelvis reviewed notable for large gallstones (up to 4.5 cm) without cholecystitis or duct dilation.  Cystic lesion in the tail of the pancreas measuring 2.1 x 1.8 cm without duct dilation.  Otherwise normal-appearing GI tract, no lymph node enlargement.    Separately, tiny right kidney stone and mild bladder wall thickening.  Recommend follow-up with PCM.  -Plan for MRI pancreas protocol to further characterize cystic lesion in the pancreatic tail.  Depending on MRI findings, may also plan for EUS

## 2020-11-27 NOTE — Telephone Encounter (Signed)
Spoke with the patient regarding his ct of head. Expressed to him the findings, the patient verbalized understanding and it was suggested for him to follow up with PCP

## 2020-11-27 NOTE — Telephone Encounter (Signed)
-----   Message from [place], DO sent at 11/25/2020  6:52 PM EDT ----- CT head without acute intracranial pathology or explanation for chronic nausea/vomiting.  There are some changes c/w chronic microvascular ischemic disease, and can follow-up with PCM to discuss whether or not further evaluation is needed for this finding.

## 2020-11-27 NOTE — Telephone Encounter (Signed)
Gave the patient his results per Dr De Hollingshead message. The patient verbalized understanding and we will schedule an MRI of pancreas for further evaluation. The patient is aware that he will be contacted by scheduling for this MRI. Patient instructed to follow up with his PCP

## 2020-12-08 ENCOUNTER — Other Ambulatory Visit: Payer: Self-pay | Admitting: Internal Medicine

## 2020-12-08 ENCOUNTER — Other Ambulatory Visit: Payer: Self-pay | Admitting: Gastroenterology

## 2020-12-08 DIAGNOSIS — I10 Essential (primary) hypertension: Secondary | ICD-10-CM

## 2020-12-27 ENCOUNTER — Other Ambulatory Visit: Payer: Self-pay

## 2020-12-30 ENCOUNTER — Encounter: Payer: Self-pay | Admitting: Internal Medicine

## 2020-12-30 ENCOUNTER — Ambulatory Visit (INDEPENDENT_AMBULATORY_CARE_PROVIDER_SITE_OTHER): Payer: Medicare Other | Admitting: Internal Medicine

## 2020-12-30 ENCOUNTER — Other Ambulatory Visit: Payer: Self-pay

## 2020-12-30 VITALS — BP 132/82 | HR 84 | Temp 98.2°F | Resp 16 | Ht 72.0 in | Wt 242.0 lb

## 2020-12-30 DIAGNOSIS — I10 Essential (primary) hypertension: Secondary | ICD-10-CM | POA: Diagnosis not present

## 2020-12-30 DIAGNOSIS — K8020 Calculus of gallbladder without cholecystitis without obstruction: Secondary | ICD-10-CM

## 2020-12-30 DIAGNOSIS — N1831 Chronic kidney disease, stage 3a: Secondary | ICD-10-CM | POA: Diagnosis not present

## 2020-12-30 DIAGNOSIS — R112 Nausea with vomiting, unspecified: Secondary | ICD-10-CM | POA: Diagnosis not present

## 2020-12-30 DIAGNOSIS — F1290 Cannabis use, unspecified, uncomplicated: Secondary | ICD-10-CM

## 2020-12-30 DIAGNOSIS — R1116 Cannabis hyperemesis syndrome: Secondary | ICD-10-CM

## 2020-12-30 LAB — CBC WITH DIFFERENTIAL/PLATELET
Basophils Absolute: 0.1 10*3/uL (ref 0.0–0.1)
Basophils Relative: 1.2 % (ref 0.0–3.0)
Eosinophils Absolute: 0.1 10*3/uL (ref 0.0–0.7)
Eosinophils Relative: 2.6 % (ref 0.0–5.0)
HCT: 41.9 % (ref 39.0–52.0)
Hemoglobin: 14.2 g/dL (ref 13.0–17.0)
Lymphocytes Relative: 43.1 % (ref 12.0–46.0)
Lymphs Abs: 2.4 10*3/uL (ref 0.7–4.0)
MCHC: 33.9 g/dL (ref 30.0–36.0)
MCV: 98.7 fl (ref 78.0–100.0)
Monocytes Absolute: 0.6 10*3/uL (ref 0.1–1.0)
Monocytes Relative: 10.4 % (ref 3.0–12.0)
Neutro Abs: 2.4 10*3/uL (ref 1.4–7.7)
Neutrophils Relative %: 42.7 % — ABNORMAL LOW (ref 43.0–77.0)
Platelets: 266 10*3/uL (ref 150.0–400.0)
RBC: 4.24 Mil/uL (ref 4.22–5.81)
RDW: 14 % (ref 11.5–15.5)
WBC: 5.5 10*3/uL (ref 4.0–10.5)

## 2020-12-30 NOTE — Progress Notes (Signed)
Subjective:  Patient ID: Henry Figueroa, male    DOB: 1955-11-11  Age: 65 y.o. MRN: [id number]  CC: Hypertension  This visit occurred during the SARS-CoV-2 public health emergency.  Safety protocols were in place, including screening questions prior to the visit, additional usage of staff PPE, and extensive cleaning of exam room while observing appropriate contact time as indicated for disinfecting solutions.    HPI Henry Figueroa presents for f/up -   He has recently been seeing a gastroenterologist for recurrent episodes of nausea, vomiting, loss of appetite, and weight loss.  His work-up revealed UGI ulcers and gallstones.  He denies abdominal pain.  He uses THC to control chronic musculoskeletal pain.  He tells me all the symptoms have resolved.  His appetite is back to normal and he is gaining weight.  Outpatient Medications Prior to Visit  Medication Sig Dispense Refill  . acetaminophen (TYLENOL) 650 MG CR tablet Take 1,300 mg by mouth 2 (two) times daily.    . carvedilol (COREG) 12.5 MG tablet TAKE 1 TABLET BY MOUTH 2 (TWO) TIMES DAILY WITH A MEAL. 180 tablet 1  . Cholecalciferol (VITAMIN D3) 1.25 MG (50000 UT) CAPS TAKE 1 CAPSULE BY MOUTH EVERY SUNDAY 4 capsule 0  . indapamide (LOZOL) 2.5 MG tablet TAKE 1 TABLET BY MOUTH EVERY DAY 90 tablet 0  . olmesartan (BENICAR) 40 MG tablet TAKE 1 TABLET BY MOUTH EVERY DAY 90 tablet 1  . ondansetron (ZOFRAN) 4 MG tablet Take 1 tablet (4 mg total) by mouth every 6 (six) hours as needed for up to 30 doses for nausea or vomiting. 30 tablet 3  . pantoprazole (PROTONIX) 40 MG tablet TAKE 1 TABLET BY MOUTH TWICE A DAY 60 tablet 0  . rosuvastatin (CRESTOR) 10 MG tablet TAKE 1 TABLET BY MOUTH EVERY DAY 90 tablet 1  . DULoxetine (CYMBALTA) 60 MG capsule Take 1 capsule (60 mg total) by mouth daily. (Patient not taking: Reported on 11/15/2020) 90 capsule 1   No facility-administered medications prior to visit.    ROS Review of Systems  Constitutional:  Positive for unexpected weight change (wt gain). Negative for chills, diaphoresis and fatigue.  HENT: Negative for trouble swallowing.   Eyes: Negative.   Respiratory: Negative for cough, chest tightness, shortness of breath and wheezing.   Cardiovascular: Negative for chest pain, palpitations and leg swelling.  Gastrointestinal: Negative for abdominal pain, constipation, diarrhea, nausea and vomiting.  Endocrine: Negative.   Genitourinary: Negative.   Musculoskeletal: Positive for arthralgias. Negative for back pain and myalgias.  Skin: Negative.   Neurological: Negative.  Negative for dizziness, weakness, light-headedness and headaches.  Hematological: Negative for adenopathy. Does not bruise/bleed easily.  Psychiatric/Behavioral: Negative.     Objective:  BP 132/82 (BP Location: Left Arm, Patient Position: Sitting, Cuff Size: Large)   Pulse 84   Temp 98.2 F (36.8 C) (Oral)   Resp 16   Ht 6' (1.829 m)   Wt 242 lb (109.8 kg)   SpO2 94%   BMI 32.82 kg/m   BP Readings from Last 3 Encounters:  12/30/20 132/82  11/15/20 122/80  08/23/20 122/86    Wt Readings from Last 3 Encounters:  12/30/20 242 lb (109.8 kg)  11/15/20 235 lb (106.6 kg)  08/23/20 226 lb (102.5 kg)    Physical Exam Vitals reviewed.  Constitutional:      Appearance: Normal appearance.  HENT:     Nose: Nose normal.     Mouth/Throat:     Mouth: Mucous membranes are  moist.  Eyes:     General: No scleral icterus.    Conjunctiva/sclera: Conjunctivae normal.  Cardiovascular:     Rate and Rhythm: Normal rate and regular rhythm.     Pulses: Normal pulses.     Heart sounds: No murmur heard.   Pulmonary:     Effort: Pulmonary effort is normal.     Breath sounds: No stridor. No wheezing, rhonchi or rales.  Abdominal:     General: Abdomen is protuberant. Bowel sounds are normal. There is no distension.     Palpations: Abdomen is soft. There is no hepatomegaly, splenomegaly or mass.     Tenderness: There  is no abdominal tenderness.  Musculoskeletal:        General: Normal range of motion.     Cervical back: Neck supple.     Right lower leg: No edema.     Left lower leg: No edema.  Lymphadenopathy:     Cervical: No cervical adenopathy.  Skin:    General: Skin is warm and dry.  Neurological:     General: No focal deficit present.     Mental Status: He is alert.  Psychiatric:        Mood and Affect: Mood normal.        Behavior: Behavior normal.     Lab Results  Component Value Date   WBC 5.5 12/30/2020   HGB 14.2 12/30/2020   HCT 41.9 12/30/2020   PLT 266.0 12/30/2020   GLUCOSE 103 (H) 12/30/2020   CHOL 122 06/10/2020   TRIG 161.0 (H) 06/10/2020   HDL 72.30 06/10/2020   LDLDIRECT 111.0 05/30/2019   LDLCALC 17 06/10/2020   ALT 16 06/10/2020   AST 19 06/10/2020   NA 141 12/30/2020   K 3.9 12/30/2020   CL 101 12/30/2020   CREATININE 0.97 12/30/2020   BUN 14 12/30/2020   CO2 30 12/30/2020   TSH 0.87 06/10/2020   PSA 1.43 06/10/2020   INR 1.02 07/29/2018   HGBA1C 5.1 10/09/2019    CT HEAD W & WO CONTRAST  Result Date: 11/25/2020 CLINICAL DATA:  Nausea and vomiting. EXAM: CT HEAD WITHOUT AND WITH CONTRAST TECHNIQUE: Contiguous axial images were obtained from the base of the skull through the vertex without and with intravenous contrast CONTRAST:  116mL OMNIPAQUE IOHEXOL 300 MG/ML  SOLN COMPARISON:  None. FINDINGS: Brain: No evidence of acute large vascular territory infarction, hemorrhage, hydrocephalus, extra-axial collection or mass lesion/mass effect. Mild-to-moderate patchy white matter hypoattenuation, nonspecific but most likely related to chronic microvascular ischemic disease Vascular: No hyperdense vessel or unexpected calcification. Visible vessels are patent. Calcific intracranial atherosclerosis. Skull: No acute fracture Sinuses/Orbits: Opacified bilateral frontoethmoidal recesses with fluid layering in the frontal sinuses. Opacified left greater than right ethmoid  air cells with scattered mucosal thickening and near complete opacification of left anterior ethmoid air cells. Near complete opacification of the left frontal sinus with air-fluid level and frothy secretions. Milder scattered mucosal thickening of bilateral sphenoid sinuses in the right maxillary sinus. Scattered areas of internal hyperdensity. Other: No mastoid effusions. IMPRESSION: 1. No evidence of acute intracranial abnormality. 2. Mild to moderate presumed chronic microvascular ischemic disease. 3. Left ostiomeatal unit predominant extensive paranasal sinus disease, described above and suggestive of sinusitis. Scattered areas of internal hyperdensity may represent inspissated secretions and/or fungal colonization. Electronically Signed   By: Margaretha Sheffield MD   On: 11/25/2020 12:42   CT ABDOMEN PELVIS W CONTRAST  Result Date: 11/25/2020 CLINICAL DATA:  Nausea and vomiting. 40 lb  weight loss in past 6 months. EXAM: CT ABDOMEN AND PELVIS WITH CONTRAST TECHNIQUE: Multidetector CT imaging of the abdomen and pelvis was performed using the standard protocol following bolus administration of intravenous contrast. CONTRAST:  112mL OMNIPAQUE IOHEXOL 300 MG/ML  SOLN COMPARISON:  None. FINDINGS: Lower Chest: No acute findings. Hepatobiliary: No hepatic masses identified. Large gallstones are seen, largest measuring 4.5 cm. No evidence of cholecystitis or biliary ductal dilatation. Pancreas: A low-attenuation lesion is seen in the pancreatic tail which is likely cystic in nature. This measures 2.1 x 1.8 cm. No evidence of pancreatic ductal dilatation or peripancreatic inflammatory changes. Spleen: Within normal limits in size and appearance. Adrenals/Urinary Tract: No masses identified. Mild left renal parenchymal scarring noted. 44mm calculus noted in upper pole of right kidney. No evidence of ureteral calculi or hydronephrosis. Mild diffuse bladder wall thickening is noted in suspicious for cystitis.  Stomach/Bowel: No evidence of obstruction, inflammatory process or abnormal fluid collections. Normal appendix visualized. Vascular/Lymphatic: No pathologically enlarged lymph nodes. No acute vascular findings. Aortic atherosclerotic calcification noted. Reproductive:  No mass or other significant abnormality. Other:  None. Musculoskeletal: Subacute healing fractures of the left posterior and lateral 7th through 11th ribs are seen. IMPRESSION: Tiny right renal calculus. No evidence of ureteral calculi or hydronephrosis. Mild diffuse bladder wall thickening, suspicious for cystitis. Suggest correlation with urinalysis. Cholelithiasis. No radiographic evidence of cholecystitis. 2.1 cm low-attenuation lesion in pancreatic tail, likely cystic in nature. Recommend abdomen MRI without and with contrast for further characterization. Subacute healing fractures of left posterior and lateral 7th through 11th ribs. Aortic Atherosclerosis (ICD10-I70.0). Electronically Signed   By: Marlaine Hind M.D.   On: 11/25/2020 12:41    Assessment & Plan:   Tameem was seen today for hypertension.  Diagnoses and all orders for this visit:  Calculus of gallbladder without cholecystitis without obstruction -     Ambulatory referral to General Surgery  Essential hypertension, benign- His blood pressure is adequately well controlled. -     CBC with Differential/Platelet; Future -     Basic metabolic panel; Future -     Basic metabolic panel -     CBC with Differential/Platelet  Stage 3a chronic kidney disease ([place])- His renal function is normal now. -     CBC with Differential/Platelet; Future -     Basic metabolic panel; Future -     Basic metabolic panel -     CBC with Differential/Platelet  Cannabinoid hyperemesis syndrome- This may be contributing to his symptoms.  He is getting adequate symptom relief with Zofran.   I have discontinued Joseguadalupe Tackett's DULoxetine. I am also having him maintain his acetaminophen,  Vitamin D3, olmesartan, rosuvastatin, carvedilol, ondansetron, indapamide, and pantoprazole.  No orders of the defined types were placed in this encounter.    Follow-up: Return in about 6 months (around 07/01/2021).  Scarlette Calico, MD

## 2020-12-30 NOTE — Patient Instructions (Signed)

## 2020-12-31 LAB — BASIC METABOLIC PANEL
BUN: 14 mg/dL (ref 6–23)
CO2: 30 mEq/L (ref 19–32)
Calcium: 9.3 mg/dL (ref 8.4–10.5)
Chloride: 101 mEq/L (ref 96–112)
Creatinine, Ser: 0.97 mg/dL (ref 0.40–1.50)
GFR: 81.51 mL/min (ref >60.00)
Glucose, Bld: 103 mg/dL — ABNORMAL HIGH (ref 70–99)
Potassium: 3.9 mEq/L (ref 3.5–5.1)
Sodium: 141 mEq/L (ref 135–145)

## 2021-01-22 ENCOUNTER — Other Ambulatory Visit: Payer: Self-pay | Admitting: Internal Medicine

## 2021-01-22 DIAGNOSIS — E785 Hyperlipidemia, unspecified: Secondary | ICD-10-CM

## 2021-01-22 DIAGNOSIS — I10 Essential (primary) hypertension: Secondary | ICD-10-CM

## 2021-02-03 ENCOUNTER — Telehealth: Payer: Self-pay

## 2021-02-03 NOTE — Telephone Encounter (Signed)
LVM   Need to know if patient is talking protonix daily since it says in note he would do it BID for 2 week and then decrease if symptoms have improved

## 2021-02-05 MED ORDER — PANTOPRAZOLE SODIUM 40 MG PO TBEC: 40.0000 mg | DELAYED_RELEASE_TABLET | Freq: Every day | ORAL | 6 refills | Status: DC

## 2021-02-05 NOTE — Telephone Encounter (Signed)
LVM  Medication sent in to the pharmacy for daily

## 2021-02-13 NOTE — Telephone Encounter (Signed)
Inbound call from patient returning call. Let him know the Medication was sent to the pharmacy

## 2021-03-07 ENCOUNTER — Other Ambulatory Visit: Payer: Self-pay | Admitting: Internal Medicine

## 2021-03-07 DIAGNOSIS — I10 Essential (primary) hypertension: Secondary | ICD-10-CM

## 2021-03-11 ENCOUNTER — Other Ambulatory Visit: Payer: Self-pay | Admitting: Surgery

## 2021-03-20 ENCOUNTER — Other Ambulatory Visit: Payer: Self-pay | Admitting: Surgery

## 2021-03-20 NOTE — Progress Notes (Signed)
Surgical Instructions    Your procedure is scheduled on Thursday 04/10/21.   Report to [facility] Main Entrance "A" at 08:30 A.M., then check in with the Admitting office.  Call this number if you have problems the morning of surgery:  [phone]   If you have any questions prior to your surgery date call [phone]: Open Monday-Friday 8am-4pm    Remember:  Do not eat after midnight the night before your surgery  You may drink clear liquids until 05:30 A.M. the morning of your surgery.   Clear liquids allowed are: Water, Non-Citrus Juices (without pulp), Carbonated Beverages, Clear Tea, Black Coffee ONLY (NO MILK, CREAM OR POWDERED CREAMER of any kind), and Gatorade    Take these medicines the morning of surgery with A SIP OF WATER   carvedilol (COREG)  pantoprazole (PROTONIX)  rosuvastatin (CRESTOR)    Take these medicines if needed:   acetaminophen (TYLENOL)  ondansetron (ZOFRAN)     As of today, STOP taking any Aspirin (unless otherwise instructed by your surgeon) Aleve, Naproxen, Ibuprofen, Motrin, Advil, Goody's, BC's, all herbal medications, fish oil, and all vitamins.          Do not wear jewelry or makeup Do not wear lotions, powders, perfumes/colognes, or deodorant. Do not shave 48 hours prior to surgery.  Men may shave face and neck. Do not bring valuables to the hospital. DO Not wear nail polish, gel polish, artificial nails, or any other type of covering on natural nails including finger and toenails. If patients have artificial nails, gel coating, etc. that need to be removed by a nail salon please have this removed prior to surgery or surgery may need to be canceled/delayed if the surgeon/ anesthesia feels like the patient is unable to be adequately monitored.             [facility] is not responsible for any belongings or valuables.  Do NOT Smoke (Tobacco/Vaping) or drink Alcohol 24 hours prior to your procedure If you use a CPAP at night, you may bring  all equipment for your overnight stay.   Contacts, glasses, dentures or bridgework may not be worn into surgery, please bring cases for these belongings   For patients admitted to the hospital, discharge time will be determined by your treatment team.   Patients discharged the day of surgery will not be allowed to drive home, and someone needs to stay with them for 24 hours.  ONLY 1 SUPPORT PERSON MAY BE PRESENT WHILE YOU ARE IN SURGERY. IF YOU ARE TO BE ADMITTED ONCE YOU ARE IN YOUR ROOM YOU WILL BE ALLOWED TWO (2) VISITORS.  Minor children may have two parents present. Special consideration for safety and communication needs will be reviewed on a case by case basis.  Special instructions:    Oral Hygiene is also important to reduce your risk of infection.  Remember - BRUSH YOUR TEETH THE MORNING OF SURGERY WITH YOUR REGULAR TOOTHPASTE   [facility]- Preparing For Surgery  Before surgery, you can play an important role. Because skin is not sterile, your skin needs to be as free of germs as possible. You can reduce the number of germs on your skin by washing with CHG (chlorahexidine gluconate) Soap before surgery.  CHG is an antiseptic cleaner which kills germs and bonds with the skin to continue killing germs even after washing.     Please do not use if you have an allergy to CHG or antibacterial soaps. If your skin becomes reddened/irritated  stop using the CHG.  Do not shave (including legs and underarms) for at least 48 hours prior to first CHG shower. It is OK to shave your face.  Please follow these instructions carefully.     Shower the NIGHT BEFORE SURGERY and the MORNING OF SURGERY with CHG Soap.   If you chose to wash your hair, wash your hair first as usual with your normal shampoo. After you shampoo, rinse your hair and body thoroughly to remove the shampoo.  Then [employer] and genitals (private parts) with your normal soap and rinse thoroughly to remove soap.  After that Use  CHG Soap as you would any other liquid soap. You can apply CHG directly to the skin and wash gently with a scrungie or a clean washcloth.   Apply the CHG Soap to your body ONLY FROM THE NECK DOWN.  Do not use on open wounds or open sores. Avoid contact with your eyes, ears, mouth and genitals (private parts). Wash Face and genitals (private parts)  with your normal soap.   Wash thoroughly, paying special attention to the area where your surgery will be performed.  Thoroughly rinse your body with warm water from the neck down.  DO NOT shower/wash with your normal soap after using and rinsing off the CHG Soap.  Pat yourself dry with a CLEAN TOWEL.  Wear CLEAN PAJAMAS to bed the night before surgery  Place CLEAN SHEETS on your bed the night before your surgery  DO NOT SLEEP WITH PETS.   Day of Surgery:  Take a shower with CHG soap. Wear Clean/Comfortable clothing the morning of surgery Do not apply any deodorants/lotions.   Remember to brush your teeth WITH YOUR REGULAR TOOTHPASTE.   Please read over the following fact sheets that you were given.

## 2021-03-21 ENCOUNTER — Encounter (HOSPITAL_COMMUNITY): Payer: Self-pay

## 2021-03-21 ENCOUNTER — Ambulatory Visit (HOSPITAL_COMMUNITY)
Admission: RE | Admit: 2021-03-21 | Discharge: 2021-03-21 | Disposition: A | Discharge status: Home / Self Care | Payer: Medicare Other | Source: Ambulatory Visit | Attending: Surgery | Admitting: Surgery

## 2021-03-21 ENCOUNTER — Other Ambulatory Visit: Payer: Self-pay

## 2021-03-21 ENCOUNTER — Encounter: Payer: Self-pay | Admitting: Internal Medicine

## 2021-03-21 DIAGNOSIS — Z01812 Encounter for preprocedural laboratory examination: Secondary | ICD-10-CM | POA: Diagnosis present

## 2021-03-21 DIAGNOSIS — I712 Thoracic aortic aneurysm, without rupture: Secondary | ICD-10-CM | POA: Insufficient documentation

## 2021-03-21 DIAGNOSIS — I517 Cardiomegaly: Secondary | ICD-10-CM | POA: Insufficient documentation

## 2021-03-21 DIAGNOSIS — K8020 Calculus of gallbladder without cholecystitis without obstruction: Secondary | ICD-10-CM | POA: Insufficient documentation

## 2021-03-21 DIAGNOSIS — I4892 Unspecified atrial flutter: Secondary | ICD-10-CM | POA: Insufficient documentation

## 2021-03-21 DIAGNOSIS — G4733 Obstructive sleep apnea (adult) (pediatric): Secondary | ICD-10-CM | POA: Diagnosis not present

## 2021-03-21 DIAGNOSIS — J984 Other disorders of lung: Secondary | ICD-10-CM | POA: Insufficient documentation

## 2021-03-21 HISTORY — DX: Personal history of urinary calculi: Z87.442

## 2021-03-21 LAB — CBC
HCT: 44.3 % (ref 39.0–52.0)
Hemoglobin: 15.4 g/dL (ref 13.0–17.0)
MCH: 33.6 pg (ref 26.0–34.0)
MCHC: 34.8 g/dL (ref 30.0–36.0)
MCV: 96.7 fL (ref 80.0–100.0)
Platelets: 214 10*3/uL (ref 150–400)
RBC: 4.58 MIL/uL (ref 4.22–5.81)
RDW: 12.4 % (ref 11.5–15.5)
WBC: 6.8 10*3/uL (ref 4.0–10.5)
nRBC: 0 % (ref 0.0–0.2)

## 2021-03-21 LAB — BASIC METABOLIC PANEL
Anion gap: 11 (ref 5–15)
BUN: 14 mg/dL (ref 8–23)
CO2: 26 mmol/L (ref 22–32)
Calcium: 9.6 mg/dL (ref 8.9–10.3)
Chloride: 102 mmol/L (ref 98–111)
Creatinine, Ser: 0.91 mg/dL (ref 0.61–1.24)
GFR, Estimated: >60 mL/min (ref >60)
Glucose, Bld: 107 mg/dL — ABNORMAL HIGH (ref 70–99)
Potassium: 3.8 mmol/L (ref 3.5–5.1)
Sodium: 139 mmol/L (ref 135–145)

## 2021-03-21 NOTE — Progress Notes (Signed)
At time of PAT visit, patient had (2) charts that needed to be merged together.  2nd MRN [id number] Once charts are merged, able to see full history   PCP - [facility] Cardiologist - Dr. Marlou Porch  Chest x-ray - n/a EKG - 06/10/20 ECHO - 2019  SA - yes, does not wear CPAP   COVID TEST- n/a (ambulatory sx)   Anesthesia review: yes, heart history  Patient denies shortness of breath, fever, cough and chest pain at PAT appointment   All instructions explained to the patient, with a verbal understanding of the material. Patient agrees to go over the instructions while at home for a better understanding. Patient also instructed to self quarantine after being tested for COVID-19. The opportunity to ask questions was provided.

## 2021-03-23 ENCOUNTER — Other Ambulatory Visit: Payer: Self-pay | Admitting: Surgery

## 2021-03-24 ENCOUNTER — Other Ambulatory Visit: Payer: Self-pay | Admitting: Surgery

## 2021-03-25 NOTE — Anesthesia Preprocedure Evaluation (Addendum)
Anesthesia Evaluation  Patient identified by MRN, date of birth, ID band Patient awake    Reviewed: Allergy & Precautions, NPO status , Patient's Chart, lab work & pertinent test results, reviewed documented beta blocker date and time   Airway Mallampati: I       Dental no notable dental hx.    Pulmonary sleep apnea and Continuous Positive Airway Pressure Ventilation , Patient abstained from smoking., former smoker,    Pulmonary exam normal        Cardiovascular hypertension, Pt. on home beta blockers Normal cardiovascular exam     Neuro/Psych PSYCHIATRIC DISORDERS Depression    GI/Hepatic Neg liver ROS, GERD  Medicated and Controlled,  Endo/Other  negative endocrine ROS  Renal/GU negative Renal ROS  negative genitourinary   Musculoskeletal  (+) Arthritis , Osteoarthritis,    Abdominal (+) + obese,   Peds  Hematology   Anesthesia Other Findings   Reproductive/Obstetrics                            Anesthesia Physical Anesthesia Plan  ASA: 2  Anesthesia Plan: General   Post-op Pain Management:    Induction: Intravenous  PONV Risk Score and Plan: 4 or greater and Ondansetron, Dexamethasone and Midazolam  Airway Management Planned: Oral ETT  Additional Equipment: None  Intra-op Plan:   Post-operative Plan: Extubation in OR  Informed Consent: I have reviewed the patients History and Physical, chart, labs and discussed the procedure including the risks, benefits and alternatives for the proposed anesthesia with the patient or authorized representative who has indicated his/her understanding and acceptance.     Dental advisory given  Plan Discussed with: CRNA  Anesthesia Plan Comments: (PAT note by Karoline Caldwell, PA-C: [facility] for history of a flutter status post ablation 2019, dilated aortic root 4.8 cm, OSA not on CPAP.  Last seen by Dr. Marlou Porch 06/27/2020.  Per note,  patient has been maintaining sinus rhythm since ablation, off Xarelto.  Updated CT scan ordered to follow dilated arctic root.  Scan done 07/17/2020 showed stable dilated ascending aorta, 4.9 cm.  Recommend repeat in 1 year.  Preop labs reviewed, unremarkable.  EKG 06/10/2020: Sinus rhythm with first-degree AV block.  Rate 65.  Incomplete right bundle branch block.  CTA 07/17/2020: IMPRESSION: 1. 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 Negative for an aortic dissection. Ascending thoracic aortic aneurysm. Recommend semi-annual imaging followup by CTA or MRA and referral to cardiothoracic surgery if not already obtained. This recommendation follows 2010 ACCF/AHA/AATS/ACR/ASA/SCA/SCAI/SIR/STS/SVM Guidelines for the Diagnosis and Management of Patients With Thoracic Aortic Disease. Circulation. 2010; 121JN:9224643. Aortic aneurysm NOS (ICD10-I71.9) 2. Small amount of material in the trachea probably represents mucus. In addition, there are subtle dependent densities in the right lower lobe. These dependent densities were not present on the previous examination and could represent atelectasis. Small foci of aspiration cannot be excluded. Recommend attention to these dependent densities on follow up imaging. No other significant parenchymal airspace disease in the lungs. 3. Cholelithiasis.  TTE 04/23/2018: - Left ventricle: The cavity size was normal. Wall thickness was  increased in a pattern of mild LVH. Systolic function was normal.  The estimated ejection fraction was in the range of 55% to 60%.  Wall motion was normal; there were no regional wall motion  abnormalities. The study is not technically sufficient to allow  evaluation of LV diastolic function.  - Aortic valve: There was  mild regurgitation.  - Aortic root: The aortic root was mildly dilated.   Impressions:   - Normal LV function; mild LVH; mildly dilated aortic root  (3.8 cm)  and ascending aorta (4.5 cm); mild AI; suggest CTA or MRA to  further assess aorta.   Nuclear stress 12/06/2017: . Nuclear stress EF: 54%. Marland Kitchen No T wave inversion was noted during stress. . There was no ST segment deviation noted during stress. . This is a low risk study.  Normal perfusion. LVEF 54% with normal wall motion. This is a low risk study )       Anesthesia Quick Evaluation

## 2021-03-25 NOTE — Progress Notes (Signed)
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  Last seen by Dr. Marlou Porch 06/27/2020.  Per note, patient has been maintaining sinus rhythm since ablation, off Xarelto.  Updated CT scan ordered to follow dilated arctic root.  Scan done 07/17/2020 showed stable dilated ascending aorta, 4.9 cm.  Recommend repeat in 1 year.  Preop labs reviewed, unremarkable.  EKG 06/10/2020: Sinus rhythm with first-degree AV block.  Rate 65.  Incomplete right bundle branch block.  CTA 07/17/2020: IMPRESSION: 1. 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 Negative for an aortic dissection. Ascending thoracic aortic aneurysm. Recommend semi-annual imaging followup by CTA or MRA and referral to cardiothoracic surgery if not already obtained. This recommendation follows 2010 ACCF/AHA/AATS/ACR/ASA/SCA/SCAI/SIR/STS/SVM Guidelines for the Diagnosis and Management of Patients With Thoracic Aortic Disease. Circulation. 2010; [id number]:9224643. Aortic aneurysm NOS (ICD10-I71.9) 2. Small amount of material in the trachea probably represents mucus. In addition, there are subtle dependent densities in the right lower lobe. These dependent densities were not present on the previous examination and could represent atelectasis. Small foci of aspiration cannot be excluded. Recommend attention to these dependent densities on follow up imaging. No other significant parenchymal airspace disease in the lungs. 3. Cholelithiasis.  TTE 04/23/2018: - Left ventricle: The cavity size was normal. Wall thickness was    increased in a pattern of mild LVH. Systolic function was normal.    The estimated ejection fraction was in the range of 55% to 60%.    Wall motion was normal; there were no regional wall motion    abnormalities. The study is not technically sufficient to allow    evaluation of LV diastolic  function.  - Aortic valve: There was mild regurgitation.  - Aortic root: The aortic root was mildly dilated.   Impressions:   - Normal LV function; mild LVH; mildly dilated aortic root (3.8 cm)    and ascending aorta (4.5 cm); mild AI; suggest CTA or MRA to    further assess aorta.    Nuclear stress 12/06/2017: Nuclear stress EF: 54%. No T wave inversion was noted during stress. There was no ST segment deviation noted during stress. This is a low risk study.   Normal perfusion. LVEF 54% with normal wall motion. This is a low risk study   Wynonia Musty [facility] Short Stay Center/Anesthesiology Phone [phone] 03/25/2021 4:36 PM

## 2021-04-09 NOTE — H&P (Addendum)
REFERRING PHYSICIAN: Janith Lima, MD  PROVIDER: Beverlee Nims, MD  MRN: [id number] DOB: 08-21-1955   Subjective   Chief Complaint: Gallbladder    History of Present Illness: Henry Figueroa is a 65 y.o. male who is seen today as an office consultation at the request of Dr. Ronnald Ramp for evaluation of gallstones  This is a very pleasant gentleman who was referred here for evaluation of his gallstones. He actually had significant episodes of recurrent nausea and vomiting with mild loss of appetite and weight loss. He underwent an upper endoscopy showing ulcers. As part of his work-up he also had a CAT scan of his abdomen pelvis earlier this year showing calcifications of the gallbladder as well as large stones and one measuring at least 4.5 cm. He had a small benign-appearing cyst in the tail the pancreas. There were no findings of any just malignancy of the pancreas or gallbladder. He has been on medication for his ulcer disease and his symptoms have resolved. On extensive questioning he does not seem to have any symptoms currently of biliary colic. It is still difficult to tell whether the ulcer disease symptoms may have been from the gallbladder. Regardless, he currently denies any abdominal pain. He is otherwise without complaints.  Review of Systems: A complete review of systems was obtained from the patient. I have reviewed this information and discussed as appropriate with the patient. See HPI as well for other ROS.  ROS   Medical History: History reviewed. No pertinent past medical history.  Patient Active Problem List  Diagnosis   Obstructive sleep apnea (adult) (pediatric)   Atrial flutter (CMS-HCC)   Basal cell carcinoma of back   Benign prostatic hyperplasia without lower urinary tract symptoms   Calculus of gallbladder without cholecystitis without obstruction   Cannabinoid hyperemesis syndrome   Current severe episode of major depressive disorder without psychotic  features without prior episode (CMS-HCC)   Essential hypertension, benign   GERD with apnea   Herniated lumbar disc without myelopathy   Hyperlipidemia with target LDL less than 160   Primary hypertriglyceridemia   Primary osteoarthritis of both knees   Routine general medical examination at a health care facility   Seasonal allergic rhinitis due to pollen   Sensory peripheral neuropathy   Trochanteric bursitis of left hip   Vitamin D deficiency   Past Surgical History:  Procedure Laterality Date   Back Surgery 2019    No Known Allergies  Current Outpatient Medications on File Prior to Visit  Medication Sig Dispense Refill   cholecalciferol (VITAMIN D3) 1,250 mcg (50,000 unit) capsule TAKE 1 CAPSULE BY MOUTH EVERY SUNDAY   carvediloL (COREG) 12.5 MG tablet TAKE 1 TABLET BY MOUTH TWICE A DAY WITH A MEAL   DULoxetine (CYMBALTA) 30 MG DR capsule Take 30 mg by mouth once daily   indapamide (LOZOL) 2.5 MG tablet   olmesartan (BENICAR) 40 MG tablet Take 40 mg by mouth once daily   ondansetron (ZOFRAN) 4 MG tablet as directed   pantoprazole (PROTONIX) 40 MG DR tablet Take 40 mg by mouth 2 (two) times daily   rosuvastatin (CRESTOR) 10 MG tablet Take 10 mg by mouth once daily   No current facility-administered medications on file prior to visit.   History reviewed. No pertinent family history.   Social History   Tobacco Use  Smoking Status Smoker, Current Status Unknown   Packs/day: 0.50  Smokeless Tobacco Never Used    Social History   Socioeconomic History  Marital status: Unknown  Tobacco Use   Smoking status: Smoker, Current Status Unknown  Packs/day: 0.50   Smokeless tobacco: Never Used  Substance and Sexual Activity   Alcohol use: Not Currently   Drug use: Yes  Comment: THC   Objective:   Vitals:  03/11/21 1458  BP: (!) 162/80  Pulse: 90  Temp: 36.9 C (98.4 F)  SpO2: 95%  Weight: (!) 109.6 kg (241 lb 9.6 oz)  Height: 182.9 cm (6')   Body mass index  is 32.77 kg/m.  Physical Exam   He appears well on exam  His abdomen is soft and nontender. There are no hernias. There is no organomegaly. No masses  Lungs clear  CV RRR  Neuro grossly intact   Labs, Imaging and Diagnostic Testing: I have reviewed his CAT scan of the abdomen pelvis as well as his previous ultrasound.  Assessment and Plan:  Diagnoses and all orders for this visit:  Symptomatic cholelithiasis - [facility] Case Posting Request; Future    At this point a long discussion with the patient regarding gallstones and symptomatic cholelithiasis. 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 I gave him literature regarding gallbladder surgery and we discussed this in detail. After long discussion he does wish to proceed with a laparoscopic cholecystectomy. I discussed the surgical procedure in detail. We discussed the risk which includes but is not limited to bleeding, infection, injury to surrounding structures, the need to convert to an open procedure, bile duct injury, bile leak, the need for other procedures, cardiopulmonary issues, postoperative recovery, etc. He understands and surgery will be scheduled.

## 2021-04-10 ENCOUNTER — Ambulatory Visit (HOSPITAL_COMMUNITY)
Admission: RE | Admit: 2021-04-10 | Discharge: 2021-04-10 | Disposition: A | Discharge status: Home / Self Care | Payer: Medicare Other | Source: Ambulatory Visit | Attending: Surgery | Admitting: Surgery

## 2021-04-10 ENCOUNTER — Ambulatory Visit (HOSPITAL_COMMUNITY): Payer: Medicare Other | Admitting: Physician Assistant

## 2021-04-10 ENCOUNTER — Other Ambulatory Visit: Payer: Self-pay

## 2021-04-10 ENCOUNTER — Encounter (HOSPITAL_COMMUNITY): Payer: Self-pay | Admitting: Surgery

## 2021-04-10 ENCOUNTER — Ambulatory Visit (HOSPITAL_COMMUNITY): Payer: Medicare Other | Admitting: Certified Registered Nurse Anesthetist

## 2021-04-10 ENCOUNTER — Encounter (HOSPITAL_COMMUNITY)
Admission: RE | Disposition: A | Discharge status: Home / Self Care | Payer: Self-pay | Source: Ambulatory Visit | Attending: Surgery

## 2021-04-10 DIAGNOSIS — K8010 Calculus of gallbladder with chronic cholecystitis without obstruction: Secondary | ICD-10-CM | POA: Diagnosis not present

## 2021-04-10 DIAGNOSIS — Z85828 Personal history of other malignant neoplasm of skin: Secondary | ICD-10-CM | POA: Diagnosis not present

## 2021-04-10 DIAGNOSIS — K8020 Calculus of gallbladder without cholecystitis without obstruction: Secondary | ICD-10-CM | POA: Diagnosis present

## 2021-04-10 DIAGNOSIS — Z79899 Other long term (current) drug therapy: Secondary | ICD-10-CM | POA: Diagnosis not present

## 2021-04-10 HISTORY — PX: CHOLECYSTECTOMY: SHX55

## 2021-04-10 SURGERY — LAPAROSCOPIC CHOLECYSTECTOMY
Anesthesia: General

## 2021-04-10 MED ORDER — CHLORHEXIDINE GLUCONATE 0.12 % MT SOLN
15.0000 mL | Freq: Once | OROMUCOSAL | Status: AC
  Administered 2021-04-10: 15 mL via OROMUCOSAL
  Filled 2021-04-10: qty 15

## 2021-04-10 MED ORDER — ALBUMIN HUMAN 5 % IV SOLN: INTRAVENOUS | Status: DC | PRN

## 2021-04-10 MED ORDER — FENTANYL CITRATE (PF) 250 MCG/5ML IJ SOLN
INTRAMUSCULAR | Status: AC
  Filled 2021-04-10: qty 5

## 2021-04-10 MED ORDER — MIDAZOLAM HCL 2 MG/2ML IJ SOLN
INTRAMUSCULAR | Status: AC
  Filled 2021-04-10: qty 2

## 2021-04-10 MED ORDER — OXYCODONE HCL 5 MG PO TABS
5.0000 mg | ORAL_TABLET | Freq: Once | ORAL | Status: AC
  Administered 2021-04-10: 5 mg via ORAL

## 2021-04-10 MED ORDER — ACETAMINOPHEN 500 MG PO TABS
1000.0000 mg | ORAL_TABLET | ORAL | Status: AC
Start: 2021-04-10 — End: 2021-04-10
  Administered 2021-04-10: 1000 mg via ORAL
  Filled 2021-04-10: qty 2

## 2021-04-10 MED ORDER — OXYCODONE HCL 5 MG PO TABS: 5.0000 mg | ORAL_TABLET | Freq: Four times a day (QID) | ORAL | 0 refills | Status: DC | PRN

## 2021-04-10 MED ORDER — CEFAZOLIN SODIUM-DEXTROSE 2-4 GM/100ML-% IV SOLN
2.0000 g | INTRAVENOUS | Status: AC
  Administered 2021-04-10: 2 g via INTRAVENOUS
  Filled 2021-04-10: qty 100

## 2021-04-10 MED ORDER — PROPOFOL 10 MG/ML IV BOLUS
INTRAVENOUS | Status: DC | PRN
  Administered 2021-04-10: 30 mg via INTRAVENOUS
  Administered 2021-04-10: 200 mg via INTRAVENOUS

## 2021-04-10 MED ORDER — ONDANSETRON HCL 4 MG/2ML IJ SOLN
INTRAMUSCULAR | Status: DC | PRN
  Administered 2021-04-10: 4 mg via INTRAVENOUS

## 2021-04-10 MED ORDER — BUPIVACAINE-EPINEPHRINE (PF) 0.25% -1:200000 IJ SOLN
INTRAMUSCULAR | Status: AC
  Filled 2021-04-10: qty 30

## 2021-04-10 MED ORDER — MIDAZOLAM HCL 2 MG/2ML IJ SOLN: 1.0000 mg | Freq: Once | INTRAMUSCULAR | Status: AC

## 2021-04-10 MED ORDER — HEMOSTATIC AGENTS (NO CHARGE) OPTIME
TOPICAL | Status: DC | PRN
Start: 2021-04-10 — End: 2021-04-10
  Administered 2021-04-10: 1 via TOPICAL

## 2021-04-10 MED ORDER — PHENYLEPHRINE HCL-NACL 20-0.9 MG/250ML-% IV SOLN
INTRAVENOUS | Status: DC | PRN
  Administered 2021-04-10: 50 ug/min via INTRAVENOUS

## 2021-04-10 MED ORDER — SODIUM CHLORIDE 0.9 % IR SOLN
Status: DC | PRN
  Administered 2021-04-10: 1000 mL

## 2021-04-10 MED ORDER — ONDANSETRON HCL 4 MG/2ML IJ SOLN
INTRAMUSCULAR | Status: AC
  Filled 2021-04-10: qty 2

## 2021-04-10 MED ORDER — ENSURE PRE-SURGERY PO LIQD: 296.0000 mL | Freq: Once | ORAL | Status: DC

## 2021-04-10 MED ORDER — LIDOCAINE 2% (20 MG/ML) 5 ML SYRINGE
INTRAMUSCULAR | Status: DC | PRN
  Administered 2021-04-10: 100 mg via INTRAVENOUS

## 2021-04-10 MED ORDER — SUGAMMADEX SODIUM 500 MG/5ML IV SOLN
INTRAVENOUS | Status: DC | PRN
  Administered 2021-04-10: 400 mg via INTRAVENOUS

## 2021-04-10 MED ORDER — CHLORHEXIDINE GLUCONATE CLOTH 2 % EX PADS: 6.0000 | MEDICATED_PAD | Freq: Once | CUTANEOUS | Status: DC

## 2021-04-10 MED ORDER — ROCURONIUM BROMIDE 10 MG/ML (PF) SYRINGE
PREFILLED_SYRINGE | INTRAVENOUS | Status: DC | PRN
  Administered 2021-04-10: 70 mg via INTRAVENOUS

## 2021-04-10 MED ORDER — FENTANYL CITRATE (PF) 250 MCG/5ML IJ SOLN
INTRAMUSCULAR | Status: DC | PRN
  Administered 2021-04-10: 150 ug via INTRAVENOUS
  Administered 2021-04-10 (×2): 50 ug via INTRAVENOUS

## 2021-04-10 MED ORDER — PHENYLEPHRINE 40 MCG/ML (10ML) SYRINGE FOR IV PUSH (FOR BLOOD PRESSURE SUPPORT)
PREFILLED_SYRINGE | INTRAVENOUS | Status: DC | PRN
Start: 2021-04-10 — End: 2021-04-10
  Administered 2021-04-10 (×3): 80 ug via INTRAVENOUS
  Administered 2021-04-10: 120 ug via INTRAVENOUS

## 2021-04-10 MED ORDER — MIDAZOLAM HCL 2 MG/2ML IJ SOLN
INTRAMUSCULAR | Status: AC
  Administered 2021-04-10: 1 mg via INTRAVENOUS
  Filled 2021-04-10: qty 2

## 2021-04-10 MED ORDER — EPHEDRINE SULFATE-NACL 50-0.9 MG/10ML-% IV SOSY
PREFILLED_SYRINGE | INTRAVENOUS | Status: DC | PRN
  Administered 2021-04-10 (×2): 5 mg via INTRAVENOUS

## 2021-04-10 MED ORDER — 0.9 % SODIUM CHLORIDE (POUR BTL) OPTIME
TOPICAL | Status: DC | PRN
  Administered 2021-04-10: 1000 mL

## 2021-04-10 MED ORDER — LIDOCAINE 2% (20 MG/ML) 5 ML SYRINGE
INTRAMUSCULAR | Status: AC
  Filled 2021-04-10: qty 5

## 2021-04-10 MED ORDER — LACTATED RINGERS IV SOLN: INTRAVENOUS | Status: DC

## 2021-04-10 MED ORDER — DEXAMETHASONE SODIUM PHOSPHATE 10 MG/ML IJ SOLN
INTRAMUSCULAR | Status: AC
  Filled 2021-04-10: qty 1

## 2021-04-10 MED ORDER — MIDAZOLAM HCL 5 MG/5ML IJ SOLN
INTRAMUSCULAR | Status: DC | PRN
  Administered 2021-04-10: 1 mg via INTRAVENOUS

## 2021-04-10 MED ORDER — DEXAMETHASONE SODIUM PHOSPHATE 10 MG/ML IJ SOLN
INTRAMUSCULAR | Status: DC | PRN
  Administered 2021-04-10: 5 mg via INTRAVENOUS

## 2021-04-10 MED ORDER — ORAL CARE MOUTH RINSE: 15.0000 mL | Freq: Once | OROMUCOSAL | Status: AC

## 2021-04-10 MED ORDER — BUPIVACAINE-EPINEPHRINE 0.25% -1:200000 IJ SOLN
INTRAMUSCULAR | Status: DC | PRN
  Administered 2021-04-10: 20 mL

## 2021-04-10 MED ORDER — PROPOFOL 10 MG/ML IV BOLUS
INTRAVENOUS | Status: AC
  Filled 2021-04-10: qty 20

## 2021-04-10 MED ORDER — ROCURONIUM BROMIDE 10 MG/ML (PF) SYRINGE
PREFILLED_SYRINGE | INTRAVENOUS | Status: AC
  Filled 2021-04-10: qty 10

## 2021-04-10 MED ORDER — SUCCINYLCHOLINE CHLORIDE 200 MG/10ML IV SOSY
PREFILLED_SYRINGE | INTRAVENOUS | Status: DC | PRN
  Administered 2021-04-10: 200 mg via INTRAVENOUS

## 2021-04-10 SURGICAL SUPPLY — 36 items
APPLIER CLIP 5 13 M/L LIGAMAX5 (MISCELLANEOUS) ×2
BAG COUNTER SPONGE SURGICOUNT (BAG) ×2 IMPLANT
CANISTER SUCT 3000ML PPV (MISCELLANEOUS) ×2 IMPLANT
CHLORAPREP W/TINT 26 (MISCELLANEOUS) ×2 IMPLANT
CLIP APPLIE 5 13 M/L LIGAMAX5 (MISCELLANEOUS) ×1 IMPLANT
COVER SURGICAL LIGHT HANDLE (MISCELLANEOUS) ×2 IMPLANT
DERMABOND ADVANCED (GAUZE/BANDAGES/DRESSINGS) ×1
DERMABOND ADVANCED .7 DNX12 (GAUZE/BANDAGES/DRESSINGS) ×1 IMPLANT
ELECT REM PT RETURN 9FT ADLT (ELECTROSURGICAL) ×2
ELECTRODE REM PT RTRN 9FT ADLT (ELECTROSURGICAL) ×1 IMPLANT
GLOVE SURG SIGNA 7.5 PF LTX (GLOVE) ×2 IMPLANT
GOWN STRL REUS W/ TWL LRG LVL3 (GOWN DISPOSABLE) ×2 IMPLANT
GOWN STRL REUS W/ TWL XL LVL3 (GOWN DISPOSABLE) ×1 IMPLANT
GOWN STRL REUS W/TWL LRG LVL3 (GOWN DISPOSABLE) ×2
GOWN STRL REUS W/TWL XL LVL3 (GOWN DISPOSABLE) ×1
HEMOSTAT SNOW SURGICEL 2X4 (HEMOSTASIS) ×2 IMPLANT
KIT BASIN OR (CUSTOM PROCEDURE TRAY) ×2 IMPLANT
KIT TURNOVER KIT B (KITS) ×2 IMPLANT
NS IRRIG 1000ML POUR BTL (IV SOLUTION) ×2 IMPLANT
PAD ARMBOARD 7.5X6 YLW CONV (MISCELLANEOUS) ×2 IMPLANT
POUCH RETRIEVAL ECOSAC 10 (ENDOMECHANICALS) ×1 IMPLANT
POUCH RETRIEVAL ECOSAC 10MM (ENDOMECHANICALS) ×1
POUCH SPECIMEN RETRIEVAL 10MM (ENDOMECHANICALS) ×2 IMPLANT
SCISSORS LAP 5X35 DISP (ENDOMECHANICALS) ×2 IMPLANT
SET IRRIG TUBING LAPAROSCOPIC (IRRIGATION / IRRIGATOR) ×2 IMPLANT
SET TUBE SMOKE EVAC HIGH FLOW (TUBING) ×2 IMPLANT
SLEEVE ENDOPATH XCEL 5M (ENDOMECHANICALS) ×4 IMPLANT
SPECIMEN JAR SMALL (MISCELLANEOUS) ×2 IMPLANT
SUT MNCRL AB 4-0 PS2 18 (SUTURE) ×2 IMPLANT
SUT VICRYL 0 UR6 27IN ABS (SUTURE) ×4 IMPLANT
TOWEL GREEN STERILE (TOWEL DISPOSABLE) ×2 IMPLANT
TOWEL GREEN STERILE FF (TOWEL DISPOSABLE) ×2 IMPLANT
TRAY LAPAROSCOPIC MC (CUSTOM PROCEDURE TRAY) ×2 IMPLANT
TROCAR XCEL BLUNT TIP 100MML (ENDOMECHANICALS) ×2 IMPLANT
TROCAR XCEL NON-BLD 5MMX100MML (ENDOMECHANICALS) ×2 IMPLANT
WATER STERILE IRR 1000ML POUR (IV SOLUTION) ×2 IMPLANT

## 2021-04-10 NOTE — Transfer of Care (Signed)
Immediate Anesthesia Transfer of Care Note  Patient: Henry Figueroa  Procedure(s) Performed: LAPAROSCOPIC CHOLECYSTECTOMY  Patient Location: PACU  Anesthesia Type:General  Level of Consciousness: awake, alert  and oriented  Airway & Oxygen Therapy: Patient Spontanous Breathing and Patient connected to face mask oxygen  Post-op Assessment: Report given to RN and Post -op Vital signs reviewed and stable  Post vital signs: Reviewed and stable  Last Vitals:  Vitals Value Taken Time  BP 159/93 04/10/21 1120  Temp    Pulse 78 04/10/21 1121  Resp 10 04/10/21 1121  SpO2 93 % 04/10/21 1121  Vitals shown include unvalidated device data.  Last Pain:  Vitals:   04/10/21 0854  TempSrc: Oral      Patients Stated Pain Goal: 0 ([id number])  Complications: No notable events documented.

## 2021-04-10 NOTE — Discharge Instructions (Signed)
[facility] ______[facility] LAPAROSCOPIC SURGERY: POST OP INSTRUCTIONS Always review your discharge instruction sheet given to you by the facility where your surgery was performed. IF YOU HAVE DISABILITY OR FAMILY LEAVE FORMS, YOU MUST BRING THEM TO THE OFFICE FOR PROCESSING.   DO NOT GIVE THEM TO YOUR DOCTOR.  A prescription for pain medication may be given to you upon discharge.  Take your pain medication as prescribed, if needed.  If narcotic pain medicine is not needed, then you may take acetaminophen (Tylenol) or ibuprofen (Advil) as needed. Take your usually prescribed medications unless otherwise directed. If you need a refill on your pain medication, please contact your pharmacy.  They will contact our office to request authorization. Prescriptions will not be filled after 5pm or on week-ends. You should follow a light diet the first few days after arrival home, such as soup and crackers, etc.  Be sure to include lots of fluids daily. Most patients will experience some swelling and bruising in the area of the incisions.  Ice packs will help.  Swelling and bruising can take several days to resolve.  It is common to experience some constipation if taking pain medication after surgery.  Increasing fluid intake and taking a stool softener (such as Colace) will usually help or prevent this problem from occurring.  A mild laxative (Milk of Magnesia or Miralax) should be taken according to package instructions if there are no bowel movements after 48 hours. Unless discharge instructions indicate otherwise, you may remove your bandages 24-48 hours after surgery, and you may shower at that time.  You may have steri-strips (small skin tapes) in place directly over the incision.  These strips should be left on the skin for 7-10 days.  If your surgeon used skin glue on the incision, you may shower in 24 hours.  The glue will flake off over the next 2-3 weeks.  Any sutures or staples will be  removed at the office during your follow-up visit. ACTIVITIES:  You may resume regular (light) daily activities beginning the next day--such as daily self-care, walking, climbing stairs--gradually increasing activities as tolerated.  You may have sexual intercourse when it is comfortable.  Refrain from any heavy lifting or straining until approved by your doctor. You may drive when you are no longer taking prescription pain medication, you can comfortably wear a seatbelt, and you can safely maneuver your car and apply brakes. RETURN TO WORK:  __________________________________________________________ Dennis Bast should see your doctor in the office for a follow-up appointment approximately 2-3 weeks after your surgery.  Make sure that you call for this appointment within a day or two after you arrive home to insure a convenient appointment time. OTHER INSTRUCTIONS: ok to shower starting tomorrow Ice pack, tylenol, and ibuprofen also for pain No lifting more than 15 pounds for 3 weeks __________________________________________________________________________________________________________________________ __________________________________________________________________________________________________________________________ WHEN TO CALL YOUR DOCTOR: Fever over 101.0 Inability to urinate Continued bleeding from incision. Increased pain, redness, or drainage from the incision. Increasing abdominal pain  The clinic staff is available to answer your questions during regular business hours.  Please don't hesitate to call and ask to speak to one of the nurses for clinical concerns.  If you have a medical emergency, go to the nearest emergency room or call 911.  A surgeon from [facility] is always on call at the hospital. [address], [place], [place], [place]  [address] ? [address], [place],    [address] [phone] ? [phone] ?  [phone] °Web site:  [email or web address]  ° ° °

## 2021-04-10 NOTE — Op Note (Signed)
Laparoscopic Cholecystectomy Procedure Note  Indications: This patient presents with symptomatic gallbladder disease and will undergo laparoscopic cholecystectomy.  Pre-operative Diagnosis: Symptomatic cholelithiasis  Post-operative Diagnosis: Same  Surgeon: Coralie Keens   Assistants: Autumn Messing, MD Assistant was necessary secondary to the complex nature of this difficult cholecystectomy with a very large gallbladder containing multiple gallstones greater than 4 cm  Anesthesia: General endotracheal anesthesia  ASA Class: 2  Procedure Details  The patient was seen again in the Holding Room. The risks, benefits, complications, treatment options, and expected outcomes were discussed with the patient. The possibilities of reaction to medication, pulmonary aspiration, perforation of viscus, bleeding, recurrent infection, finding a normal gallbladder, the need for additional procedures, failure to diagnose a condition, the possible need to convert to an open procedure, and creating a complication requiring transfusion or operation were discussed with the patient. The likelihood of improving the patient's symptoms with return to their baseline status is good.  The patient and/or family concurred with the proposed plan, giving informed consent. The site of surgery properly noted. The patient was taken to Operating Room, identified as Henry Figueroa and the procedure verified as Laparoscopic Cholecystectomy with Intraoperative Cholangiogram. A Time Out was held and the above information confirmed.  Prior to the induction of general anesthesia, antibiotic prophylaxis was administered. General endotracheal anesthesia was then administered and tolerated well. After the induction, the abdomen was prepped with Chloraprep and draped in sterile fashion. The patient was positioned in the supine position.  Local anesthetic agent was injected into the skin near the umbilicus and an incision made.  we  dissected down to the abdominal fascia with blunt dissection.  The fascia was incised vertically and a piece of mesh was found in the abdominal wall.  I made an incision through the mesh.  And we entered the peritoneal cavity bluntly.  A pursestring suture of 0-Vicryl was placed around the fascial opening.  The Hasson cannula was inserted and secured with the stay suture.  Pneumoperitoneum was then created with CO2 and tolerated well without any adverse changes in the patient's vital signs. A 5-mm port was placed in the subxiphoid position.  Two 5-mm ports were placed in the right upper quadrant. All skin incisions were infiltrated with a local anesthetic agent before making the incision and placing the trocars.   We positioned the patient in reverse Trendelenburg, tilted slightly to the patient's left.  The gallbladder was identified, the fundus grasped and retracted cephalad.  The gallbladder was very distended and contained multiple large gallstones.  Adhesions were lysed bluntly and with the electrocautery where indicated, taking care not to injure any adjacent organs or viscus. The infundibulum was grasped and retracted laterally, exposing the peritoneum overlying the triangle of Calot. This was then divided and exposed in a blunt fashion. The cystic duct was clearly identified and bluntly dissected circumferentially. A critical view of the cystic duct and cystic artery was obtained.  The cystic duct was then ligated with clips and divided. The cystic artery was, dissected free, ligated with clips and divided as well.   The gallbladder was dissected from the liver bed in retrograde fashion with the electrocautery. The gallbladder was removed and placed in an EKOS sac sac. The liver bed was irrigated and inspected. Hemostasis was achieved with the electrocautery. Copious irrigation was utilized and was repeatedly aspirated until clear.  The gallbladder and Endocatch sac were then removed through the  umbilical port site.  I had to make the fascial incision  much larger to remove the gallbladder in its entirety given the large stones.  The pursestring suture was used to close the umbilical fascia.  Another figure-of-eight 0 Vicryl suture in a single interrupted 0 Vicryl suture were placed at the umbilical incision as well.  We again inspected the right upper quadrant for hemostasis.  Pneumoperitoneum was released as we removed the trocars.  4-0 Monocryl was used to close the skin.   Dermabond was then applied.  The patient was then extubated and brought to the recovery room in stable condition. Instrument, sponge, and needle counts were correct at closure and at the conclusion of the case.   Findings: Chronic cholecystitis with Cholelithiasis.  This was a large, distended gallbladder with multiple large gallstones with the greatest measuring greater than 4-1/2 cm  Estimated Blood Loss: Minimal         Drains: 0         Specimens: Gallbladder           Complications: None; patient tolerated the procedure well.         Disposition: PACU - hemodynamically stable.         Condition: stable

## 2021-04-10 NOTE — Anesthesia Procedure Notes (Signed)
Procedure Name: Intubation Date/Time: 04/10/2021 10:13 AM Performed by: Janene Harvey, CRNA Pre-anesthesia Checklist: Patient identified, Emergency Drugs available, Suction available and Patient being monitored Patient Re-evaluated:Patient Re-evaluated prior to induction Oxygen Delivery Method: Circle system utilized Preoxygenation: Pre-oxygenation with 100% oxygen Induction Type: IV induction and Rapid sequence Laryngoscope Size: Glidescope and 3 Grade View: Grade I Tube type: Oral Tube size: 7.5 mm Number of attempts: 1 Airway Equipment and Method: Stylet and Oral airway Placement Confirmation: ETT inserted through vocal cords under direct vision, positive ETCO2 and breath sounds checked- equal and bilateral Secured at: 23 cm Tube secured with: Tape Dental Injury: Teeth and Oropharynx as per pre-operative assessment  Comments: Elective glidescope - previous airway documentation of grade III view with miller 3

## 2021-04-10 NOTE — Interval H&P Note (Signed)
History and Physical Interval Note: no change in H and P  04/10/2021 9:27 AM  Henry Figueroa  has presented today for surgery, with the diagnosis of GALLSTONES.  The various methods of treatment have been discussed with the patient and family. After consideration of risks, benefits and other options for treatment, the patient has consented to  Procedure(s): LAPAROSCOPIC CHOLECYSTECTOMY (N/A) as a surgical intervention.  The patient's history has been reviewed, patient examined, no change in status, stable for surgery.  I have reviewed the patient's chart and labs.  Questions were answered to the patient's satisfaction.     Coralie Keens

## 2021-04-11 ENCOUNTER — Encounter (HOSPITAL_COMMUNITY): Payer: Self-pay | Admitting: Surgery

## 2021-04-11 LAB — SURGICAL PATHOLOGY

## 2021-04-16 ENCOUNTER — Encounter (HOSPITAL_COMMUNITY): Payer: Self-pay | Admitting: Surgery

## 2021-04-17 NOTE — Anesthesia Postprocedure Evaluation (Signed)
Anesthesia Post Note  Patient: Henry Figueroa  Procedure(s) Performed: LAPAROSCOPIC CHOLECYSTECTOMY     Patient location during evaluation: PACU Anesthesia Type: General Level of consciousness: awake Vital Signs Assessment: post-procedure vital signs reviewed and stable Respiratory status: spontaneous breathing Cardiovascular status: stable Postop Assessment: no apparent nausea or vomiting Anesthetic complications: no   No notable events documented.  Last Vitals:  Vitals:   04/10/21 1128 04/10/21 1135  BP:  (!) 150/98  Pulse: 80   Resp: 16 17  Temp:    SpO2: 97% 93%    Last Pain:  Vitals:   04/10/21 1135  TempSrc:   PainSc: [name]

## 2021-06-09 ENCOUNTER — Other Ambulatory Visit: Payer: Self-pay | Admitting: Internal Medicine

## 2021-06-09 DIAGNOSIS — I10 Essential (primary) hypertension: Secondary | ICD-10-CM

## 2021-07-22 ENCOUNTER — Ambulatory Visit
Admission: RE | Admit: 2021-07-22 | Discharge: 2021-07-22 | Disposition: A | Discharge status: Home / Self Care | Payer: Medicare Other | Source: Ambulatory Visit | Attending: Cardiology | Admitting: Cardiology

## 2021-07-22 ENCOUNTER — Other Ambulatory Visit: Payer: Self-pay | Admitting: Internal Medicine

## 2021-07-22 DIAGNOSIS — E785 Hyperlipidemia, unspecified: Secondary | ICD-10-CM

## 2021-07-22 DIAGNOSIS — I712 Thoracic aortic aneurysm, without rupture, unspecified: Secondary | ICD-10-CM

## 2021-07-22 DIAGNOSIS — I10 Essential (primary) hypertension: Secondary | ICD-10-CM

## 2021-07-22 MED ORDER — IOPAMIDOL (ISOVUE-370) INJECTION 76%
75.0000 mL | Freq: Once | INTRAVENOUS | Status: AC | PRN
  Administered 2021-07-22: 16:00:00 75 mL via INTRAVENOUS

## 2021-07-30 ENCOUNTER — Other Ambulatory Visit: Payer: Self-pay | Admitting: *Deleted

## 2021-07-30 ENCOUNTER — Other Ambulatory Visit: Payer: Self-pay

## 2021-07-30 DIAGNOSIS — K862 Cyst of pancreas: Secondary | ICD-10-CM

## 2021-07-30 DIAGNOSIS — I7121 Aneurysm of the ascending aorta, without rupture: Secondary | ICD-10-CM

## 2021-07-30 DIAGNOSIS — K869 Disease of pancreas, unspecified: Secondary | ICD-10-CM

## 2021-07-30 NOTE — Progress Notes (Signed)
Jerline Pain, MD  07/26/2021  7:50 AM EST     Stable moderate ascending aortic aneurysm. 4.8 cm. Repeat CTA of chest to evaluate aorta in one year

## 2021-08-04 ENCOUNTER — Other Ambulatory Visit: Payer: Self-pay | Admitting: Internal Medicine

## 2021-08-06 ENCOUNTER — Other Ambulatory Visit: Payer: Self-pay | Admitting: Gastroenterology

## 2021-08-06 ENCOUNTER — Ambulatory Visit (HOSPITAL_COMMUNITY)
Admission: RE | Admit: 2021-08-06 | Discharge: 2021-08-06 | Disposition: A | Discharge status: Home / Self Care | Payer: Medicare Other | Source: Ambulatory Visit | Attending: Gastroenterology | Admitting: Gastroenterology

## 2021-08-06 ENCOUNTER — Other Ambulatory Visit: Payer: Self-pay

## 2021-08-06 DIAGNOSIS — K862 Cyst of pancreas: Secondary | ICD-10-CM | POA: Diagnosis not present

## 2021-08-06 DIAGNOSIS — K869 Disease of pancreas, unspecified: Secondary | ICD-10-CM

## 2021-08-06 DIAGNOSIS — Z9049 Acquired absence of other specified parts of digestive tract: Secondary | ICD-10-CM | POA: Diagnosis not present

## 2021-08-06 MED ORDER — GADOBUTROL 1 MMOL/ML IV SOLN
10.0000 mL | Freq: Once | INTRAVENOUS | Status: AC | PRN
  Administered 2021-08-06: 10 mL via INTRAVENOUS

## 2021-08-07 ENCOUNTER — Other Ambulatory Visit: Payer: Self-pay | Admitting: Internal Medicine

## 2021-08-07 ENCOUNTER — Telehealth: Payer: Self-pay

## 2021-08-07 DIAGNOSIS — I10 Essential (primary) hypertension: Secondary | ICD-10-CM

## 2021-08-07 DIAGNOSIS — E785 Hyperlipidemia, unspecified: Secondary | ICD-10-CM

## 2021-08-07 NOTE — Telephone Encounter (Signed)
-----   Message from Milus Banister, MD sent at 08/07/2021  2:39 PM EST ----- Got it, thanks.  I'll have Ashonti Leandro reach out to help arrange.  Beautiful [place]yl, HE needs upper EUS, next available with either myself or Gabe for pancreatic cyst.  Thanks  Wynetta Fines  DJ ----- Message ----- From: Lavena Bullion, DO Sent: 08/06/2021   1:50 PM EST To: Milus Banister, MD  DJ,  Patient with incidentally noted 2 cm cyst in the pancreatic tail on recent CT.  Subsequent MRI/MRCP from earlier today demonstrates 2.1 x 1.9 x 1.9 cm cystic lesion in the pancreatic tail.  No definite ductal communication and no PD dilation.  Mild fatty atrophy of the pancreatic parenchyma.  Otherwise normal liver, bile ducts.  Patient does report family history of maternal grandmother with pancreatic cancer and mother with pancreatic cyst.  We discussed options for further evaluation to include 1) repeat MRI/MRCP in 6 months and 2) EUS.  He prefers the latter if you agree that EUS is appropriate.   As long as you agree that EUS is appropriate, he is okay with scheduling direct for EUS or coming into the office first to discuss, which ever is your preference.  Thanks.  [employer]

## 2021-08-08 ENCOUNTER — Other Ambulatory Visit: Payer: Self-pay

## 2021-08-08 DIAGNOSIS — K862 Cyst of pancreas: Secondary | ICD-10-CM

## 2021-08-08 DIAGNOSIS — K869 Disease of pancreas, unspecified: Secondary | ICD-10-CM

## 2021-08-08 NOTE — Telephone Encounter (Signed)
The pt has been scheduled for 09/29/21 at 1030 am at [facility] with GM.  Left message on machine to call back

## 2021-08-11 NOTE — Telephone Encounter (Signed)
EUS scheduled, pt instructed and medications reviewed.  Patient instructions mailed to home.  Patient to call with any questions or concerns.  

## 2021-08-19 ENCOUNTER — Encounter: Payer: Self-pay | Admitting: Gastroenterology

## 2021-08-20 NOTE — Telephone Encounter (Signed)
Please clarify, is he still taking the Protonix 40 mg/day?  If so, increase to 40 mg bid.  Also, send in Rx for Carafate 1 g qid x4 weeks.  Can schedule f/u OV in 6-8 weeks to allow time for medication to work.  Thanks.

## 2021-08-21 ENCOUNTER — Telehealth: Payer: Self-pay | Admitting: General Surgery

## 2021-08-21 ENCOUNTER — Other Ambulatory Visit: Payer: Self-pay | Admitting: General Surgery

## 2021-08-21 MED ORDER — PANTOPRAZOLE SODIUM 40 MG PO TBEC: 40.0000 mg | DELAYED_RELEASE_TABLET | Freq: Two times a day (BID) | ORAL | 0 refills | Status: DC

## 2021-08-21 MED ORDER — SUCRALFATE 1 G PO TABS: 1.0000 g | ORAL_TABLET | Freq: Every day | ORAL | 0 refills | Status: DC

## 2021-08-21 NOTE — Telephone Encounter (Signed)
error 

## 2021-08-21 NOTE — Telephone Encounter (Signed)
Left a voicemail for the patient to contact the office regarding the prescriptions he is requesting. Since no answer advised the patient I would send a my chart message as well and he can answer me there or call the office.

## 2021-08-27 ENCOUNTER — Other Ambulatory Visit: Payer: Self-pay | Admitting: General Surgery

## 2021-08-27 MED ORDER — ONDANSETRON HCL 4 MG PO TABS: 4.0000 mg | ORAL_TABLET | Freq: Four times a day (QID) | ORAL | 1 refills | Status: DC | PRN

## 2021-09-19 ENCOUNTER — Encounter (HOSPITAL_COMMUNITY): Payer: Self-pay | Admitting: Gastroenterology

## 2021-09-19 NOTE — Progress Notes (Signed)
Attempted to obtain medical history via telephone, unable to reach at this time. I left a voicemail to return pre surgical testing department's phone call.  

## 2021-09-29 ENCOUNTER — Ambulatory Visit (HOSPITAL_COMMUNITY): Payer: Medicare Other | Admitting: Certified Registered Nurse Anesthetist

## 2021-09-29 ENCOUNTER — Ambulatory Visit (HOSPITAL_BASED_OUTPATIENT_CLINIC_OR_DEPARTMENT_OTHER): Payer: Medicare Other | Admitting: Certified Registered Nurse Anesthetist

## 2021-09-29 ENCOUNTER — Encounter (HOSPITAL_COMMUNITY): Payer: Self-pay | Admitting: Gastroenterology

## 2021-09-29 ENCOUNTER — Encounter (HOSPITAL_COMMUNITY)
Admission: RE | Disposition: A | Discharge status: Home / Self Care | Payer: Self-pay | Source: Ambulatory Visit | Attending: Gastroenterology

## 2021-09-29 ENCOUNTER — Other Ambulatory Visit: Payer: Self-pay

## 2021-09-29 ENCOUNTER — Ambulatory Visit (HOSPITAL_COMMUNITY)
Admission: RE | Admit: 2021-09-29 | Discharge: 2021-09-29 | Disposition: A | Discharge status: Home / Self Care | Payer: Medicare Other | Source: Ambulatory Visit | Attending: Gastroenterology | Admitting: Gastroenterology

## 2021-09-29 DIAGNOSIS — K2289 Other specified disease of esophagus: Secondary | ICD-10-CM | POA: Diagnosis not present

## 2021-09-29 DIAGNOSIS — K2270 Barrett's esophagus without dysplasia: Secondary | ICD-10-CM | POA: Diagnosis not present

## 2021-09-29 DIAGNOSIS — G4733 Obstructive sleep apnea (adult) (pediatric): Secondary | ICD-10-CM | POA: Diagnosis not present

## 2021-09-29 DIAGNOSIS — K862 Cyst of pancreas: Secondary | ICD-10-CM

## 2021-09-29 DIAGNOSIS — I899 Noninfective disorder of lymphatic vessels and lymph nodes, unspecified: Secondary | ICD-10-CM | POA: Diagnosis not present

## 2021-09-29 DIAGNOSIS — K3189 Other diseases of stomach and duodenum: Secondary | ICD-10-CM | POA: Diagnosis not present

## 2021-09-29 DIAGNOSIS — I10 Essential (primary) hypertension: Secondary | ICD-10-CM

## 2021-09-29 DIAGNOSIS — Z800 Family history of malignant neoplasm of digestive organs: Secondary | ICD-10-CM | POA: Insufficient documentation

## 2021-09-29 DIAGNOSIS — K869 Disease of pancreas, unspecified: Secondary | ICD-10-CM

## 2021-09-29 DIAGNOSIS — Z87891 Personal history of nicotine dependence: Secondary | ICD-10-CM | POA: Diagnosis not present

## 2021-09-29 HISTORY — PX: EUS: SHX5427

## 2021-09-29 HISTORY — PX: FINE NEEDLE ASPIRATION: SHX5430

## 2021-09-29 HISTORY — PX: BIOPSY: SHX5522

## 2021-09-29 HISTORY — PX: ESOPHAGOGASTRODUODENOSCOPY: SHX5428

## 2021-09-29 SURGERY — UPPER ENDOSCOPIC ULTRASOUND (EUS) RADIAL
Anesthesia: Monitor Anesthesia Care

## 2021-09-29 MED ORDER — PROPOFOL 500 MG/50ML IV EMUL
INTRAVENOUS | Status: DC | PRN
  Administered 2021-09-29: 125 ug/kg/min via INTRAVENOUS

## 2021-09-29 MED ORDER — CIPROFLOXACIN IN D5W 400 MG/200ML IV SOLN
INTRAVENOUS | Status: AC
  Filled 2021-09-29: qty 200

## 2021-09-29 MED ORDER — SODIUM CHLORIDE 0.9 % IV SOLN: INTRAVENOUS | Status: DC

## 2021-09-29 MED ORDER — CIPROFLOXACIN HCL 500 MG PO TABS: 500.0000 mg | ORAL_TABLET | Freq: Two times a day (BID) | ORAL | 0 refills | Status: AC

## 2021-09-29 MED ORDER — LIDOCAINE 2% (20 MG/ML) 5 ML SYRINGE
INTRAMUSCULAR | Status: DC | PRN
  Administered 2021-09-29: 100 mg via INTRAVENOUS

## 2021-09-29 MED ORDER — PROPOFOL 10 MG/ML IV BOLUS
INTRAVENOUS | Status: DC | PRN
  Administered 2021-09-29: 30 mg via INTRAVENOUS
  Administered 2021-09-29: 20 mg via INTRAVENOUS

## 2021-09-29 MED ORDER — CIPROFLOXACIN IN D5W 400 MG/200ML IV SOLN
INTRAVENOUS | Status: DC | PRN
Start: 2021-09-29 — End: 2021-09-29
  Administered 2021-09-29: 400 mg via INTRAVENOUS

## 2021-09-29 MED ORDER — LACTATED RINGERS IV SOLN
INTRAVENOUS | Status: DC
Start: 2021-09-29 — End: 2021-09-29
  Administered 2021-09-29: 1000 mL via INTRAVENOUS

## 2021-09-29 NOTE — Anesthesia Preprocedure Evaluation (Addendum)
Anesthesia Evaluation  ?Patient identified by MRN, date of birth, ID band ?Patient awake ? ? ? ?Reviewed: ?Allergy & Precautions, NPO status , Patient's Chart, lab work & pertinent test results ? ?History of Anesthesia Complications ?Negative for: history of anesthetic complications ? ?Airway ?Mallampati: II ? ?TM Distance: >3 FB ?Neck ROM: Full ? ? ? Dental ? ?(+) Missing,  ?  ?Pulmonary ?sleep apnea , Patient abstained from smoking., former smoker,  ?  ?Pulmonary exam normal ? ? ? ? ? ? ? Cardiovascular ?hypertension, Pt. on medications ?Normal cardiovascular exam+ dysrhythmias (s/p ablation) Atrial Fibrillation  ? ? ?  ?Neuro/Psych ?Depression   ? GI/Hepatic ?Neg liver ROS, GERD  Medicated and Controlled,pancreatic cyst ?  ?Endo/Other  ?negative endocrine ROS ? Renal/GU ?negative Renal ROS  ?negative genitourinary ?  ?Musculoskeletal ? ?(+) Arthritis ,  ? Abdominal ?  ?Peds ? Hematology ?negative hematology ROS ?(+)   ?Anesthesia Other Findings ?Day of surgery medications reviewed with patient. ? Reproductive/Obstetrics ?negative OB ROS ? ?  ? ? ? ? ? ? ? ? ? ? ? ? ? ?  ?  ? ? ? ? ? ? ? ?Anesthesia Physical ?Anesthesia Plan ? ?ASA: 2 ? ?Anesthesia Plan: MAC  ? ?Post-op Pain Management: Minimal or no pain anticipated  ? ?Induction:  ? ?PONV Risk Score and Plan: Treatment may vary due to age or medical condition and Propofol infusion ? ?Airway Management Planned: Natural Airway and Nasal Cannula ? ?Additional Equipment: None ? ?Intra-op Plan:  ? ?Post-operative Plan:  ? ?Informed Consent: I have reviewed the patients History and Physical, chart, labs and discussed the procedure including the risks, benefits and alternatives for the proposed anesthesia with the patient or authorized representative who has indicated his/her understanding and acceptance.  ? ? ? ? ? ?Plan Discussed with: CRNA ? ?Anesthesia Plan Comments:   ? ? ? ? ? ?Anesthesia Quick Evaluation ? ?

## 2021-09-29 NOTE — Transfer of Care (Signed)
Immediate Anesthesia Transfer of Care Note ? ?Patient: ELIBERTO SOLE ? ?Procedure(s) Performed: UPPER ENDOSCOPIC ULTRASOUND (EUS) RADIAL ?ESOPHAGOGASTRODUODENOSCOPY (EGD) ?FINE NEEDLE ASPIRATION (FNA) LINEAR ?BIOPSY ? ?Patient Location: Endoscopy Unit ? ?Anesthesia Type:MAC ? ?Level of Consciousness: drowsy and patient cooperative ? ?Airway & Oxygen Therapy: Patient Spontanous Breathing and Patient connected to face mask oxygen ? ?Post-op Assessment: Report given to RN and Post -op Vital signs reviewed and stable ? ?Post vital signs: Reviewed and stable ? ?Last Vitals:  ?Vitals Value Taken Time  ?BP 147/65   ?Temp    ?Pulse 74 09/29/21 1052  ?Resp 17 09/29/21 1052  ?SpO2 96 % 09/29/21 1052  ?Vitals shown include unvalidated device data. ? ?Last Pain:  ?Vitals:  ? 09/29/21 0925  ?TempSrc: Oral  ?PainSc: 0-No pain  ?   ? ?  ? ?Complications: No notable events documented. ?

## 2021-09-29 NOTE — Anesthesia Postprocedure Evaluation (Signed)
Anesthesia Post Note ? ?Patient: ANFERNEE PESCHKE ? ?Procedure(s) Performed: UPPER ENDOSCOPIC ULTRASOUND (EUS) RADIAL ?ESOPHAGOGASTRODUODENOSCOPY (EGD) ?FINE NEEDLE ASPIRATION (FNA) LINEAR ?BIOPSY ? ?  ? ?Patient location during evaluation: PACU ?Anesthesia Type: MAC ?Level of consciousness: awake and alert ?Pain management: pain level controlled ?Vital Signs Assessment: post-procedure vital signs reviewed and stable ?Respiratory status: spontaneous breathing, nonlabored ventilation and respiratory function stable ?Cardiovascular status: blood pressure returned to baseline ?Postop Assessment: no apparent nausea or vomiting ?Anesthetic complications: no ? ? ?No notable events documented. ? ?Last Vitals:  ?Vitals:  ? 09/29/21 1100 09/29/21 1110  ?BP: 130/72 120/67  ?Pulse: 70 69  ?Resp: (!) 21 17  ?Temp:    ?SpO2: 93% 98%  ?  ?Last Pain:  ?Vitals:  ? 09/29/21 1110  ?TempSrc:   ?PainSc: 0-No pain  ? ? ?  ?  ?  ?  ?  ?  ? ?Marthenia Rolling ? ? ? ? ?

## 2021-09-29 NOTE — Op Note (Signed)
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 Estimated Blood Loss:     Estimated blood loss was minimal. Procedure:                Pre-Anesthesia Assessment:                           - Prior to the procedure, a History and Physical                            was performed, and patient medications and                            allergies were reviewed. The patient's tolerance of                            previous anesthesia was also reviewed. The risks                            and benefits of the procedure and the sedation                            options and risks were discussed with the patient.                            All questions were answered, and informed consent                            was obtained. Prior Anticoagulants: The patient has                            taken no previous anticoagulant or antiplatelet                            agents. ASA Grade Assessment: III - A patient with  severe systemic disease. After reviewing the risks                            and benefits, the patient was deemed in                            satisfactory condition to undergo the procedure.                           After obtaining informed consent, the endoscope was                            passed under direct vision. Throughout the                             procedure, the patient's blood pressure, pulse, and                            oxygen saturations were monitored continuously. The                            [id number]-[id number] ([id number]) Olympus endoscope was introduced                            through the mouth, and advanced to the second part                            of duodenum. The [id number]-[id number] ([id number]) Olympus                            duodenoscope was introduced through the mouth, and                            advanced to the area of papilla. The [id number]                            ([id number]) Olympus linear ultrasound scope was                            introduced through the mouth, and advanced to the                            duodenum for ultrasound examination from the                            stomach and duodenum. The upper EUS was                            accomplished without difficulty. The patient                            tolerated the procedure. Scope In: Scope Out: Findings:      ENDOSCOPIC FINDING: :      No gross lesions were noted in  the proximal esophagus and in the mid       esophagus.      One tongue of salmon-colored mucosa was present from 42 to 44 cm. No       other visible abnormalities were present. The maximum longitudinal       extent of these esophageal mucosal changes was 2 cm in length. Biopsies       were taken with a cold forceps for histology.      The Z-line was irregular and was found 44 cm from the incisors.      Striped mildly erythematous mucosa without bleeding was found in the       entire examined stomach. Biopsies have been performed previously and       negative x 2 so not performed again.      No gross lesions were noted in the duodenal bulb, in the first portion       of the duodenum and in the second portion of the duodenum.      The major papilla was normal.      ENDOSONOGRAPHIC FINDING: :      An anechoic lesion suggestive of a cyst was identified in the  pancreatic       tail. It is not in obvious communication with the pancreatic duct the       the pancreatic duct is close by. The lesion measured 23 mm by 19 mm in       maximal cross-sectional diameter. There was a single compartment without       septae. The outer wall of the lesion was thin. There was no associated       mass. There was no internal debris within the fluid-filled cavity.       Diagnostic needle aspiration for fluid was performed. Color Doppler       imaging was utilized prior to needle puncture to confirm a lack of       significant vascular structures within the needle path. One pass was       made with the 22 gauge needle using a transgastric approach. A stylet       was used. The amount of fluid collected was 2 mL. The fluid was clear       and yellow. Sample sent for amylase concentration, cytology, CEA and       KRAS mutation analysis (if CEA >192).      The pancreatic duct had a normal endosonographic appearance in the       pancreatic head (PD - 1.5 mm -> 0.7 mm), genu of the pancreas (PD - 0.8       mm), body of the pancreas (PD - 0.6 mm) and tail of the pancreas (PD -       0.8 mm).      Pancreatic parenchymal abnormalities were noted in the entire pancreas.       These consisted of diffuse heterogeniety. No evidence of a mass or       lesion otherwise within the parenchyma.      There was no sign of significant endosonographic abnormality in the       common bile duct (2.9 mm) and in the common hepatic duct (5.3 mm). No       stones and ducts with regular contour were identified.      Endosonographic imaging of the ampulla showed no extrinsic compression,       intramural (subepithelial) lesion, mass, varices or wall thickening.  Endosonographic imaging in the visualized portion of the liver showed no       mass.      No malignant-appearing lymph nodes were visualized in the celiac region       (level 20), peripancreatic region and porta hepatis region.       The celiac region was visualized. Impression:               EGD Impression:                           - No gross lesions in esophagus proximally.                            Salmon-colored mucosal tongue suspicious for                            short-segment Barrett's esophagus biopsied distally.                           - Z-line irregular, 44 cm from the incisors.                           - Erythematous mucosa in the stomach - previously                            biopsied and HP negative.                           - No gross lesions in the duodenal bulb, in the                            first portion of the duodenum and in the second                            portion of the duodenum.                           - Normal major papilla.                           EUS Impression:                           - A cystic lesion was seen in the pancreatic tail.                            Tissue has not been obtained. 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 Fine needle aspiration for fluid  performed.                           - The pancreatic duct had a normal endosonographic                            appearance in the pancreatic head, genu of the                            pancreas, body of the pancreas and tail of the                            pancreas.                           - Pancreatic parenchymal abnormalities consisting                            of diffuse echogenicity were noted in the entire                            pancreas. No mass or lesion was noted otherwise                            within the parenchyma                           - There was no sign of significant pathology in the                            common bile duct and in the common hepatic duct.                           - No  malignant-appearing lymph nodes were                            visualized in the celiac region (level 20),                            peripancreatic region and porta hepatis region. Moderate Sedation:      Not Applicable - Patient had care per Anesthesia. Recommendation:           - The patient will be observed post-procedure,                            until all discharge criteria are met.                           - Discharge patient to home.                           - Patient has a contact number available for                            emergencies. The signs and symptoms  of potential                            delayed complications were discussed with the                            patient. Return to normal activities tomorrow.                            Written discharge instructions were provided to the                            patient.                           - Low fat diet for 2 weeks.                           - Cipro (ciprofloxacin) 500 mg PO BID for 3 days.                           - Continue present medications.                           - Monitor for signs/symptoms of pancreatitis,                            bleeding, perforation, and infection. If issues                            please call our number to get further assistance as                            needed.                           - Await cytology results and await path results.                           - Based on results imaging follow up will be                            dictated with MRI/MRCP most likely.                           - The findings and recommendations were discussed                            with the patient.                           - The findings and recommendations were discussed                            with the designated responsible adult. Procedure Code(s):        --- Professional ---  43238, Esophagogastroduodenoscopy, flexible,                             transoral; with transendoscopic ultrasound-guided                            intramural or transmural fine needle                            aspiration/biopsy(s), (includes endoscopic                            ultrasound examination limited to the esophagus,                            stomach or duodenum, and adjacent structures) Diagnosis Code(s):        --- Professional ---                           K22.8, Other specified diseases of esophagus                           K31.89, Other diseases of stomach and duodenum                           K86.2, Cyst of pancreas                           K86.9, Disease of pancreas, unspecified                           I89.9, Noninfective disorder of lymphatic vessels                            and lymph nodes, unspecified CPT copyright 2019 American Medical Association. All rights reserved. The codes documented in this report are preliminary and upon coder review may  be revised to meet current compliance requirements. Justice Britain, MD 09/29/2021 11:13:27 AM Number of Addenda: 0
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
## 2021-09-30 ENCOUNTER — Encounter (HOSPITAL_COMMUNITY): Payer: Self-pay | Admitting: Gastroenterology

## 2021-09-30 LAB — SURGICAL PATHOLOGY

## 2021-10-01 ENCOUNTER — Encounter: Payer: Self-pay | Admitting: Gastroenterology

## 2021-10-02 ENCOUNTER — Other Ambulatory Visit: Payer: Self-pay | Admitting: General Surgery

## 2021-10-02 MED ORDER — ONDANSETRON HCL 4 MG PO TABS: 4.0000 mg | ORAL_TABLET | Freq: Four times a day (QID) | ORAL | 1 refills | Status: DC | PRN

## 2021-10-02 NOTE — Progress Notes (Addendum)
Cirigliano, Henry Pea DO  Marice Potter, RN ?Please place recall for EGD in 1 year for follow-up of non-dysplastic Barrett's Esophagus. Can be done in [place]. To continue GERD meds as previously prescribed.  ? ? ?1 year recall placed for EGD in [place]. ?

## 2021-10-13 ENCOUNTER — Other Ambulatory Visit: Payer: Self-pay | Admitting: Gastroenterology

## 2021-10-14 ENCOUNTER — Telehealth: Payer: Self-pay | Admitting: Gastroenterology

## 2021-10-14 NOTE — Telephone Encounter (Signed)
I called to discuss the results of the recent pancreatic cyst aspiration. ? ?CEA 12 ng/mL ?Amylase 174 units/L ? ?These findings are consistent with a nonmucinous cyst.  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  Often these do not require long-term surveillance.  With that being said, with his family history it is reasonable for [name] to consider a follow-up.  I recommend a repeat MRI/MRCP in 1 year.  As long as there is no abnormality or significant alteration in cyst size or findings then we would plan to potentially stop follow-up after 1 more MRI/MRCP. ? ?The patient was appreciative for the call back and discussion and for all the care he has received at [facility]. ? ?I will forward this information to the patient's primary gastroenterologist, Dr. Bryan Lemma.  The patient will follow-up with Dr. Bryan Lemma and I will be available if findings occur or changes occur on follow-up imaging. ? ? ?Justice Britain, MD ?[facility] ?Advanced Endoscopy ?Office # [phone] ?

## 2021-10-15 NOTE — Telephone Encounter (Signed)
Recall has been placed to be sent to Dr C's nurse to be completed in 1 year  ?

## 2021-10-22 ENCOUNTER — Other Ambulatory Visit: Payer: Self-pay | Admitting: Gastroenterology

## 2021-11-06 ENCOUNTER — Encounter: Payer: Self-pay | Admitting: Gastroenterology

## 2021-11-06 ENCOUNTER — Ambulatory Visit (INDEPENDENT_AMBULATORY_CARE_PROVIDER_SITE_OTHER): Payer: 59 | Admitting: Gastroenterology

## 2021-11-06 ENCOUNTER — Other Ambulatory Visit: Payer: Self-pay | Admitting: Internal Medicine

## 2021-11-06 VITALS — BP 114/70 | Ht 72.0 in | Wt 223.5 lb

## 2021-11-06 DIAGNOSIS — R112 Nausea with vomiting, unspecified: Secondary | ICD-10-CM | POA: Diagnosis not present

## 2021-11-06 DIAGNOSIS — K219 Gastro-esophageal reflux disease without esophagitis: Secondary | ICD-10-CM | POA: Diagnosis not present

## 2021-11-06 DIAGNOSIS — K862 Cyst of pancreas: Secondary | ICD-10-CM

## 2021-11-06 DIAGNOSIS — K2270 Barrett's esophagus without dysplasia: Secondary | ICD-10-CM

## 2021-11-06 DIAGNOSIS — R630 Anorexia: Secondary | ICD-10-CM | POA: Diagnosis not present

## 2021-11-06 DIAGNOSIS — Z86010 Personal history of colonic polyps: Secondary | ICD-10-CM

## 2021-11-06 DIAGNOSIS — R634 Abnormal weight loss: Secondary | ICD-10-CM

## 2021-11-06 MED ORDER — ONDANSETRON HCL 4 MG PO TABS: 4.0000 mg | ORAL_TABLET | Freq: Four times a day (QID) | ORAL | 5 refills | Status: DC | PRN

## 2021-11-06 NOTE — Patient Instructions (Addendum)
If you are age 67 or older, your body mass index should be between 23-30. Your Body mass index is 30.31 kg/m?Marland Kitchen If this is out of the aforementioned range listed, please consider follow up with your Primary Care Provider. ? ?__________________________________________________________ ? ?The [facility] providers would like to encourage you to use [facility] to communicate with providers for non-urgent requests or questions.  Due to long hold times on the telephone, sending your provider a message by [facility] may be a faster and more efficient way to get a response.  Please allow 48 business hours for a response.  Please remember that this is for non-urgent requests.  ? ?Due to recent changes in healthcare laws, you may see the results of your imaging and laboratory studies on MyChart before your provider has had a chance to review them.  We understand that in some cases there may be results that are confusing or concerning to you. Not all laboratory results come back in the same time frame and the provider may be waiting for multiple results in order to interpret others.  Please give [name] 48 hours in order for your provider to thoroughly review all the results before contacting the office for clarification of your results.  ? ?We have sent the following medications to your pharmacy for you to pick up at your convenience: Zofran ? ?It has been recommended to you by your physician that you have a colonoscopy completed. Per your request, we did not schedule the procedure(s) today. Please contact our office at [phone] should you decide to have the procedure completed. You will be scheduled for a pre-visit and procedure at that time.  ? ?Thank you for choosing me and [facility]. ? ?Gerrit Heck, D.O. ? ? ?We want to thank you for trusting [facility] with your care. All of our staff and providers value the relationships we have built with our patients, and it is an honor to care for  you.  ? ?We are writing to let you know that [facility] will close on Dec 08, 2021, and we invite you to continue to see Dr. Carmell Austria and Gerrit Heck at the [facility] [place] office location. We are consolidating our serices at these [facility] practices to better provide care. Our office staff will work with you to ensure a seamless transition.  ? ?Gerrit Heck, DO -Dr. Bryan Lemma will be movig to [facility] at [address] [place], [place] [address], effective Dec 08, 2021.  Contact [phone] to schedule an appointment with him.  ? ?Carmell Austria, MD- Dr. Lyndel Safe will be movig to [facility] at [address] [place], [place] [address], effective Dec 08, 2021.  Contact [phone] to schedule an appointment with him.  ? ?Requesting Medical Records ?If you need to request your medical records, please follow the instructions below. Your medical records are confidential, and a copy can be transferred to another provider or released to you or another person you designate only with your permission. ? ?There are several ways to request your medical records: ?Requests for medical records can be submitted through our practice.   ?You can also request your records electronically, in your MyChart account by selecting the ?Request Health Records? tab.  ?If you need additional information on how to request records, please go to [email or web address]/, choose Patient Information, then select Request Medical Records. ?To make an appointment or if you have any questions about your health care needs, please contact  our office at [phone] and one of our staff members will be glad to assist you. ?[facility] is committed to providing exceptional care for you and our community. Thank you for allowing [name] to serve your health care needs. ?Sincerely, ? ?Windy Canny, Director [facility] ?[facility] also offers convenient virtual care  options. Sore throat? Sinus problems? Cold or flu symptoms? Get care from the comfort of home with [facility] Video Visits and e-Visits. Learn more about the non-emergency conditions treated and start your virtual visit at [email or web address] ? ? ?

## 2021-11-06 NOTE — Progress Notes (Signed)
? ?Chief Complaint:    Medication refill, nausea, procedure follow-up ? ?GI History: 66 year old male history of atrial fibrillation, OSA (on CPAP), Hyperlipidemia, gout (diet-controlled), and additional medical history as below, follows in the GI clinic for the following: ?  ?1) Weight loss, nausea/vomiting: 40# weight loss over 6 months from May- November 2021.  Some weight loss was intentional, but not to the degree.  Some decrease in appetite and nausea/vomiting. ?- 05/2020: Normal CBC, CMP, vitamin D, TSH ?- 06/2020: Normal/negative GI PCR panel, ova and parasite, fecal lactoferrin ?- 06/2020: RUQ [name]: Cholelithiasis without cholecystitis, normal CBD, normal liver ?- EGD (06/2020): Erosive esophagitis, gastritis/duodenitis with antral ulcers and duodenal ulcers.  Started on high-dose Protonix and Carafate ?- Normal fasting gastrin ?- 07/2020: Weight stabilized.  Still with nausea but improved ?-Repeat EGD (07/2020): Significant improvement with mild gastritis/duodenitis.  Started to titrate Protonix and Carafate ?-10/2020. Weight improving and n/v resolved ?- 11/25/2020: CT head: No acute intracranial abnormality, mild to moderate chronic microvascular ischemic disease.  Sinusitis-recommended follow-up with PCM ?- 11/25/2020: CT A/P: Large gallstones (up to 4.5 cm) without cholecystitis.  Cystic lesion in tail of pancreas measuring 2.1 x 1.8 cm without duct dilation. 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  Mild fatty atrophy of the pancreatic parenchyma. ?- 09/29/2021: EUS: 23 mm x 19 mm tail of pancreas cyst without obvious PD communication.  FNA: CEA 12, amylase 174 consistent with benign mucinous cyst (most likely serous cyst).  Otherwise normal PD and pancreatic parenchyma.  Irregular Z-line (path: Intestinal metaplasia without dysplasia), Mild gastritis.  Recommend MRI/MRCP in 1 year ?  ?2) Change in  stools: Loose, nonbloody stools, with 3-4 BM/day starting 2021.  Resolved early 2022. ? ?  ?Endoscopic History: ?- Colonoscopy (11/2008): Normal per patient ?- Colonoscopy (06/2019): Fair prep, 6 tubular adenomas measuring 5-9 mm in sigmoid, descending, transverse, diverticulosis, 3 nonbleeding colonic AVMs, internal hemorrhoids.  Repeat in 1-2 years due to suboptimal bowel prep ?- EGD (06/2020): LA Grade B esophagitis, moderate gastritis with antral ulcers (path negative for H. pylori), moderate duodenitis with shallow ulcers in D2 (path benign) ?-EGD (07/2020): Normal esophagus, mild gastritis but no ulcers, mild duodenitis and 1 healing ulcer.  Overall much improved.  Path negative for H. pylori ?- EGD/EUS (09/2021): Short segment nondysplastic Barrett's Esophagus, serous cyst of pancreatic tail ? ?HPI:   ? ? ?Patient is a 66 y.o. male presenting to the [facility]ology Clinic for follow-up. Last seen by me 11/15/2020 for evaluation of recurrence of nausea/vomiting.  Restarted his Protonix and gave an Rx for Zofran along with CT head (normal) and CT abdomen/pelvis (pancreatic cyst, otherwise unremarkable), followed by EGD/EUS earlier this year. ? ?Today, he states he continues to have periodic episodes of nausea and decreased appetite.  Symptoms will then "normalize", but even during those "normal" times, he feels his appetite is overall reduced and only consumes 1 meal/day.  No early satiety.  Can have recurrence of symptoms lasting up to 72 hours at a time. Some nausea, but not always.  No emesis.  No change in bowel habits. ? ?Weight down 12# since last appointment in 10/2020. ? ?Review of systems:     No chest pain, no SOB, no fevers, no urinary sx  ? ?Past Medical History:  ?Diagnosis Date  ? Arthritis   ? "knees" (06/07/2018)  ? Atrial fibrillation ([place]) 2019  ? ablation done   ?  Basal cell carcinoma   ? "off my back" (06/07/2018)  ? High cholesterol   ? History of gout X 1  ? "left toe; went away w/diet change"  (06/07/2018)  ? History of kidney stones   ? Hypertension   ? OSA on CPAP   ? "got October 2019" (06/07/2018)  ? Sleep apnea   ? pt does not wear a c-pap  ? Substance abuse ([place])   ? recovering alcoholic  ? ? ?Patient's surgical history, family medical history, social history, medications and allergies were all reviewed in Epic  ? ? ?Current Outpatient Medications  ?Medication Sig Dispense Refill  ? acetaminophen (TYLENOL) 500 MG tablet Take 1,000 mg by mouth every 6 (six) hours as needed for moderate pain.    ? carvedilol (COREG) 12.5 MG tablet TAKE 1 TABLET BY MOUTH 2 (TWO) TIMES DAILY WITH A MEAL. 180 tablet 1  ? indapamide (LOZOL) 2.5 MG tablet TAKE 1 TABLET BY MOUTH EVERY DAY 90 tablet 0  ? olmesartan (BENICAR) 40 MG tablet TAKE 1 TABLET BY MOUTH EVERY DAY 90 tablet 0  ? OVER THE COUNTER MEDICATION Apply 1 application topically daily as needed (pain). CBD cream    ? pantoprazole (PROTONIX) 40 MG tablet TAKE 1 TABLET BY MOUTH TWICE A DAY 60 tablet 0  ? rosuvastatin (CRESTOR) 10 MG tablet TAKE 1 TABLET BY MOUTH EVERY DAY 90 tablet 1  ? sucralfate (CARAFATE) 1 g tablet TAKE 1 TABLET BY MOUTH DAILY. 60 tablet 0  ? ondansetron (ZOFRAN) 4 MG tablet Take 1 tablet (4 mg total) by mouth every 6 (six) hours as needed for up to 30 doses for nausea or vomiting. (Patient not taking: Reported on 11/06/2021) 30 tablet 1  ? ?No current facility-administered medications for this visit.  ? ? ?Physical Exam:   ? ? ?BP 114/70   Ht 6' (1.829 m)   Wt 223 lb 8 oz (101.4 kg)   BMI 30.31 kg/m?  ? ?GENERAL:  Pleasant male in NAD ?PSYCH: : Cooperative, normal affect ?EENT:  conjunctiva pink, mucous membranes moist, neck supple without masses ?ABDOMEN:  Nondistended, soft, nontender ?SKIN:  turgor, no lesions seen ?Musculoskeletal:  Normal muscle tone, normal strength ?NEURO: Alert and oriented x 3, no focal neurologic deficits ? ? ?IMPRESSION and PLAN:   ? ?1) Decreased appetite ?2) Weight loss ?3) Nausea ? ?- Refill Zofran as this  improves his nausea ?- Has otherwise had an extensive endoscopic and radiographic work-up, to include CT head, CT abdomen/pelvis, EGD x2, EUS ?-Reviewed medications with him today.  Lozol has <5% ADR of anorexia, nausea, and loss of appetite.  Will discuss with his PCM, Dr. Ronnald Ramp re: stopping or changing to alternative therapy ? ?4) History of colon polyps ?- Due for repeat colonoscopy due to suboptimal prep and history of polyps ?- Patient prefers to wait until evaluation above.  We will tentatively plan for August ? ?5) GERD with erosive esophagitis ?6) Nondysplastic, short segment Barrett's Esophagus ?- Recent EGD/EUS with resolution of erosive esophagitis, but with 2 cm tongue of nondysplastic BE ?- Continue Protonix ?- Consider repeat upper endoscopy in 1 year per guidelines ? ?7) Pancreatic cyst ?- Completed EGD/EUS earlier this year as above.  FNA c/w serous cyst ?- MRI Pancreas protocol in 09/2022 ? ?I spent 30 minutes of time, including in depth chart review, independent review of results as outlined above, communicating results with the patient directly, face-to-face time with the patient, coordinating care, and ordering studies and medications as  appropriate, and documentation. ? ? ?    ?    ? ?Lavena Bullion ,DO, FACG 11/06/2021, 11:56 AM ? ?

## 2021-11-08 ENCOUNTER — Other Ambulatory Visit: Payer: Self-pay | Admitting: Internal Medicine

## 2021-11-08 DIAGNOSIS — I10 Essential (primary) hypertension: Secondary | ICD-10-CM

## 2021-11-11 ENCOUNTER — Other Ambulatory Visit: Payer: Self-pay | Admitting: Gastroenterology

## 2021-11-20 ENCOUNTER — Other Ambulatory Visit: Payer: Self-pay | Admitting: Internal Medicine

## 2021-11-20 DIAGNOSIS — I10 Essential (primary) hypertension: Secondary | ICD-10-CM

## 2021-12-08 ENCOUNTER — Telehealth: Payer: Self-pay

## 2021-12-08 NOTE — Telephone Encounter (Signed)
Contacted patient and scheduled a physical with you on 12/23/21 at 8:40a. ?

## 2021-12-08 NOTE — Telephone Encounter (Signed)
Received an Rx request via efax,   ?Rx [phone] ?Medication: INDAPAMIDE 2.5 MG TABLET ?Qty. Prescribed: 90 ?Prescribed refills: 0 ?Date written: /12/23 ?SIG: take 1 tablet by mouth every day ?Last date filled: 08/29/21 ? ? ?Per chart, I do not see where this medication was ever prescribed, please advise. ?

## 2021-12-12 ENCOUNTER — Other Ambulatory Visit: Payer: Self-pay | Admitting: Internal Medicine

## 2021-12-12 DIAGNOSIS — I10 Essential (primary) hypertension: Secondary | ICD-10-CM

## 2021-12-23 ENCOUNTER — Encounter: Payer: Self-pay | Admitting: Internal Medicine

## 2021-12-23 ENCOUNTER — Ambulatory Visit (INDEPENDENT_AMBULATORY_CARE_PROVIDER_SITE_OTHER): Payer: 59 | Admitting: Internal Medicine

## 2021-12-23 VITALS — BP 104/76 | HR 90 | Temp 98.4°F | Resp 16 | Ht 72.0 in | Wt 220.0 lb

## 2021-12-23 DIAGNOSIS — E781 Pure hyperglyceridemia: Secondary | ICD-10-CM | POA: Diagnosis not present

## 2021-12-23 DIAGNOSIS — I959 Hypotension, unspecified: Secondary | ICD-10-CM | POA: Insufficient documentation

## 2021-12-23 DIAGNOSIS — N400 Enlarged prostate without lower urinary tract symptoms: Secondary | ICD-10-CM

## 2021-12-23 DIAGNOSIS — E785 Hyperlipidemia, unspecified: Secondary | ICD-10-CM | POA: Diagnosis not present

## 2021-12-23 DIAGNOSIS — I10 Essential (primary) hypertension: Secondary | ICD-10-CM | POA: Diagnosis not present

## 2021-12-23 DIAGNOSIS — E876 Hypokalemia: Secondary | ICD-10-CM | POA: Insufficient documentation

## 2021-12-23 DIAGNOSIS — I441 Atrioventricular block, second degree: Secondary | ICD-10-CM | POA: Insufficient documentation

## 2021-12-23 DIAGNOSIS — Z23 Encounter for immunization: Secondary | ICD-10-CM | POA: Insufficient documentation

## 2021-12-23 DIAGNOSIS — F322 Major depressive disorder, single episode, severe without psychotic features: Secondary | ICD-10-CM

## 2021-12-23 DIAGNOSIS — T502X5A Adverse effect of carbonic-anhydrase inhibitors, benzothiadiazides and other diuretics, initial encounter: Secondary | ICD-10-CM

## 2021-12-23 DIAGNOSIS — Z0000 Encounter for general adult medical examination without abnormal findings: Secondary | ICD-10-CM

## 2021-12-23 LAB — HEPATIC FUNCTION PANEL
ALT: 13 U/L (ref 0–53)
AST: 18 U/L (ref 0–37)
Albumin: 4.3 g/dL (ref 3.5–5.2)
Alkaline Phosphatase: 46 U/L (ref 39–117)
Bilirubin, Direct: 0.1 mg/dL (ref 0.0–0.3)
Total Bilirubin: 0.6 mg/dL (ref 0.2–1.2)
Total Protein: 6.9 g/dL (ref 6.0–8.3)

## 2021-12-23 LAB — BASIC METABOLIC PANEL
BUN: 20 mg/dL (ref 6–23)
CO2: 28 mEq/L (ref 19–32)
Calcium: 9.7 mg/dL (ref 8.4–10.5)
Chloride: 94 mEq/L — ABNORMAL LOW (ref 96–112)
Creatinine, Ser: 1.31 mg/dL (ref 0.40–1.50)
GFR: 56.45 mL/min — ABNORMAL LOW (ref >60.00)
Glucose, Bld: 76 mg/dL (ref 70–99)
Potassium: 3.3 mEq/L — ABNORMAL LOW (ref 3.5–5.1)
Sodium: 136 mEq/L (ref 135–145)

## 2021-12-23 LAB — URINALYSIS, ROUTINE W REFLEX MICROSCOPIC
Bilirubin Urine: NEGATIVE
Hgb urine dipstick: NEGATIVE
Ketones, ur: NEGATIVE
Leukocytes,Ua: NEGATIVE
Nitrite: NEGATIVE
RBC / HPF: NONE SEEN (ref 0–?)
Specific Gravity, Urine: 1.01 (ref 1.000–1.030)
Total Protein, Urine: NEGATIVE
Urine Glucose: NEGATIVE
Urobilinogen, UA: 0.2 (ref 0.0–1.0)
pH: 6 (ref 5.0–8.0)

## 2021-12-23 LAB — LIPID PANEL
Cholesterol: 139 mg/dL (ref 0–200)
HDL: 88.9 mg/dL (ref >39.00)
LDL Cholesterol: 20 mg/dL (ref 0–99)
NonHDL: 50.39
Total CHOL/HDL Ratio: 2
Triglycerides: 153 mg/dL — ABNORMAL HIGH (ref 0.0–149.0)
VLDL: 30.6 mg/dL (ref 0.0–40.0)

## 2021-12-23 LAB — CBC WITH DIFFERENTIAL/PLATELET
Basophils Absolute: 0.1 10*3/uL (ref 0.0–0.1)
Basophils Relative: 0.8 % (ref 0.0–3.0)
Eosinophils Absolute: 0.1 10*3/uL (ref 0.0–0.7)
Eosinophils Relative: 1.3 % (ref 0.0–5.0)
HCT: 41.3 % (ref 39.0–52.0)
Hemoglobin: 14.1 g/dL (ref 13.0–17.0)
Lymphocytes Relative: 38.8 % (ref 12.0–46.0)
Lymphs Abs: 2.4 10*3/uL (ref 0.7–4.0)
MCHC: 34 g/dL (ref 30.0–36.0)
MCV: 99.5 fl (ref 78.0–100.0)
Monocytes Absolute: 0.5 10*3/uL (ref 0.1–1.0)
Monocytes Relative: 8.8 % (ref 3.0–12.0)
Neutro Abs: 3.1 10*3/uL (ref 1.4–7.7)
Neutrophils Relative %: 50.3 % (ref 43.0–77.0)
Platelets: 207 10*3/uL (ref 150.0–400.0)
RBC: 4.15 Mil/uL — ABNORMAL LOW (ref 4.22–5.81)
RDW: 15 % (ref 11.5–15.5)
WBC: 6.2 10*3/uL (ref 4.0–10.5)

## 2021-12-23 LAB — CORTISOL: Cortisol, Plasma: 13.6 ug/dL

## 2021-12-23 LAB — PSA: PSA: 2.16 ng/mL (ref 0.10–4.00)

## 2021-12-23 LAB — TSH: TSH: 1.69 u[IU]/mL (ref 0.35–5.50)

## 2021-12-23 MED ORDER — SHINGRIX 50 MCG/0.5ML IM SUSR: 0.5000 mL | Freq: Once | INTRAMUSCULAR | 1 refills | Status: AC

## 2021-12-23 MED ORDER — CARVEDILOL 6.25 MG PO TABS: 6.2500 mg | ORAL_TABLET | Freq: Two times a day (BID) | ORAL | 0 refills | Status: DC

## 2021-12-23 MED ORDER — POTASSIUM CHLORIDE CRYS ER 15 MEQ PO TBCR: 15.0000 meq | EXTENDED_RELEASE_TABLET | Freq: Three times a day (TID) | ORAL | 1 refills | Status: DC

## 2021-12-23 NOTE — Progress Notes (Unsigned)
Subjective:  Patient ID: Henry Figueroa, male    DOB: Jun 02, 1956  Age: 66 y.o. MRN: [id number]  CC: Hypertension and Hyperlipidemia   HPI Henry Figueroa presents for f/up -  He walks nearly every day and does not experience chest pain, shortness of breath, dizziness, lightheadedness, palpitations, edema, near-syncope, or syncope.  He continues to complain of poor appetite with intermittent nausea and vomiting.  He has been seeing GI and has been prescribed Zofran. He uses THC in the form of delta 9.  Based on prescription refills it looks like he is not taking the ARB or thiazide diuretic but he thinks he may be.  He tells me he has been taking carvedilol.  Outpatient Medications Prior to Visit  Medication Sig Dispense Refill   acetaminophen (TYLENOL) 500 MG tablet Take 1,000 mg by mouth every 6 (six) hours as needed for moderate pain.     ondansetron (ZOFRAN) 4 MG tablet Take 1 tablet (4 mg total) by mouth every 6 (six) hours as needed for up to 30 doses for nausea or vomiting. 30 tablet 5   OVER THE COUNTER MEDICATION Apply 1 application topically daily as needed (pain). CBD cream     pantoprazole (PROTONIX) 40 MG tablet TAKE 1 TABLET BY MOUTH TWICE A DAY 180 tablet 1   rosuvastatin (CRESTOR) 10 MG tablet TAKE 1 TABLET BY MOUTH EVERY DAY 90 tablet 1   sucralfate (CARAFATE) 1 g tablet TAKE 1 TABLET BY MOUTH DAILY. 60 tablet 0   carvedilol (COREG) 12.5 MG tablet TAKE 1 TABLET BY MOUTH 2 (TWO) TIMES DAILY WITH A MEAL. 180 tablet 1   olmesartan (BENICAR) 40 MG tablet TAKE 1 TABLET BY MOUTH EVERY DAY 90 tablet 0   No facility-administered medications prior to visit.    ROS Review of Systems  Constitutional:  Positive for unexpected weight change (wt loss). Negative for chills, diaphoresis, fatigue and fever.  HENT: Negative.  Negative for trouble swallowing.   Eyes: Negative.  Negative for visual disturbance.  Respiratory:  Negative for cough, chest tightness, shortness of breath and  wheezing.   Cardiovascular:  Negative for chest pain, palpitations and leg swelling.  Gastrointestinal:  Positive for nausea and vomiting. Negative for abdominal pain, blood in stool, constipation and diarrhea.  Endocrine: Negative.   Genitourinary: Negative.  Negative for difficulty urinating, penile swelling, scrotal swelling and testicular pain.  Musculoskeletal:  Positive for arthralgias. Negative for back pain, joint swelling and myalgias.  Skin: Negative.   Hematological:  Negative for adenopathy. Does not bruise/bleed easily.  Psychiatric/Behavioral:  Positive for decreased concentration and dysphoric mood. Negative for confusion, self-injury, sleep disturbance and suicidal ideas. The patient is not nervous/anxious.    Objective:  BP 104/76 (BP Location: Right Arm, Patient Position: Sitting, Cuff Size: Large)   Pulse 90   Temp 98.4 F (36.9 C)   Resp 16   Ht 6' (1.829 m)   Wt 220 lb (99.8 kg)   SpO2 93%   BMI 29.84 kg/m   BP Readings from Last 3 Encounters:  12/23/21 104/76  11/06/21 114/70  09/29/21 120/67    Wt Readings from Last 3 Encounters:  12/23/21 220 lb (99.8 kg)  11/06/21 223 lb 8 oz (101.4 kg)  09/29/21 238 lb 1.6 oz (108 kg)    Physical Exam Vitals reviewed.  Constitutional:      General: He is not in acute distress.    Appearance: Normal appearance. He is not toxic-appearing.  HENT:  Nose: Nose normal.     Mouth/Throat:     Mouth: Mucous membranes are moist.     Pharynx: No oropharyngeal exudate.  Eyes:     General: No scleral icterus.    Conjunctiva/sclera: Conjunctivae normal.  Cardiovascular:     Rate and Rhythm: Normal rate and regular rhythm.     Heart sounds: Normal heart sounds, S1 normal and S2 normal. No murmur heard.    Comments: EKG- NSR with 2nd degree AV block (Mobitz II). 64 bpm - new Incomplete RBBB - old No LVH or Q waves Pulmonary:     Effort: Pulmonary effort is normal.     Breath sounds: No stridor. No wheezing, rhonchi  or rales.  Abdominal:     General: Abdomen is flat.     Palpations: There is no mass.     Tenderness: There is no abdominal tenderness. There is no guarding.     Hernia: No hernia is present. There is no hernia in the left inguinal area or right inguinal area.  Genitourinary:    Pubic Area: No rash.      Penis: Normal and circumcised.      Testes: Normal.     Epididymis:     Right: Normal.     Left: Normal.     Prostate: Not enlarged, not tender and no nodules present.     Rectum: Normal. Guaiac result negative. No mass, tenderness, anal fissure, external hemorrhoid or internal hemorrhoid. Normal anal tone.  Musculoskeletal:        General: Deformity (djd) present. No swelling.     Right lower leg: No edema.     Left lower leg: No edema.  Lymphadenopathy:     Lower Body: No right inguinal adenopathy. No left inguinal adenopathy.  Skin:    General: Skin is warm and dry.     Findings: No rash.  Neurological:     General: No focal deficit present.     Mental Status: He is alert. Mental status is at baseline.  Psychiatric:        Mood and Affect: Mood normal.        Behavior: Behavior normal.        Thought Content: Thought content normal.        Judgment: Judgment normal.    Lab Results  Component Value Date   WBC 6.2 12/23/2021   HGB 14.1 12/23/2021   HCT 41.3 12/23/2021   PLT 207.0 12/23/2021   GLUCOSE 76 12/23/2021   CHOL 139 12/23/2021   TRIG 153.0 (H) 12/23/2021   HDL 88.90 12/23/2021   LDLDIRECT 111.0 05/30/2019   LDLCALC 20 12/23/2021   ALT 13 12/23/2021   AST 18 12/23/2021   NA 136 12/23/2021   K 3.3 (L) 12/23/2021   CL 94 (L) 12/23/2021   CREATININE 1.31 12/23/2021   BUN 20 12/23/2021   CO2 28 12/23/2021   TSH 1.69 12/23/2021   PSA 2.16 12/23/2021   INR 1.02 07/29/2018   HGBA1C 5.1 10/09/2019    No results found.  Assessment & Plan:   Henry Figueroa was seen today for hypertension and hyperlipidemia.  Diagnoses and all orders for this  visit:  Essential hypertension, benign- His blood pressure is overcontrolled but fortunately he is asymptomatic.  I do not want to abruptly stop the beta-blocker due to concerns about rebound tachycardia.  I have asked him to decrease the carvedilol dose by 50% and to stop taking the ARB and thiazide diuretic. -     Basic  metabolic panel; Future -     CBC with Differential/Platelet; Future -     TSH; Future -     Urinalysis, Routine w reflex microscopic; Future -     Hepatic function panel; Future -     Cortisol; Future -     EKG 12-Lead -     carvedilol (COREG) 6.25 MG tablet; Take 1 tablet (6.25 mg total) by mouth 2 (two) times daily with a meal. -     Cortisol -     Hepatic function panel -     Urinalysis, Routine w reflex microscopic -     TSH -     CBC with Differential/Platelet -     Basic metabolic panel -     potassium chloride SA (KLOR-CON M15) 15 MEQ tablet; Take 1 tablet (15 mEq total) by mouth 3 (three) times daily.  Benign prostatic hyperplasia without lower urinary tract symptoms- His PSA is normal. -     PSA; Future -     PSA  Routine general medical examination at a health care facility  Primary hypertriglyceridemia- Improvement noted. -     Lipid panel; Future -     Hepatic function panel; Future -     Hepatic function panel -     Lipid panel  Hyperlipidemia with target LDL less than 160- LDL goal achieved. Doing well on the statin  -     Lipid panel; Future -     TSH; Future -     Hepatic function panel; Future -     Hepatic function panel -     TSH -     Lipid panel  Current severe episode of major depressive disorder without psychotic features without prior episode ([place])- Labs are negative for secondary causes. -     TSH; Future -     TSH  Need for prophylactic vaccination with combined diphtheria-tetanus-pertussis (DTP) vaccine -     Zoster Vaccine Adjuvanted [facility]) injection; Inject 0.5 mLs into the muscle once for 1 dose.  Hypotension,  unspecified hypotension type- Labs are reassuring.  I have discontinued 2 antihypertensives and have decreased the dose of the beta-blocker by 50%.  This may be related to Mobitz type II so I have asked him to see cardiology as soon as possible. -     Cortisol; Future -     Cortisol  Mobitz type 2 second degree AV block- I asked him to report directly to the ED if he develops any symptoms related to this.  Will decrease the dose of carvedilol by 50%. -     Cancel: Ambulatory referral to Cardiology -     Ambulatory referral to Cardiology  Diuretic-induced hypokalemia -     potassium chloride SA (KLOR-CON M15) 15 MEQ tablet; Take 1 tablet (15 mEq total) by mouth 3 (three) times daily.   I have discontinued Henry Figueroa's carvedilol and olmesartan. I am also having him start on Shingrix, carvedilol, and potassium chloride SA. Additionally, I am having him maintain his acetaminophen, rosuvastatin, OVER THE COUNTER MEDICATION, sucralfate, ondansetron, and pantoprazole.  Meds ordered this encounter  Medications   Zoster Vaccine Adjuvanted [facility]) injection    Sig: Inject 0.5 mLs into the muscle once for 1 dose.    Dispense:  0.5 mL    Refill:  1   carvedilol (COREG) 6.25 MG tablet    Sig: Take 1 tablet (6.25 mg total) by mouth 2 (two) times daily with a meal.    Dispense:  60 tablet    Refill:  0   potassium chloride SA (KLOR-CON M15) 15 MEQ tablet    Sig: Take 1 tablet (15 mEq total) by mouth 3 (three) times daily.    Dispense:  90 tablet    Refill:  1   I spent 45 minutes in preparing to see the patient by review of recent GI visits, obtaining and reviewing separately obtained history, communicating with the patient, ordering medications and a referral, and documenting clinical information in the EHR including the differential Dx, treatment, and any further evaluation and management of multiple complex medical issues.     Follow-up: Return in about 4 weeks (around  01/20/2022).  Scarlette Calico, MD

## 2021-12-23 NOTE — Patient Instructions (Signed)
Second-Degree Atrioventricular Block Second-degree atrioventricular (AV) block is a condition that can cause the heart to miss beats. In this condition, the electrical signals that travel from the heart's upper chambers (atria) to its lower chambers (ventricles) move too slowly or are interrupted. There are two types of second-degree AV block: Mobitz type 1 block. This type does not cause symptoms. It rarely requires treatment. Mobitz type 2 block. This type is more serious. It results in more missed beats and a very irregular heartbeat. It can lead to third-degree AV block, or complete heart block, meaning that no signal exists between the atria and ventricles. Mobitz type 2 is a dangerous condition that can lead to fainting or cardiac arrest. Mobitz type 2 block often requires a pacemaker. What are the causes? This condition may be caused by: Any condition that damages the electrical pathway that controls the heart's rate and rhythm, such as a heart attack or infection of the heart. Overstimulation of the nerve that slows down the heart rate (vagus nerve). This cause is common among well-conditioned athletes. Some medicines that slow down the heart rate, such as beta blockers or calcium channel blockers. Surgery that damages the heart. Some people are born with this condition (congenital heart block), but most people develop it over time. What increases the risk? The risk for this condition increases with age. You are also more likely to develop this condition if you have: A history of heart attack. Heart failure. Coronary heart disease. Inflammation of heart muscle (myocarditis). Disease of heart muscle (cardiomyopathy). Infection of the heart valves (endocarditis). Infections or diseases that affect the heart. These include: Lyme disease. Sarcoidosis. Hemochromatosis. Rheumatic fever. Certain muscle disorders. Babies are more likely to be born with heart block if: The baby's mother has  an autoimmune disease, such as lupus. The baby is born with a heart defect that affects the heart's structure. A parent was born with a heart defect. What are the signs or symptoms? Symptoms of this condition include: Tiredness. Shortness of breath. Dizziness. Light-headedness. Fainting. Chest pain. How is this diagnosed? This condition may be diagnosed based on: A physical exam. Your medical history. A measurement of your pulse or heartbeat. Tests. These may include: An electrocardiogram (ECG). This checks for problems with electrical activity in your heart. Ambulatory cardiac monitoring. This is a portable ECG that you wear. It checks your heart's rhythm. An electrophysiology (EP) study. Long, thin tubes (catheters) are placed in your heart. The catheters give information about your heart's electrical signals. How is this treated? Treatment for this condition depends on the type of block you have and how severe it is. Treatment may involve: Treating an underlying condition, such as heart disease. Changing or stopping any heart medicines that may have caused heart block. Having a permanent pacemaker placed in your chest. A pacemaker uses electrical pulses to help the heart beat normally. It is usually placed under the skin on your chest. You will likely need a pacemaker if you have Mobitz type 2 block. Follow these instructions at home: Alcohol use Do not drink alcohol if: Your health care provider tells you not to drink. You are pregnant, may be pregnant, or are planning to become pregnant. If you drink alcohol: Limit how much you use to: 0-1 drink a day for women. 0-2 drinks a day for men. Be aware of how much alcohol is in your drink. In the [place] one drink equals one 12 oz bottle of beer (355 mL), one 5 oz glass of   wine (148 mL), or one 1 oz glass of hard liquor (44 mL). General instructions  Take over-the-counter and prescription medicines only as told by your health care  provider. Follow your health care provider's recommendations to help reduce your risk of heart disease. These may include: Exercising for at least 30 minutes on 5 or more days each week (150 minutes). Ask your health care provider what type of exercise is safe for you. Eating a heart-healthy diet with fruits and vegetables, whole grains, low-fat dairy products, and lean proteins like poultry and eggs. Your health care provider or dietitian can help you make healthy choices. Maintaining a healthy weight. Do not use any products that contain nicotine or tobacco, such as cigarettes, e-cigarettes. and chewing tobacco. If you need help quitting, ask your health care provider. Keep all follow-up visits as told by your health care provider. This is important. Where to find more information American Heart Association: www.heart.org National Heart, Lung, and Blood Institute: www.nhlbi.nih.gov Contact a health care provider if you: Feel like your heart is skipping beats. Feel more tired than normal. Have swelling in your lower legs or your feet. Get help right away if you: Have symptoms that change or get worse. Develop new symptoms. Have chest pain, especially if the pain: Feels like crushing or pressure. Spreads to your arms, back, neck, or jaw. Feel short of breath. Feel light-headed or weak. Faint. These symptoms may represent a serious problem that is an emergency. Do not wait to see if the symptoms will go away. Get medical help right away. Call your local emergency services (911 in the [place] Do not drive yourself to the hospital. Summary Second-degree atrioventricular (AV) block is a type of heart block that can cause the heart to miss beats. In this condition, the electrical signals that control heart rate move too slowly or are interrupted. You will likely need a pacemaker if you have the more serious type of AV block (Mobitz type 2 block). Get help right away if you have symptoms that  change or get worse, have chest pain, feel short of breath or light-headed or weak, faint, or develop new symptoms. This information is not intended to replace advice given to you by your health care provider. Make sure you discuss any questions you have with your health care provider. Document Revised: 05/22/2019 Document Reviewed: 05/22/2019 Elsevier Patient Education  2023 [employer]  

## 2021-12-24 ENCOUNTER — Encounter: Payer: Self-pay | Admitting: Internal Medicine

## 2021-12-24 MED ORDER — ROSUVASTATIN CALCIUM 10 MG PO TABS: 10.0000 mg | ORAL_TABLET | Freq: Every day | ORAL | 1 refills | Status: DC

## 2021-12-25 ENCOUNTER — Encounter: Payer: Self-pay | Admitting: Internal Medicine

## 2021-12-26 ENCOUNTER — Encounter (HOSPITAL_BASED_OUTPATIENT_CLINIC_OR_DEPARTMENT_OTHER): Payer: Self-pay

## 2021-12-26 ENCOUNTER — Encounter (HOSPITAL_BASED_OUTPATIENT_CLINIC_OR_DEPARTMENT_OTHER): Payer: Self-pay | Admitting: Cardiology

## 2021-12-26 ENCOUNTER — Ambulatory Visit (INDEPENDENT_AMBULATORY_CARE_PROVIDER_SITE_OTHER): Payer: 59 | Admitting: Cardiology

## 2021-12-26 DIAGNOSIS — E7800 Pure hypercholesterolemia, unspecified: Secondary | ICD-10-CM | POA: Diagnosis not present

## 2021-12-26 DIAGNOSIS — I483 Typical atrial flutter: Secondary | ICD-10-CM

## 2021-12-26 DIAGNOSIS — I441 Atrioventricular block, second degree: Secondary | ICD-10-CM | POA: Diagnosis not present

## 2021-12-26 NOTE — Patient Instructions (Signed)
Medication Instructions:  The current medical regimen is effective;  continue present plan and medications.  *If you need a refill on your cardiac medications before your next appointment, please call your pharmacy*   Follow-Up: At [facility], you and your health needs are our priority.  As part of our continuing mission to provide you with exceptional heart care, we have created designated Provider Care Teams.  These Care Teams include your primary Cardiologist (physician) and Advanced Practice Providers (APPs -  Physician Assistants and Nurse Practitioners) who all work together to provide you with the care you need, when you need it.  We recommend signing up for the patient portal called "MyChart".  Sign up information is provided on this After Visit Summary.  MyChart is used to connect with patients for Virtual Visits (Telemedicine).  Patients are able to view lab/test results, encounter notes, upcoming appointments, etc.  Non-urgent messages can be sent to your provider as well.   To learn more about what you can do with MyChart, go to [email or web address].    Your next appointment:   5 month(s) - due 05/2022  The format for your next appointment:   In Person  Provider:   Candee Furbish, MD {   Important Information About Sugar
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
## 2021-12-26 NOTE — Assessment & Plan Note (Signed)
Post ablation.  Dr. Rayann Heman.  He is now off of Xarelto.

## 2021-12-26 NOTE — Assessment & Plan Note (Signed)
Upon close review of EKG performed on 12/23/2021, PR interval does shorten upon reset in the middle of the EKG strip.  This would be indicative of Mobitz type I second-degree heart block.  Thankfully, this is usually benign.  He is not having any symptoms.  No syncope no shortness of breath, no prolonged bradycardia.  He has had a prior flutter ablation.  Obviously if symptoms do arise that are concerning such as syncope, pacemaker may be warranted in the future.  For now, I think continuation of current therapy is reasonable.  If overall average heart rate were significantly decreased, we would discontinue carvedilol 6.25 mg twice a day.  Currently on fairly low-dose.

## 2021-12-26 NOTE — Assessment & Plan Note (Signed)
Continue with Crestor 10 mg once a day.  LDL 20.  Excellent.

## 2021-12-28 ENCOUNTER — Other Ambulatory Visit: Payer: Self-pay | Admitting: Internal Medicine

## 2021-12-28 DIAGNOSIS — I10 Essential (primary) hypertension: Secondary | ICD-10-CM

## 2021-12-29 ENCOUNTER — Encounter: Payer: Self-pay | Admitting: Gastroenterology

## 2021-12-30 ENCOUNTER — Other Ambulatory Visit: Payer: Self-pay

## 2021-12-30 ENCOUNTER — Telehealth: Payer: Self-pay

## 2021-12-30 MED ORDER — SUCRALFATE 1 G PO TABS: 1.0000 g | ORAL_TABLET | Freq: Every day | ORAL | 0 refills | Status: DC

## 2021-12-30 MED ORDER — ONDANSETRON HCL 4 MG PO TABS: 4.0000 mg | ORAL_TABLET | Freq: Four times a day (QID) | ORAL | 5 refills | Status: DC | PRN

## 2021-12-30 NOTE — Telephone Encounter (Signed)
error 

## 2021-12-30 NOTE — Telephone Encounter (Signed)
Okay to refill as previously prescribed.  Thank you

## 2022-01-16 ENCOUNTER — Other Ambulatory Visit: Payer: Self-pay | Admitting: Internal Medicine

## 2022-01-16 DIAGNOSIS — I10 Essential (primary) hypertension: Secondary | ICD-10-CM

## 2022-01-29 ENCOUNTER — Other Ambulatory Visit: Payer: Self-pay | Admitting: Internal Medicine

## 2022-01-29 DIAGNOSIS — I10 Essential (primary) hypertension: Secondary | ICD-10-CM

## 2022-02-16 ENCOUNTER — Encounter: Payer: Self-pay | Admitting: Internal Medicine

## 2022-02-27 ENCOUNTER — Other Ambulatory Visit: Payer: Self-pay | Admitting: Gastroenterology

## 2022-03-05 ENCOUNTER — Other Ambulatory Visit: Payer: Self-pay | Admitting: Internal Medicine

## 2022-03-05 DIAGNOSIS — I10 Essential (primary) hypertension: Secondary | ICD-10-CM

## 2022-03-06 ENCOUNTER — Ambulatory Visit (INDEPENDENT_AMBULATORY_CARE_PROVIDER_SITE_OTHER): Payer: 59

## 2022-03-06 DIAGNOSIS — Z0000 Encounter for general adult medical examination without abnormal findings: Secondary | ICD-10-CM | POA: Diagnosis not present

## 2022-03-06 DIAGNOSIS — Z1211 Encounter for screening for malignant neoplasm of colon: Secondary | ICD-10-CM | POA: Diagnosis not present
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
## 2022-03-06 NOTE — Patient Instructions (Signed)
[name] , Thank you for taking time to come for your Medicare Wellness Visit. I appreciate your ongoing commitment to your health goals. Please review the following plan we discussed and let me know if I can assist you in the future.   Screening recommendations/referrals: Colonoscopy: referral 03/06/2022 Recommended yearly ophthalmology/optometry visit for glaucoma screening and checkup Recommended yearly dental visit for hygiene and checkup  Vaccinations: Influenza vaccine: completed  Pneumococcal vaccine: completed  Tdap vaccine: 10/29/2014 Shingles vaccine: completed     Advanced directives: yes   Conditions/risks identified: none   Next appointment: none   Preventive Care 67 Years and Older, Male Preventive care refers to lifestyle choices and visits with your health care provider that can promote health and wellness. What does preventive care include? A yearly physical exam. This is also called an annual well check. Dental exams once or twice a year. Routine eye exams. Ask your health care provider how often you should have your eyes checked. Personal lifestyle choices, including: Daily care of your teeth and gums. Regular physical activity. Eating a healthy diet. Avoiding tobacco and drug use. Limiting alcohol use. Practicing safe sex. Taking low doses of aspirin every day. Taking vitamin and mineral supplements as recommended by your health care provider. What happens during an annual well check? The services and screenings done by your health care provider during your annual well check will depend on your age, overall health, lifestyle risk factors, and family history of disease. Counseling  Your health care provider may ask you questions about your: Alcohol use. Tobacco use. Drug use. Emotional well-being. Home and relationship well-being. Sexual activity. Eating habits. History of falls. Memory and ability to understand (cognition). Work and work  Statistician. Screening  You may have the following tests or measurements: Height, weight, and BMI. Blood pressure. Lipid and cholesterol levels. These may be checked every 5 years, or more frequently if you are over 67 years old. Skin check. Lung cancer screening. You may have this screening every year starting at age 67 if you have a 30-pack-year history of smoking and currently smoke or have quit within the past 15 years. Fecal occult blood test (FOBT) of the stool. You may have this test every year starting at age 67. Flexible sigmoidoscopy or colonoscopy. You may have a sigmoidoscopy every 5 years or a colonoscopy every 10 years starting at age 67. Prostate cancer screening. Recommendations will vary depending on your family history and other risks. Hepatitis C blood test. Hepatitis B blood test. Sexually transmitted disease (STD) testing. Diabetes screening. This is done by checking your blood sugar (glucose) after you have not eaten for a while (fasting). You may have this done every 1-3 years. Abdominal aortic aneurysm (AAA) screening. You may need this if you are a current or former smoker. Osteoporosis. You may be screened starting at age 65 if you are at high risk. Talk with your health care provider about your test results, treatment options, and if necessary, the need for more tests. Vaccines  Your health care provider may recommend certain vaccines, such as: Influenza vaccine. This is recommended every year. Tetanus, diphtheria, and acellular pertussis (Tdap, Td) vaccine. You may need a Td booster every 10 years. Zoster vaccine. You may need this after age 67. Pneumococcal 13-valent conjugate (PCV13) vaccine. One dose is recommended after age 67. Pneumococcal polysaccharide (PPSV23) vaccine. One dose is recommended after age 67. Talk to your health care provider about which screenings and vaccines you need and how often you  need them. This information is not intended to replace  advice given to you by your health care provider. Make sure you discuss any questions you have with your health care provider. Document Released: 08/09/2015 Document Revised: 04/01/2016 Document Reviewed: 05/14/2015 Elsevier Interactive Patient Education  2017 [employer] Prevention in the Home Falls can cause injuries. They can happen to people of all ages. There are many things you can do to make your home safe and to help prevent falls. What can I do on the outside of my home? Regularly fix the edges of walkways and driveways and fix any cracks. Remove anything that might make you trip as you walk through a door, such as a raised step or threshold. Trim any bushes or trees on the path to your home. Use bright outdoor lighting. Clear any walking paths of anything that might make someone trip, such as rocks or tools. Regularly check to see if handrails are loose or broken. Make sure that both sides of any steps have handrails. Any raised decks and porches should have guardrails on the edges. Have any leaves, snow, or ice cleared regularly. Use sand or salt on walking paths during winter. Clean up any spills in your garage right away. This includes oil or grease spills. What can I do in the bathroom? Use night lights. Install grab bars by the toilet and in the tub and shower. Do not use towel bars as grab bars. Use non-skid mats or decals in the tub or shower. If you need to sit down in the shower, use a plastic, non-slip stool. Keep the floor dry. Clean up any water that spills on the floor as soon as it happens. Remove soap buildup in the tub or shower regularly. Attach bath mats securely with double-sided non-slip rug tape. Do not have throw rugs and other things on the floor that can make you trip. What can I do in the bedroom? Use night lights. Make sure that you have a light by your bed that is easy to reach. Do not use any sheets or blankets that are too big for your bed.  They should not hang down onto the floor. Have a firm chair that has side arms. You can use this for support while you get dressed. Do not have throw rugs and other things on the floor that can make you trip. What can I do in the kitchen? Clean up any spills right away. Avoid walking on wet floors. Keep items that you use a lot in easy-to-reach places. If you need to reach something above you, use a strong step stool that has a grab bar. Keep electrical cords out of the way. Do not use floor polish or wax that makes floors slippery. If you must use wax, use non-skid floor wax. Do not have throw rugs and other things on the floor that can make you trip. What can I do with my stairs? Do not leave any items on the stairs. Make sure that there are handrails on both sides of the stairs and use them. Fix handrails that are broken or loose. Make sure that handrails are as long as the stairways. Check any carpeting to make sure that it is firmly attached to the stairs. Fix any carpet that is loose or worn. Avoid having throw rugs at the top or bottom of the stairs. If you do have throw rugs, attach them to the floor with carpet tape. Make sure that you have a light switch  at the top of the stairs and the bottom of the stairs. If you do not have them, ask someone to add them for you. What else can I do to help prevent falls? Wear shoes that: Do not have high heels. Have rubber bottoms. Are comfortable and fit you well. Are closed at the toe. Do not wear sandals. If you use a stepladder: Make sure that it is fully opened. Do not climb a closed stepladder. Make sure that both sides of the stepladder are locked into place. Ask someone to hold it for you, if possible. Clearly mark and make sure that you can see: Any grab bars or handrails. First and last steps. Where the edge of each step is. Use tools that help you move around (mobility aids) if they are needed. These  include: Canes. Walkers. Scooters. Crutches. Turn on the lights when you go into a dark area. Replace any light bulbs as soon as they burn out. Set up your furniture so you have a clear path. Avoid moving your furniture around. If any of your floors are uneven, fix them. If there are any pets around you, be aware of where they are. Review your medicines with your doctor. Some medicines can make you feel dizzy. This can increase your chance of falling. Ask your doctor what other things that you can do to help prevent falls. This information is not intended to replace advice given to you by your health care provider. Make sure you discuss any questions you have with your health care provider. Document Released: 05/09/2009 Document Revised: 12/19/2015 Document Reviewed: 08/17/2014 Elsevier Interactive Patient Education  2017 [employer].

## 2022-04-02 ENCOUNTER — Encounter: Payer: Self-pay | Admitting: Internal Medicine

## 2022-04-06 ENCOUNTER — Ambulatory Visit (INDEPENDENT_AMBULATORY_CARE_PROVIDER_SITE_OTHER): Payer: 59 | Admitting: Internal Medicine

## 2022-04-06 VITALS — BP 146/90 | HR 87 | Temp 98.4°F | Ht 72.0 in | Wt 232.0 lb

## 2022-04-06 DIAGNOSIS — I10 Essential (primary) hypertension: Secondary | ICD-10-CM | POA: Diagnosis not present

## 2022-04-06 DIAGNOSIS — E559 Vitamin D deficiency, unspecified: Secondary | ICD-10-CM

## 2022-04-06 DIAGNOSIS — I872 Venous insufficiency (chronic) (peripheral): Secondary | ICD-10-CM | POA: Diagnosis not present

## 2022-04-06 MED ORDER — HYDROCHLOROTHIAZIDE 25 MG PO TABS: 25.0000 mg | ORAL_TABLET | Freq: Every day | ORAL | 3 refills | Status: DC | PRN

## 2022-04-06 NOTE — Patient Instructions (Signed)
Please take all new medication as prescribed - the fluid pill as needed  Please continue all other medications as before, and refills have been done if requested.  Please have the pharmacy call with any other refills you may need.  Please continue your efforts at being more active, low cholesterol diet, and weight control.  Please keep your appointments with your specialists as you may have planned
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
## 2022-04-07 DIAGNOSIS — I872 Venous insufficiency (chronic) (peripheral): Secondary | ICD-10-CM | POA: Insufficient documentation

## 2022-04-07 NOTE — Assessment & Plan Note (Signed)
BP Readings from Last 3 Encounters:  04/06/22 (!) 146/90  12/26/21 (!) 86/60  12/23/21 104/76   Uncontrolled, to add hct 25 mg qd, follow K closely, pt to continue medical treatment coreg 6.25 bid

## 2022-04-07 NOTE — Assessment & Plan Note (Signed)
D/w pt, for leg elevation, low Na diet, exercise, wt control, compression stockings

## 2022-04-07 NOTE — Assessment & Plan Note (Signed)
Last vitamin D Lab Results  Component Value Date   VD25OH 45.29 06/10/2020   Stable, cont oral replacement

## 2022-04-09 ENCOUNTER — Telehealth: Payer: Self-pay | Admitting: Internal Medicine

## 2022-04-09 NOTE — Telephone Encounter (Signed)
Patient broke a tooth off at the gumline - He can not get an appointment till next week.  He would like to get an antibiotic until he can be seen - Patient would like to have one [facility] to [facility] on [address]

## 2022-04-10 ENCOUNTER — Other Ambulatory Visit: Payer: Self-pay | Admitting: Internal Medicine

## 2022-04-10 DIAGNOSIS — K047 Periapical abscess without sinus: Secondary | ICD-10-CM | POA: Insufficient documentation

## 2022-04-10 MED ORDER — AMOXICILLIN-POT CLAVULANATE 875-125 MG PO TABS: 1.0000 | ORAL_TABLET | Freq: Two times a day (BID) | ORAL | 0 refills | Status: AC

## 2022-05-11 ENCOUNTER — Emergency Department (HOSPITAL_COMMUNITY): Payer: Self-pay

## 2022-05-11 ENCOUNTER — Emergency Department (HOSPITAL_COMMUNITY)
Admission: EM | Admit: 2022-05-11 | Discharge: 2022-05-11 | Disposition: A | Discharge status: Home / Self Care | Payer: Self-pay | Attending: Emergency Medicine | Admitting: Emergency Medicine

## 2022-05-11 DIAGNOSIS — I4891 Unspecified atrial fibrillation: Secondary | ICD-10-CM | POA: Insufficient documentation

## 2022-05-11 DIAGNOSIS — W010XXA Fall on same level from slipping, tripping and stumbling without subsequent striking against object, initial encounter: Secondary | ICD-10-CM | POA: Insufficient documentation

## 2022-05-11 DIAGNOSIS — I10 Essential (primary) hypertension: Secondary | ICD-10-CM | POA: Insufficient documentation

## 2022-05-11 DIAGNOSIS — Z85038 Personal history of other malignant neoplasm of large intestine: Secondary | ICD-10-CM | POA: Insufficient documentation

## 2022-05-11 DIAGNOSIS — S0101XA Laceration without foreign body of scalp, initial encounter: Secondary | ICD-10-CM | POA: Insufficient documentation

## 2022-05-11 DIAGNOSIS — Z23 Encounter for immunization: Secondary | ICD-10-CM | POA: Insufficient documentation

## 2022-05-11 DIAGNOSIS — Z87891 Personal history of nicotine dependence: Secondary | ICD-10-CM | POA: Insufficient documentation

## 2022-05-11 MED ORDER — CARVEDILOL 12.5 MG PO TABS
12.5000 mg | ORAL_TABLET | Freq: Once | ORAL | Status: AC
  Administered 2022-05-11: 12.5 mg via ORAL
  Filled 2022-05-11: qty 1

## 2022-05-11 MED ORDER — LIDOCAINE HCL 2 % IJ SOLN
INTRAMUSCULAR | Status: AC
  Administered 2022-05-11: 400 mg
  Filled 2022-05-11: qty 20

## 2022-05-11 MED ORDER — TETANUS-DIPHTH-ACELL PERTUSSIS 5-2.5-18.5 LF-MCG/0.5 IM SUSY
0.5000 mL | PREFILLED_SYRINGE | Freq: Once | INTRAMUSCULAR | Status: AC
  Administered 2022-05-11: 0.5 mL via INTRAMUSCULAR
  Filled 2022-05-11: qty 0.5

## 2022-05-11 MED ORDER — BACITRACIN ZINC 500 UNIT/GM EX OINT: 1.0000 | TOPICAL_OINTMENT | Freq: Two times a day (BID) | CUTANEOUS | 0 refills | Status: DC

## 2022-05-11 MED ORDER — LIDOCAINE HCL 2 % IJ SOLN: 20.0000 mL | Freq: Once | INTRAMUSCULAR | Status: AC
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
## 2022-05-11 NOTE — Discharge Instructions (Addendum)
We evaluated you after your fall.  You sustained a laceration to your scalp which we repaired with staples.  Please apply antibiotic ointment twice daily.  It is okay to shower, do not scrub at the wound, but you can let soapy water rinse over it.  Your head CT was negative for any bleeding inside your brain or skull fractures.  Please keep a close eye on your wound.  Please have the staples removed in 10 to 14 days.  You can see your primary doctor, go to urgent care, or return to the emergency department for this.  Please return to the emergency department if you develop any symptoms such as redness in the wound, increasing pain, warmth, or drainage of pus as this may be sign of an infection.  We noticed your blood pressure he was very high.  You did not have any symptoms of a dangerous complication of high blood pressure.  However, should follow-up closely with your primary physician as you may need adjustments in your blood pressure medication.  Some of this may be due to pain or being in the emergency department, but the level was still very high.  Please return if you experience chest pain, shortness of breath, headaches, vision changes, back pain, nausea or vomiting, or any other concerning symptoms.  Please continue to take your blood pressure medications as prescribed.

## 2022-05-11 NOTE — ED Triage Notes (Signed)
Patient BIB [facility] from home after tripping and hitting head on the corner inside his house. Patient reports cats got him tangled up and he tripped. No LOC, no thinners. Crescent shaped lac to top of head reported by EMS. Patient ambulated to bedside.

## 2022-05-11 NOTE — Progress Notes (Signed)
Face and head cleaned. Dry guaze bandage applied. Erling Conte, RN

## 2022-05-26 ENCOUNTER — Ambulatory Visit (INDEPENDENT_AMBULATORY_CARE_PROVIDER_SITE_OTHER): Payer: 59 | Admitting: Internal Medicine

## 2022-05-26 ENCOUNTER — Encounter: Payer: Self-pay | Admitting: Internal Medicine

## 2022-05-26 VITALS — BP 140/92 | HR 80 | Temp 97.7°F | Ht 72.0 in

## 2022-05-26 DIAGNOSIS — S0101XD Laceration without foreign body of scalp, subsequent encounter: Secondary | ICD-10-CM | POA: Insufficient documentation

## 2022-05-26 DIAGNOSIS — Z23 Encounter for immunization: Secondary | ICD-10-CM

## 2022-05-26 MED ORDER — DOXYCYCLINE HYCLATE 100 MG PO TABS: 100.0000 mg | ORAL_TABLET | Freq: Two times a day (BID) | ORAL | 0 refills | Status: DC

## 2022-05-26 NOTE — Progress Notes (Signed)
Subjective:  Patient ID: Henry Figueroa, male    DOB: [birth year]  Age: 66 y.o. MRN: [id number]  CC: Suture / Staple Removal (Staples removed from forehead)   HPI MYKING SAR presents for 05/11/22 for scalp laceration Staples removal.  The patient sustained a fall at home on 05/11/2022.  He denies loss of consciousness.  He went to ER where the CT scan of he has had a neck was administered.  The wound was closed with 10 staples  Outpatient Medications Prior to Visit  Medication Sig Dispense Refill   acetaminophen (TYLENOL) 500 MG tablet Take 1,000 mg by mouth every 6 (six) hours as needed for moderate pain.     bacitracin ointment Apply 1 Application topically 2 (two) times daily. 120 g 0   carvedilol (COREG) 6.25 MG tablet TAKE 1 TABLET BY MOUTH 2 TIMES DAILY WITH A MEAL. 60 tablet 0   hydrochlorothiazide (HYDRODIURIL) 25 MG tablet Take 1 tablet (25 mg total) by mouth daily as needed. 90 tablet 3   ondansetron (ZOFRAN) 4 MG tablet Take 1 tablet (4 mg total) by mouth every 6 (six) hours as needed for nausea or vomiting. 30 tablet 5   OVER THE COUNTER MEDICATION Apply 1 application topically daily as needed (pain). CBD cream     pantoprazole (PROTONIX) 40 MG tablet TAKE 1 TABLET BY MOUTH TWICE A DAY 180 tablet 1   potassium chloride SA (KLOR-CON M15) 15 MEQ tablet Take 1 tablet (15 mEq total) by mouth 3 (three) times daily. 90 tablet 1   rosuvastatin (CRESTOR) 10 MG tablet Take 1 tablet (10 mg total) by mouth daily. 90 tablet 1   sucralfate (CARAFATE) 1 g tablet TAKE 1 TABLET BY MOUTH DAILY. 60 tablet 1   No facility-administered medications prior to visit.    ROS: Review of Systems  Constitutional:  Negative for chills and fever.  Eyes:  Negative for visual disturbance.  Neurological:  Negative for dizziness and headaches.    Objective:  BP (!) 140/92 (BP Location: Left Arm)   Pulse 80   Temp 97.7 F (36.5 C) (Oral)   Ht 6' (1.829 m)   SpO2 98%   BMI 31.46 kg/m   BP  Readings from Last 3 Encounters:  05/26/22 (!) 140/92  05/11/22 (!) 190/103  04/06/22 (!) 146/90    Wt Readings from Last 3 Encounters:  04/06/22 232 lb (105.2 kg)  12/26/21 221 lb (100.2 kg)  12/23/21 220 lb (99.8 kg)    Physical Exam Constitutional:      Appearance: Normal appearance. He is obese.  HENT:     Head: Normocephalic and atraumatic.     Right Ear: External ear normal.     Left Ear: External ear normal.  Skin:    Findings: No erythema.   There is a U-shaped laceration wound with scab 10 staples were removed.  1 staple had a drop of pus on one end.  Lab Results  Component Value Date   WBC 6.2 12/23/2021   HGB 14.1 12/23/2021   HCT 41.3 12/23/2021   PLT 207.0 12/23/2021   GLUCOSE 76 12/23/2021   CHOL 139 12/23/2021   TRIG 153.0 (H) 12/23/2021   HDL 88.90 12/23/2021   LDLDIRECT 111.0 05/30/2019   LDLCALC 20 12/23/2021   ALT 13 12/23/2021   AST 18 12/23/2021   NA 136 12/23/2021   K 3.3 (L) 12/23/2021   CL 94 (L) 12/23/2021   CREATININE 1.31 12/23/2021   BUN 20 12/23/2021  CO2 28 12/23/2021   TSH 1.69 12/23/2021   PSA 2.16 12/23/2021   INR 1.02 07/29/2018   HGBA1C 5.1 10/09/2019    CT Head Wo Contrast  Result Date: 05/11/2022 CLINICAL DATA:  Trauma hit head laceration EXAM: CT HEAD WITHOUT CONTRAST CT CERVICAL SPINE WITHOUT CONTRAST TECHNIQUE: Multidetector CT imaging of the head and cervical spine was performed following the standard protocol without intravenous contrast. Multiplanar CT image reconstructions of the cervical spine were also generated. RADIATION DOSE REDUCTION: This exam was performed according to the departmental dose-optimization program which includes automated exposure control, adjustment of the mA and/or kV according to patient size and/or use of iterative reconstruction technique. COMPARISON:  None Available. FINDINGS: CT HEAD FINDINGS Brain: No acute territorial infarction, hemorrhage or intracranial mass. Small focus of  encephalomalacia within the right frontal lobe, series 3, image 20. Mild atrophy and chronic small vessel ischemic changes of the white matter. The ventricles are nonenlarged Vascular: No hyperdense vessel or unexpected calcification. Skull: Normal. Negative for fracture or focal lesion. Sinuses/Orbits: Mild mucosal thickening in the sinuses Other: Right forehead scalp laceration CT CERVICAL SPINE FINDINGS Alignment: No subluxation.  Facet alignment within normal limits. Skull base and vertebrae: No acute fracture. No primary bone lesion or focal pathologic process. Soft tissues and spinal canal: No prevertebral fluid or swelling. No visible canal hematoma. Disc levels:  Moderate degenerative changes C5 through C7. Upper chest: Negative. Other: None IMPRESSION: 1. No CT evidence for acute intracranial abnormality. Atrophy and chronic small vessel ischemic changes of the white matter 2. No acute osseous abnormality of the cervical spine Electronically Signed   By: Donavan Foil M.D.   On: 05/11/2022 21:15   CT Cervical Spine Wo Contrast  Result Date: 05/11/2022 CLINICAL DATA:  Trauma hit head laceration EXAM: CT HEAD WITHOUT CONTRAST CT CERVICAL SPINE WITHOUT CONTRAST TECHNIQUE: Multidetector CT imaging of the head and cervical spine was performed following the standard protocol without intravenous contrast. Multiplanar CT image reconstructions of the cervical spine were also generated. RADIATION DOSE REDUCTION: This exam was performed according to the departmental dose-optimization program which includes automated exposure control, adjustment of the mA and/or kV according to patient size and/or use of iterative reconstruction technique. COMPARISON:  None Available. FINDINGS: CT HEAD FINDINGS Brain: No acute territorial infarction, hemorrhage or intracranial mass. Small focus of encephalomalacia within the right frontal lobe, series 3, image 20. Mild atrophy and chronic small vessel ischemic changes of the white  matter. The ventricles are nonenlarged Vascular: No hyperdense vessel or unexpected calcification. Skull: Normal. Negative for fracture or focal lesion. Sinuses/Orbits: Mild mucosal thickening in the sinuses Other: Right forehead scalp laceration CT CERVICAL SPINE FINDINGS Alignment: No subluxation.  Facet alignment within normal limits. Skull base and vertebrae: No acute fracture. No primary bone lesion or focal pathologic process. Soft tissues and spinal canal: No prevertebral fluid or swelling. No visible canal hematoma. Disc levels:  Moderate degenerative changes C5 through C7. Upper chest: Negative. Other: None IMPRESSION: 1. No CT evidence for acute intracranial abnormality. Atrophy and chronic small vessel ischemic changes of the white matter 2. No acute osseous abnormality of the cervical spine Electronically Signed   By: Donavan Foil M.D.   On: 05/11/2022 21:15    Assessment & Plan:   Problem List Items Addressed This Visit     Scalp laceration, subsequent encounter    10 staples were removed.  One staple had a drop of pus on one end. Doxy Rx given to take if the  patient observes pus, pain, redness         Meds ordered this encounter  Medications   doxycycline (VIBRA-TABS) 100 MG tablet    Sig: Take 1 tablet (100 mg total) by mouth 2 (two) times daily.    Dispense:  14 tablet    Refill:  0      Follow-up: No follow-ups on file.  Walker Kehr, MD

## 2022-05-26 NOTE — Assessment & Plan Note (Addendum)
10 staples were removed.  One staple had a drop of pus on one end. Doxy Rx given to take if the patient observes pus, pain, redness Use antibiotic ointment for a few days

## 2022-05-26 NOTE — Addendum Note (Signed)
Addended by: Earnstine Regal on: 05/26/2022 12:11 PM   Modules accepted: Orders

## 2022-06-14 ENCOUNTER — Other Ambulatory Visit: Payer: Self-pay | Admitting: Internal Medicine

## 2022-06-14 DIAGNOSIS — I10 Essential (primary) hypertension: Secondary | ICD-10-CM

## 2022-06-14 DIAGNOSIS — E876 Hypokalemia: Secondary | ICD-10-CM

## 2022-06-24 ENCOUNTER — Other Ambulatory Visit: Payer: Self-pay | Admitting: Internal Medicine

## 2022-06-24 DIAGNOSIS — E785 Hyperlipidemia, unspecified: Secondary | ICD-10-CM

## 2022-06-28 ENCOUNTER — Other Ambulatory Visit: Payer: Self-pay | Admitting: Gastroenterology

## 2022-07-03 ENCOUNTER — Encounter: Payer: Self-pay | Admitting: Internal Medicine

## 2022-07-08 ENCOUNTER — Encounter: Payer: Self-pay | Admitting: Internal Medicine

## 2022-07-08 ENCOUNTER — Ambulatory Visit (INDEPENDENT_AMBULATORY_CARE_PROVIDER_SITE_OTHER): Payer: Medicare Other | Admitting: Internal Medicine

## 2022-07-08 VITALS — BP 184/108 | HR 79 | Temp 97.8°F | Ht 72.0 in | Wt 243.0 lb

## 2022-07-08 DIAGNOSIS — T502X5A Adverse effect of carbonic-anhydrase inhibitors, benzothiadiazides and other diuretics, initial encounter: Secondary | ICD-10-CM

## 2022-07-08 DIAGNOSIS — R809 Proteinuria, unspecified: Secondary | ICD-10-CM

## 2022-07-08 DIAGNOSIS — E876 Hypokalemia: Secondary | ICD-10-CM | POA: Diagnosis not present

## 2022-07-08 DIAGNOSIS — I10 Essential (primary) hypertension: Secondary | ICD-10-CM | POA: Diagnosis not present

## 2022-07-08 LAB — URINALYSIS, ROUTINE W REFLEX MICROSCOPIC
Bilirubin Urine: NEGATIVE
Ketones, ur: NEGATIVE
Leukocytes,Ua: NEGATIVE
Nitrite: NEGATIVE
Specific Gravity, Urine: >=1.03 — AB (ref 1.000–1.030)
Total Protein, Urine: >=300 — AB
Urine Glucose: NEGATIVE
Urobilinogen, UA: 0.2 (ref 0.0–1.0)
pH: 6 (ref 5.0–8.0)

## 2022-07-08 LAB — CBC WITH DIFFERENTIAL/PLATELET
Basophils Absolute: 0.1 10*3/uL (ref 0.0–0.1)
Basophils Relative: 0.9 % (ref 0.0–3.0)
Eosinophils Absolute: 0.1 10*3/uL (ref 0.0–0.7)
Eosinophils Relative: 1.6 % (ref 0.0–5.0)
HCT: 43.8 % (ref 39.0–52.0)
Hemoglobin: 14.8 g/dL (ref 13.0–17.0)
Lymphocytes Relative: 28.8 % (ref 12.0–46.0)
Lymphs Abs: 1.8 10*3/uL (ref 0.7–4.0)
MCHC: 33.9 g/dL (ref 30.0–36.0)
MCV: 95.3 fl (ref 78.0–100.0)
Monocytes Absolute: 0.5 10*3/uL (ref 0.1–1.0)
Monocytes Relative: 7.9 % (ref 3.0–12.0)
Neutro Abs: 3.9 10*3/uL (ref 1.4–7.7)
Neutrophils Relative %: 60.8 % (ref 43.0–77.0)
Platelets: 244 10*3/uL (ref 150.0–400.0)
RBC: 4.59 Mil/uL (ref 4.22–5.81)
RDW: 13.8 % (ref 11.5–15.5)
WBC: 6.4 10*3/uL (ref 4.0–10.5)

## 2022-07-08 LAB — BASIC METABOLIC PANEL
BUN: 16 mg/dL (ref 6–23)
CO2: 34 mEq/L — ABNORMAL HIGH (ref 19–32)
Calcium: 9.8 mg/dL (ref 8.4–10.5)
Chloride: 97 mEq/L (ref 96–112)
Creatinine, Ser: 0.87 mg/dL (ref 0.40–1.50)
GFR: 89.14 mL/min (ref >60.00)
Glucose, Bld: 110 mg/dL — ABNORMAL HIGH (ref 70–99)
Potassium: 3.4 mEq/L — ABNORMAL LOW (ref 3.5–5.1)
Sodium: 140 mEq/L (ref 135–145)

## 2022-07-08 LAB — MAGNESIUM: Magnesium: 1.7 mg/dL (ref 1.5–2.5)

## 2022-07-08 LAB — HEPATIC FUNCTION PANEL
ALT: 24 U/L (ref 0–53)
AST: 22 U/L (ref 0–37)
Albumin: 4.6 g/dL (ref 3.5–5.2)
Alkaline Phosphatase: 55 U/L (ref 39–117)
Bilirubin, Direct: 0.2 mg/dL (ref 0.0–0.3)
Total Bilirubin: 0.6 mg/dL (ref 0.2–1.2)
Total Protein: 7.5 g/dL (ref 6.0–8.3)

## 2022-07-08 MED ORDER — OLMESARTAN MEDOXOMIL 20 MG PO TABS: 20.0000 mg | ORAL_TABLET | Freq: Every day | ORAL | 0 refills | Status: DC

## 2022-07-08 NOTE — Patient Instructions (Signed)
Hypertension, Adult High blood pressure (hypertension) is when the force of blood pumping through the arteries is too strong. The arteries are the blood vessels that carry blood from the heart throughout the body. Hypertension forces the heart to work harder to pump blood and may cause arteries to become narrow or stiff. Untreated or uncontrolled hypertension can lead to a heart attack, heart failure, a stroke, kidney disease, and other problems. A blood pressure reading consists of a higher number over a lower number. Ideally, your blood pressure should be below 120/80. The first ("top") number is called the systolic pressure. It is a measure of the pressure in your arteries as your heart beats. The second ("bottom") number is called the diastolic pressure. It is a measure of the pressure in your arteries as the heart relaxes. What are the causes? The exact cause of this condition is not known. There are some conditions that result in high blood pressure. What increases the risk? Certain factors may make you more likely to develop high blood pressure. Some of these risk factors are under your control, including: Smoking. Not getting enough exercise or physical activity. Being overweight. Having too much fat, sugar, calories, or salt (sodium) in your diet. Drinking too much alcohol. Other risk factors include: Having a personal history of heart disease, diabetes, high cholesterol, or kidney disease. Stress. Having a family history of high blood pressure and high cholesterol. Having obstructive sleep apnea. Age. The risk increases with age. What are the signs or symptoms? High blood pressure may not cause symptoms. Very high blood pressure (hypertensive crisis) may cause: Headache. Fast or irregular heartbeats (palpitations). Shortness of breath. Nosebleed. Nausea and vomiting. Vision changes. Severe chest pain, dizziness, and seizures. How is this diagnosed? This condition is diagnosed by  measuring your blood pressure while you are seated, with your arm resting on a flat surface, your legs uncrossed, and your feet flat on the floor. The cuff of the blood pressure monitor will be placed directly against the skin of your upper arm at the level of your heart. Blood pressure should be measured at least twice using the same arm. Certain conditions can cause a difference in blood pressure between your right and left arms. If you have a high blood pressure reading during one visit or you have normal blood pressure with other risk factors, you may be asked to: Return on a different day to have your blood pressure checked again. Monitor your blood pressure at home for 1 week or longer. If you are diagnosed with hypertension, you may have other blood or imaging tests to help your health care provider understand your overall risk for other conditions. How is this treated? This condition is treated by making healthy lifestyle changes, such as eating healthy foods, exercising more, and reducing your alcohol intake. You may be referred for counseling on a healthy diet and physical activity. Your health care provider may prescribe medicine if lifestyle changes are not enough to get your blood pressure under control and if: Your systolic blood pressure is above 130. Your diastolic blood pressure is above 80. Your personal target blood pressure may vary depending on your medical conditions, your age, and other factors. Follow these instructions at home: Eating and drinking  Eat a diet that is high in fiber and potassium, and low in sodium, added sugar, and fat. An example of this eating plan is called the DASH diet. DASH stands for Dietary Approaches to Stop Hypertension. To eat this way: Eat   plenty of fresh fruits and vegetables. Try to fill one half of your plate at each meal with fruits and vegetables. Eat whole grains, such as whole-wheat pasta, brown rice, or whole-grain bread. Fill about one  fourth of your plate with whole grains. Eat or drink low-fat dairy products, such as skim milk or low-fat yogurt. Avoid fatty cuts of meat, processed or cured meats, and poultry with skin. Fill about one fourth of your plate with lean proteins, such as fish, chicken without skin, beans, eggs, or tofu. Avoid pre-made and processed foods. These tend to be higher in sodium, added sugar, and fat. Reduce your daily sodium intake. Many people with hypertension should eat less than 1,500 mg of sodium a day. Do not drink alcohol if: Your health care provider tells you not to drink. You are pregnant, may be pregnant, or are planning to become pregnant. If you drink alcohol: Limit how much you have to: 0-1 drink a day for women. 0-2 drinks a day for men. Know how much alcohol is in your drink. In the [place] one drink equals one 12 oz bottle of beer (355 mL), one 5 oz glass of wine (148 mL), or one 1 oz glass of hard liquor (44 mL). Lifestyle  Work with your health care provider to maintain a healthy body weight or to lose weight. Ask what an ideal weight is for you. Get at least 30 minutes of exercise that causes your heart to beat faster (aerobic exercise) most days of the week. Activities may include walking, swimming, or biking. Include exercise to strengthen your muscles (resistance exercise), such as Pilates or lifting weights, as part of your weekly exercise routine. Try to do these types of exercises for 30 minutes at least 3 days a week. Do not use any products that contain nicotine or tobacco. These products include cigarettes, chewing tobacco, and vaping devices, such as e-cigarettes. If you need help quitting, ask your health care provider. Monitor your blood pressure at home as told by your health care provider. Keep all follow-up visits. This is important. Medicines Take over-the-counter and prescription medicines only as told by your health care provider. Follow directions carefully. Blood  pressure medicines must be taken as prescribed. Do not skip doses of blood pressure medicine. Doing this puts you at risk for problems and can make the medicine less effective. Ask your health care provider about side effects or reactions to medicines that you should watch for. Contact a health care provider if you: Think you are having a reaction to a medicine you are taking. Have headaches that keep coming back (recurring). Feel dizzy. Have swelling in your ankles. Have trouble with your vision. Get help right away if you: Develop a severe headache or confusion. Have unusual weakness or numbness. Feel faint. Have severe pain in your chest or abdomen. Vomit repeatedly. Have trouble breathing. These symptoms may be an emergency. Get help right away. Call 911. Do not wait to see if the symptoms will go away. Do not drive yourself to the hospital. Summary Hypertension is when the force of blood pumping through your arteries is too strong. If this condition is not controlled, it may put you at risk for serious complications. Your personal target blood pressure may vary depending on your medical conditions, your age, and other factors. For most people, a normal blood pressure is less than 120/80. Hypertension is treated with lifestyle changes, medicines, or a combination of both. Lifestyle changes include losing weight, eating a healthy,   low-sodium diet, exercising more, and limiting alcohol. This information is not intended to replace advice given to you by your health care provider. Make sure you discuss any questions you have with your health care provider. Document Revised: 05/20/2021 Document Reviewed: 05/20/2021 [employer] Patient Education  2023 [employer]  

## 2022-07-08 NOTE — Progress Notes (Signed)
Subjective:  Patient ID: Henry Figueroa, male    DOB: 07-27-1956  Age: 66 y.o. MRN: [id number]  CC: Hypertension   HPI CHIN WACHTER presents for f/up -  He tells me he is compliant with his antihypertensives.  He is not very active because of musculoskeletal pain and neuropathy.  He denies headache, blurred vision, chest pain, shortness of breath, or edema.  Outpatient Medications Prior to Visit  Medication Sig Dispense Refill   acetaminophen (TYLENOL) 500 MG tablet Take 1,000 mg by mouth every 6 (six) hours as needed for moderate pain.     carvedilol (COREG) 6.25 MG tablet TAKE 1 TABLET BY MOUTH 2 TIMES DAILY WITH A MEAL. 60 tablet 0   OVER THE COUNTER MEDICATION Apply 1 application topically daily as needed (pain). CBD cream     pantoprazole (PROTONIX) 40 MG tablet TAKE 1 TABLET BY MOUTH TWICE A DAY 180 tablet 1   Potassium Chloride ER (KLOR-CON M15) 15 MEQ TBCR TAKE 1 TABLET BY MOUTH THREE TIMES A DAY 270 each 0   rosuvastatin (CRESTOR) 10 MG tablet TAKE 1 TABLET BY MOUTH EVERY DAY 90 tablet 1   sucralfate (CARAFATE) 1 g tablet TAKE 1 TABLET BY MOUTH EVERY DAY 60 tablet 1   doxycycline (VIBRA-TABS) 100 MG tablet Take 1 tablet (100 mg total) by mouth 2 (two) times daily. 14 tablet 0   hydrochlorothiazide (HYDRODIURIL) 25 MG tablet Take 1 tablet (25 mg total) by mouth daily as needed. 90 tablet 3   ondansetron (ZOFRAN) 4 MG tablet Take 1 tablet (4 mg total) by mouth every 6 (six) hours as needed for nausea or vomiting. 30 tablet 5   bacitracin ointment Apply 1 Application topically 2 (two) times daily. 120 g 0   No facility-administered medications prior to visit.    ROS Review of Systems  Constitutional:  Positive for unexpected weight change (wt gain). Negative for diaphoresis and fatigue.  HENT: Negative.    Eyes: Negative.   Respiratory: Negative.  Negative for cough, chest tightness, shortness of breath and wheezing.   Cardiovascular:  Negative for chest pain,  palpitations and leg swelling.  Gastrointestinal: Negative.  Negative for abdominal pain, diarrhea, nausea and vomiting.  Endocrine: Negative.   Genitourinary: Negative.  Negative for difficulty urinating and dysuria.  Musculoskeletal:  Positive for arthralgias and gait problem.  Skin: Negative.   Neurological:  Negative for weakness, light-headedness and numbness.  Hematological:  Negative for adenopathy. Does not bruise/bleed easily.  Psychiatric/Behavioral: Negative.      Objective:  BP (!) 184/108 (BP Location: Right Arm, Patient Position: Sitting, Cuff Size: Large)   Pulse 79   Temp 97.8 F (36.6 C) (Oral)   Ht 6' (1.829 m)   Wt 243 lb (110.2 kg)   SpO2 94%   BMI 32.96 kg/m   BP Readings from Last 3 Encounters:  07/08/22 (!) 184/108  05/26/22 (!) 140/92  05/11/22 (!) 190/103    Wt Readings from Last 3 Encounters:  07/08/22 243 lb (110.2 kg)  04/06/22 232 lb (105.2 kg)  12/26/21 221 lb (100.2 kg)    Physical Exam Vitals reviewed.  HENT:     Mouth/Throat:     Mouth: Mucous membranes are moist.  Eyes:     General: No scleral icterus.    Conjunctiva/sclera: Conjunctivae normal.  Cardiovascular:     Rate and Rhythm: Normal rate. Occasional Extrasystoles are present.    Heart sounds: Murmur heard.     Systolic murmur is present with a  grade of 1/6.     No diastolic murmur is present.     No gallop.     Comments: EKG- SR with 1st degree AV block with occ PVC/PAC No LVH or Q waves Pulmonary:     Effort: Pulmonary effort is normal.     Breath sounds: No stridor. No wheezing, rhonchi or rales.  Abdominal:     General: Abdomen is flat.     Palpations: There is no mass.     Tenderness: There is no abdominal tenderness. There is no guarding.     Hernia: No hernia is present.  Musculoskeletal:     Cervical back: Neck supple.     Right lower leg: No edema.     Left lower leg: No edema.  Lymphadenopathy:     Cervical: No cervical adenopathy.  Skin:    General:  Skin is warm and dry.  Neurological:     General: No focal deficit present.     Mental Status: He is alert. Mental status is at baseline.  Psychiatric:        Mood and Affect: Mood normal.        Behavior: Behavior normal.     Lab Results  Component Value Date   WBC 6.4 07/08/2022   HGB 14.8 07/08/2022   HCT 43.8 07/08/2022   PLT 244.0 07/08/2022   GLUCOSE 110 (H) 07/08/2022   CHOL 139 12/23/2021   TRIG 153.0 (H) 12/23/2021   HDL 88.90 12/23/2021   LDLDIRECT 111.0 05/30/2019   LDLCALC 20 12/23/2021   ALT 24 07/08/2022   AST 22 07/08/2022   NA 140 07/08/2022   K 3.4 (L) 07/08/2022   CL 97 07/08/2022   CREATININE 0.87 07/08/2022   BUN 16 07/08/2022   CO2 34 (H) 07/08/2022   TSH 1.69 12/23/2021   PSA 2.16 12/23/2021   INR 1.02 07/29/2018   HGBA1C 5.1 10/09/2019    CT Head Wo Contrast  Result Date: 05/11/2022 CLINICAL DATA:  Trauma hit head laceration EXAM: CT HEAD WITHOUT CONTRAST CT CERVICAL SPINE WITHOUT CONTRAST TECHNIQUE: Multidetector CT imaging of the head and cervical spine was performed following the standard protocol without intravenous contrast. Multiplanar CT image reconstructions of the cervical spine were also generated. RADIATION DOSE REDUCTION: This exam was performed according to the departmental dose-optimization program which includes automated exposure control, adjustment of the mA and/or kV according to patient size and/or use of iterative reconstruction technique. COMPARISON:  None Available. FINDINGS: CT HEAD FINDINGS Brain: No acute territorial infarction, hemorrhage or intracranial mass. Small focus of encephalomalacia within the right frontal lobe, series 3, image 20. Mild atrophy and chronic small vessel ischemic changes of the white matter. The ventricles are nonenlarged Vascular: No hyperdense vessel or unexpected calcification. Skull: Normal. Negative for fracture or focal lesion. Sinuses/Orbits: Mild mucosal thickening in the sinuses Other: Right  forehead scalp laceration CT CERVICAL SPINE FINDINGS Alignment: No subluxation.  Facet alignment within normal limits. Skull base and vertebrae: No acute fracture. No primary bone lesion or focal pathologic process. Soft tissues and spinal canal: No prevertebral fluid or swelling. No visible canal hematoma. Disc levels:  Moderate degenerative changes C5 through C7. Upper chest: Negative. Other: None IMPRESSION: 1. No CT evidence for acute intracranial abnormality. Atrophy and chronic small vessel ischemic changes of the white matter 2. No acute osseous abnormality of the cervical spine Electronically Signed   By: Donavan Foil M.D.   On: 05/11/2022 21:15   CT Cervical Spine Wo Contrast  Result  Date: 05/11/2022 CLINICAL DATA:  Trauma hit head laceration EXAM: CT HEAD WITHOUT CONTRAST CT CERVICAL SPINE WITHOUT CONTRAST TECHNIQUE: Multidetector CT imaging of the head and cervical spine was performed following the standard protocol without intravenous contrast. Multiplanar CT image reconstructions of the cervical spine were also generated. RADIATION DOSE REDUCTION: This exam was performed according to the departmental dose-optimization program which includes automated exposure control, adjustment of the mA and/or kV according to patient size and/or use of iterative reconstruction technique. COMPARISON:  None Available. FINDINGS: CT HEAD FINDINGS Brain: No acute territorial infarction, hemorrhage or intracranial mass. Small focus of encephalomalacia within the right frontal lobe, series 3, image 20. Mild atrophy and chronic small vessel ischemic changes of the white matter. The ventricles are nonenlarged Vascular: No hyperdense vessel or unexpected calcification. Skull: Normal. Negative for fracture or focal lesion. Sinuses/Orbits: Mild mucosal thickening in the sinuses Other: Right forehead scalp laceration CT CERVICAL SPINE FINDINGS Alignment: No subluxation.  Facet alignment within normal limits. Skull base and  vertebrae: No acute fracture. No primary bone lesion or focal pathologic process. Soft tissues and spinal canal: No prevertebral fluid or swelling. No visible canal hematoma. Disc levels:  Moderate degenerative changes C5 through C7. Upper chest: Negative. Other: None IMPRESSION: 1. No CT evidence for acute intracranial abnormality. Atrophy and chronic small vessel ischemic changes of the white matter 2. No acute osseous abnormality of the cervical spine Electronically Signed   By: Donavan Foil M.D.   On: 05/11/2022 21:15    Assessment & Plan:   Shloima was seen today for hypertension.  Diagnoses and all orders for this visit:  Essential hypertension, benign- His blood pressure remains too high and he is hypokalemic.  Will evaluate for secondary causes.  Will screen for renal artery stenosis.  He has proteinuria so I have asked him to see nephrology.  Will add an ARB and will change plain HCTZ to triamterene plus HCTZ to try to correct the hypokalemia and achieve better BP control. -     CBC with Differential/Platelet; Future -     Magnesium; Future -     Basic metabolic panel; Future -     Aldosterone + renin activity w/ ratio; Future -     Urinalysis, Routine w reflex microscopic; Future -     Hepatic function panel; Future -     EKG 12-Lead -     VAS US RENAL ARTERY DUPLEX; Future -     Hepatic function panel -     Urinalysis, Routine w reflex microscopic -     Aldosterone + renin activity w/ ratio -     Basic metabolic panel -     Magnesium -     CBC with Differential/Platelet -     olmesartan (BENICAR) 20 MG tablet; Take 1 tablet (20 mg total) by mouth daily. -     triamterene-hydrochlorothiazide (DYAZIDE) 37.5-25 MG capsule; Take 1 each (1 capsule total) by mouth daily.  Diuretic-induced hypokalemia -     Magnesium; Future -     Basic metabolic panel; Future -     Basic metabolic panel -     Magnesium -     triamterene-hydrochlorothiazide (DYAZIDE) 37.5-25 MG capsule; Take 1  each (1 capsule total) by mouth daily.  Proteinuria, unspecified type -     Ambulatory referral to Nephrology   I have discontinued Dell C. Tomassetti's ondansetron, hydrochlorothiazide, bacitracin, and doxycycline. I am also having him start on olmesartan and triamterene-hydrochlorothiazide. Additionally, I am having him  maintain his acetaminophen, OVER THE COUNTER MEDICATION, pantoprazole, carvedilol, K[place]-Con M15, rosuvastatin, and sucralfate.  Meds ordered this encounter  Medications   olmesartan (BENICAR) 20 MG tablet    Sig: Take 1 tablet (20 mg total) by mouth daily.    Dispense:  90 tablet    Refill:  0   triamterene-hydrochlorothiazide (DYAZIDE) 37.5-25 MG capsule    Sig: Take 1 each (1 capsule total) by mouth daily.    Dispense:  90 capsule    Refill:  0     Follow-up: Return in about 6 weeks (around 08/19/2022).  Scarlette Calico, MD

## 2022-07-13 MED ORDER — TRIAMTERENE-HCTZ 37.5-25 MG PO CAPS: 1.0000 | ORAL_CAPSULE | Freq: Every day | ORAL | 0 refills | Status: DC

## 2022-07-14 LAB — ALDOSTERONE + RENIN ACTIVITY W/ RATIO
ALDO / PRA Ratio: 10.8 Ratio (ref 0.9–28.9)
Aldosterone: 4 ng/dL
Renin Activity: 0.37 ng/mL/h (ref 0.25–5.82)

## 2022-07-17 ENCOUNTER — Ambulatory Visit (HOSPITAL_COMMUNITY)
Admission: RE | Admit: 2022-07-17 | Discharge: 2022-07-17 | Disposition: A | Discharge status: Home / Self Care | Payer: 59 | Source: Ambulatory Visit | Attending: Internal Medicine | Admitting: Internal Medicine

## 2022-07-17 DIAGNOSIS — I10 Essential (primary) hypertension: Secondary | ICD-10-CM | POA: Diagnosis not present

## 2022-07-24 ENCOUNTER — Ambulatory Visit (HOSPITAL_COMMUNITY)
Admission: RE | Admit: 2022-07-24 | Discharge: 2022-07-24 | Disposition: A | Discharge status: Home / Self Care | Payer: 59 | Source: Ambulatory Visit | Attending: Cardiology | Admitting: Cardiology

## 2022-07-24 DIAGNOSIS — I7121 Aneurysm of the ascending aorta, without rupture: Secondary | ICD-10-CM | POA: Insufficient documentation

## 2022-07-24 MED ORDER — IOHEXOL 350 MG/ML SOLN
100.0000 mL | Freq: Once | INTRAVENOUS | Status: AC | PRN
  Administered 2022-07-24: 100 mL via INTRAVENOUS

## 2022-07-24 MED ORDER — SODIUM CHLORIDE (PF) 0.9 % IJ SOLN
INTRAMUSCULAR | Status: AC
  Filled 2022-07-24: qty 50

## 2022-08-17 ENCOUNTER — Encounter: Payer: Self-pay | Admitting: Cardiology

## 2022-08-17 ENCOUNTER — Ambulatory Visit: Payer: 59 | Attending: Cardiology | Admitting: Cardiology

## 2022-08-17 VITALS — BP 143/90 | HR 97 | Ht 72.0 in | Wt 246.2 lb

## 2022-08-17 DIAGNOSIS — I483 Typical atrial flutter: Secondary | ICD-10-CM

## 2022-08-17 DIAGNOSIS — I441 Atrioventricular block, second degree: Secondary | ICD-10-CM | POA: Diagnosis not present

## 2022-08-17 DIAGNOSIS — I7121 Aneurysm of the ascending aorta, without rupture: Secondary | ICD-10-CM | POA: Diagnosis not present

## 2022-08-17 DIAGNOSIS — Z01812 Encounter for preprocedural laboratory examination: Secondary | ICD-10-CM | POA: Diagnosis not present
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
## 2022-08-17 NOTE — Patient Instructions (Signed)
Medication Instructions:  The current medical regimen is effective;  continue present plan and medications.  *If you need a refill on your cardiac medications before your next appointment, please call your pharmacy*   Lab Work: Please have blood work in 1 year (BMP) before your CT scan.  If you have labs (blood work) drawn today and your tests are completely normal, you will receive your results only by: [place] (if you have MyChart) OR A paper copy in the mail If you have any lab test that is abnormal or we need to change your treatment, we will call you to review the results.   Testing/Procedures: Non-Cardiac CT Angiography (CTA), is a special type of CT scan that uses a computer to produce multi-dimensional views of major blood vessels throughout the body. In CT angiography, a contrast material is injected through an IV to help visualize the blood vessels  This is to be completed in 1 year.  You will be contacted at a later date to be scheduled.  Follow-Up: At [facility], you and your health needs are our priority.  As part of our continuing mission to provide you with exceptional heart care, we have created designated Provider Care Teams.  These Care Teams include your primary Cardiologist (physician) and Advanced Practice Providers (APPs -  Physician Assistants and Nurse Practitioners) who all work together to provide you with the care you need, when you need it.  We recommend signing up for the patient portal called "MyChart".  Sign up information is provided on this After Visit Summary.  MyChart is used to connect with patients for Virtual Visits (Telemedicine).  Patients are able to view lab/test results, encounter notes, upcoming appointments, etc.  Non-urgent messages can be sent to your provider as well.   To learn more about what you can do with MyChart, go to [email or web address].    Your next appointment:   2 year(s)  Provider:   Candee Furbish, MD

## 2022-08-20 ENCOUNTER — Encounter: Payer: Self-pay | Admitting: Internal Medicine

## 2022-08-31 ENCOUNTER — Encounter: Payer: Self-pay | Admitting: Internal Medicine

## 2022-08-31 ENCOUNTER — Ambulatory Visit (INDEPENDENT_AMBULATORY_CARE_PROVIDER_SITE_OTHER): Payer: Medicare Other | Admitting: Internal Medicine

## 2022-08-31 VITALS — BP 132/78 | HR 90 | Temp 98.1°F | Resp 16 | Ht 72.0 in | Wt 245.0 lb

## 2022-08-31 DIAGNOSIS — M21612 Bunion of left foot: Secondary | ICD-10-CM | POA: Insufficient documentation

## 2022-08-31 DIAGNOSIS — I10 Essential (primary) hypertension: Secondary | ICD-10-CM | POA: Diagnosis not present

## 2022-08-31 NOTE — Progress Notes (Unsigned)
Subjective:  Patient ID: Henry Figueroa, male    DOB: 14-Aug-1955  Age: 67 y.o. MRN: [id number]  CC: Hypertension   HPI Henry Figueroa presents for f/up -  He complains of bunions.  Worse on the left than the right.  He would like to consider having surgery on the left side.  Outpatient Medications Prior to Visit  Medication Sig Dispense Refill   acetaminophen (TYLENOL) 500 MG tablet Take 1,000 mg by mouth every 6 (six) hours as needed for moderate pain.     carvedilol (COREG) 6.25 MG tablet TAKE 1 TABLET BY MOUTH 2 TIMES DAILY WITH A MEAL. 60 tablet 0   olmesartan (BENICAR) 20 MG tablet Take 1 tablet (20 mg total) by mouth daily. 90 tablet 0   pantoprazole (PROTONIX) 40 MG tablet TAKE 1 TABLET BY MOUTH TWICE A DAY 180 tablet 1   Potassium Chloride ER (KLOR-CON M15) 15 MEQ TBCR TAKE 1 TABLET BY MOUTH THREE TIMES A DAY 270 each 0   rosuvastatin (CRESTOR) 10 MG tablet TAKE 1 TABLET BY MOUTH EVERY DAY 90 tablet 1   sucralfate (CARAFATE) 1 g tablet TAKE 1 TABLET BY MOUTH EVERY DAY 60 tablet 1   triamterene-hydrochlorothiazide (DYAZIDE) 37.5-25 MG capsule Take 1 each (1 capsule total) by mouth daily. 90 capsule 0   No facility-administered medications prior to visit.    ROS Review of Systems  Constitutional: Negative.  Negative for chills, diaphoresis and fatigue.  HENT: Negative.    Eyes: Negative.   Respiratory:  Negative for cough, chest tightness, shortness of breath and wheezing.   Cardiovascular:  Negative for chest pain, palpitations and leg swelling.  Gastrointestinal:  Negative for abdominal pain.  Endocrine: Negative.   Genitourinary: Negative.  Negative for difficulty urinating.  Musculoskeletal:  Positive for arthralgias. Negative for myalgias.  Skin: Negative.   Neurological: Negative.  Negative for dizziness, weakness and light-headedness.  Hematological: Negative.   Psychiatric/Behavioral: Negative.      Objective:  BP 132/78 (BP Location: Left Arm, Patient  Position: Sitting, Cuff Size: Normal)   Pulse 90   Temp 98.1 F (36.7 C) (Oral)   Resp 16   Ht 6' (1.829 m)   Wt 245 lb (111.1 kg)   SpO2 98%   BMI 33.23 kg/m   BP Readings from Last 3 Encounters:  08/31/22 132/78  08/17/22 (!) 143/90  07/08/22 (!) 184/108    Wt Readings from Last 3 Encounters:  08/31/22 245 lb (111.1 kg)  08/17/22 246 lb 3.2 oz (111.7 kg)  07/08/22 243 lb (110.2 kg)    Physical Exam Vitals reviewed.  HENT:     Nose: Nose normal.     Mouth/Throat:     Mouth: Mucous membranes are moist.  Eyes:     General: No scleral icterus.    Conjunctiva/sclera: Conjunctivae normal.  Cardiovascular:     Rate and Rhythm: Normal rate and regular rhythm.     Heart sounds: No murmur heard. Pulmonary:     Effort: Pulmonary effort is normal.     Breath sounds: No stridor. No wheezing, rhonchi or rales.  Abdominal:     General: Abdomen is protuberant. Bowel sounds are normal. There is no distension.     Palpations: Abdomen is soft. There is no hepatomegaly, splenomegaly or mass.     Tenderness: There is no abdominal tenderness. There is no guarding.  Musculoskeletal:        General: Deformity present. Normal range of motion.     Cervical back:  Neck supple.     Right lower leg: No edema.     Left lower leg: No edema.  Lymphadenopathy:     Cervical: No cervical adenopathy.  Skin:    General: Skin is warm and dry.  Neurological:     General: No focal deficit present.     Mental Status: He is alert.  Psychiatric:        Mood and Affect: Mood normal.        Behavior: Behavior normal.     Lab Results  Component Value Date   WBC 6.4 07/08/2022   HGB 14.8 07/08/2022   HCT 43.8 07/08/2022   PLT 244.0 07/08/2022   GLUCOSE 110 (H) 07/08/2022   CHOL 139 12/23/2021   TRIG 153.0 (H) 12/23/2021   HDL 88.90 12/23/2021   LDLDIRECT 111.0 05/30/2019   LDLCALC 20 12/23/2021   ALT 24 07/08/2022   AST 22 07/08/2022   NA 140 07/08/2022   K 3.4 (L) 07/08/2022   CL 97  07/08/2022   CREATININE 0.87 07/08/2022   BUN 16 07/08/2022   CO2 34 (H) 07/08/2022   TSH 1.69 12/23/2021   PSA 2.16 12/23/2021   INR 1.02 07/29/2018   HGBA1C 5.1 10/09/2019    CT ANGIO CHEST AORTA W/CM & OR WO/CM  Result Date: 07/24/2022 CLINICAL DATA:  Aortic aneurysm suspected EXAM: CT ANGIOGRAPHY CHEST WITH CONTRAST TECHNIQUE: Multidetector CT imaging of the chest was performed using the standard protocol during bolus administration of intravenous contrast. Multiplanar CT image reconstructions and MIPs were obtained to evaluate the vascular anatomy. RADIATION DOSE REDUCTION: This exam was performed according to the departmental dose-optimization program which includes automated exposure control, adjustment of the mA and/or kV according to patient size and/or use of iterative reconstruction technique. CONTRAST:  153m OMNIPAQUE IOHEXOL 350 MG/ML SOLN COMPARISON:  CT angio chest 07/22/2021, MRI abdomen 08/06/2021. FINDINGS: Cardiovascular: Preferential opacification of the thoracic aorta. Stable aneurysmal ascending thoracic measuring up to 4.8 cm. The descending thoracic aorta is normal in caliber. Limited evaluation of the aortic root due to motion artifact. No dissection. Normal heart size. No pericardial effusion. Mild atherosclerotic plaque. Four-vessel coronary calcification. The main pulmonary artery is normal in caliber. No central or proximal segmental pulmonary embolus. Limited evaluation more distally due to timing of contrast and motion artifact. Mediastinum/Nodes: No enlarged mediastinal, hilar, or axillary lymph nodes. Thyroid gland, trachea, and esophagus demonstrate no significant findings. Lungs/Pleura: Limited evaluation due to motion artifact. No focal consolidation. No pulmonary nodule. No pulmonary mass. No pleural effusion. No pneumothorax. Upper Abdomen: Partially visualized known 1.8 cm hypodensity along the pancreatic tail. Status post cholecystectomy. Splenule noted.  Musculoskeletal: No chest wall abnormality. No suspicious lytic or blastic osseous lesions. No acute displaced fracture. Old healed left rib fractures. Review of the MIP images confirms the above findings. IMPRESSION: 1. Stable aneurysmal ascending thoracic measuring up to 4.8 cm. Ascending thoracic aortic aneurysm. Recommend semi-annual imaging followup by CTA or MRA and referral to cardiothoracic surgery if not already obtained. This recommendation follows 2010 ACCF/AHA/AATS/ACR/ASA/SCA/SCAI/SIR/STS/SVM Guidelines for the Diagnosis and Management of Patients With Thoracic Aortic Disease. Circulation. 2010; 121:: G269-S854 Aortic aneurysm NOS (ICD10-I71.9). 2. No central or proximal segmental pulmonary embolus. Limited evaluation more distally due to timing of contrast and motion artifact. 3. No acute intrapulmonary abnormality. 4. Partially visualized known 1.8 cm hypodensity along the pancreatic tail. Finding better evaluated on MRI abdomen 08/06/2021. Electronically Signed   By: MIven FinnM.D.   On: 07/24/2022 19:51   CT  ANGIO CHEST AORTA W/CM & OR WO/CM  Result Date: 07/24/2022 CLINICAL DATA:  Aortic aneurysm suspected EXAM: CT ANGIOGRAPHY CHEST WITH CONTRAST TECHNIQUE: Multidetector CT imaging of the chest was performed using the standard protocol during bolus administration of intravenous contrast. Multiplanar CT image reconstructions and MIPs were obtained to evaluate the vascular anatomy. RADIATION DOSE REDUCTION: This exam was performed according to the departmental dose-optimization program which includes automated exposure control, adjustment of the mA and/or kV according to patient size and/or use of iterative reconstruction technique. CONTRAST:  15m OMNIPAQUE IOHEXOL 350 MG/ML SOLN COMPARISON:  CT angio chest 07/22/2021, MRI abdomen 08/06/2021. FINDINGS: Cardiovascular: Preferential opacification of the thoracic aorta. Stable aneurysmal ascending thoracic measuring up to 4.8 cm. The  descending thoracic aorta is normal in caliber. Limited evaluation of the aortic root due to motion artifact. No dissection. Normal heart size. No pericardial effusion. Mild atherosclerotic plaque. Four-vessel coronary calcification. The main pulmonary artery is normal in caliber. No central or proximal segmental pulmonary embolus. Limited evaluation more distally due to timing of contrast and motion artifact. Mediastinum/Nodes: No enlarged mediastinal, hilar, or axillary lymph nodes. Thyroid gland, trachea, and esophagus demonstrate no significant findings. Lungs/Pleura: Limited evaluation due to motion artifact. No focal consolidation. No pulmonary nodule. No pulmonary mass. No pleural effusion. No pneumothorax. Upper Abdomen: Partially visualized known 1.8 cm hypodensity along the pancreatic tail. Status post cholecystectomy. Splenule noted. Musculoskeletal: No chest wall abnormality. No suspicious lytic or blastic osseous lesions. No acute displaced fracture. Old healed left rib fractures. Review of the MIP images confirms the above findings. IMPRESSION: 1. Stable aneurysmal ascending thoracic measuring up to 4.8 cm. Ascending thoracic aortic aneurysm. Recommend semi-annual imaging followup by CTA or MRA and referral to cardiothoracic surgery if not already obtained. This recommendation follows 2010 ACCF/AHA/AATS/ACR/ASA/SCA/SCAI/SIR/STS/SVM Guidelines for the Diagnosis and Management of Patients With Thoracic Aortic Disease. Circulation. 2010; 121:: G295-M841 Aortic aneurysm NOS (ICD10-I71.9). 2. No central or proximal segmental pulmonary embolus. Limited evaluation more distally due to timing of contrast and motion artifact. 3. No acute intrapulmonary abnormality. 4. Partially visualized known 1.8 cm hypodensity along the pancreatic tail. Finding better evaluated on MRI abdomen 08/06/2021. Electronically Signed   By: MIven FinnM.D.   On: 07/24/2022 19:51   VAS [place]NAL ARTERY DUPLEX  Result Date:  07/17/2022 ABDOMINAL VISCERAL Patient Name:  Henry Figueroa Date of Exam:   07/17/2022 Medical Rec #: [id number]       Accession #:    [id number] of Birth: [birth year]       Patient Gender: M Patient Age:   89years [facility] Procedure:      VAS [place]NAL ARTERY DUPLEX Referring Phys: 3[place]-------------------------------------------------------------------------------- Indications: Hypertension High Risk Factors: Hyperlipidemia, past history of smoking. Limitations: Air/bowel gas and obesity. Comparison Study: No prior study Performing Technologist: MMaudry MayhewMHA, RDMS, RVT, RDCS  Examination Guidelines: A complete evaluation includes B-mode imaging, spectral Doppler, color Doppler, and power Doppler as needed of all accessible portions of each vessel. Bilateral testing is considered an integral part of a complete examination. Limited examinations for reoccurring indications may be performed as noted.  Duplex Findings: +----------+--------+--------+------+--------+ MesentericPSV cm/sEDV cm/sPlaqueComments +----------+--------+--------+------+--------+ Aorta Prox   55                          +----------+--------+--------+------+--------+    +------------------+--------+--------+-------+ Right Renal ArteryPSV cm/sEDV cm/sComment +------------------+--------+--------+-------+ Origin  46      7            +------------------+--------+--------+-------+ Proximal             59      21           +------------------+--------+--------+-------+ Mid                  57      20           +------------------+--------+--------+-------+ Distal               66      19           +------------------+--------+--------+-------+ +-----------------+--------+--------+------------------+ Left Renal ArteryPSV cm/sEDV cm/s     Comment       +-----------------+--------+--------+------------------+ Origin                            Unable to insonate +-----------------+--------+--------+------------------+ Proximal            32      9                       +-----------------+--------+--------+------------------+ Mid                 37      11                      +-----------------+--------+--------+------------------+ Distal              45      13                      +-----------------+--------+--------+------------------+ +------------+--------+--------+----+-----------+--------+--------+----+ Right KidneyPSV cm/sEDV cm/sRI  Left KidneyPSV cm/sEDV cm/sRI   +------------+--------+--------+----+-----------+--------+--------+----+ Upper Pole                      Upper Pole                      +------------+--------+--------+----+-----------+--------+--------+----+ Mid         42      13      0.69Mid                             +------------+--------+--------+----+-----------+--------+--------+----+ Lower Pole                      Lower Pole 40      10      0.75 +------------+--------+--------+----+-----------+--------+--------+----+ Hilar       46      12      0.74Hilar      38      12      0.68 +------------+--------+--------+----+-----------+--------+--------+----+ +------------------+---------+------------------+---------+ Right Kidney               Left Kidney                 +------------------+---------+------------------+---------+ RAR                        RAR                         +------------------+---------+------------------+---------+ RAR (manual)      1.2      RAR (manual)      0.82      +------------------+---------+------------------+---------+  Cortex            19/6 cm/sCortex            33/8 cm/s +------------------+---------+------------------+---------+ Cortex thickness           Corex thickness             +------------------+---------+------------------+---------+ Kidney length (cm)11.20    Kidney length (cm)12.20      +------------------+---------+------------------+---------+  Summary: Renal:  Right: Normal size right kidney. No evidence of right renal artery        stenosis. RRV flow present. Left:  Normal size of left kidney. No evidence of left renal artery        stenosis. LRV flow present.  *See table(s) above for measurements and observations.  Diagnosing physician: Orlie Pollen  Electronically signed by Orlie Pollen on 07/17/2022 at 4:44:39 PM.    Final      Assessment & Plan:   Zykee was seen today for hypertension.  Diagnoses and all orders for this visit:  Bunion of left foot -     Ambulatory referral to Podiatry  Essential hypertension, benign- His blood pressure is well-controlled.   I am having Richards C. Louie Casa maintain his acetaminophen, pantoprazole, carvedilol, Klor-Con M15, rosuvastatin, sucralfate, olmesartan, and triamterene-hydrochlorothiazide.  No orders of the defined types were placed in this encounter.    Follow-up: Return in about 6 months (around 03/01/2023).     Scarlette Calico, MD

## 2022-08-31 NOTE — Patient Instructions (Signed)
Hypertension, Adult High blood pressure (hypertension) is when the force of blood pumping through the arteries is too strong. The arteries are the blood vessels that carry blood from the heart throughout the body. Hypertension forces the heart to work harder to pump blood and may cause arteries to become narrow or stiff. Untreated or uncontrolled hypertension can lead to a heart attack, heart failure, a stroke, kidney disease, and other problems. A blood pressure reading consists of a higher number over a lower number. Ideally, your blood pressure should be below 120/80. The first ("top") number is called the systolic pressure. It is a measure of the pressure in your arteries as your heart beats. The second ("bottom") number is called the diastolic pressure. It is a measure of the pressure in your arteries as the heart relaxes. What are the causes? The exact cause of this condition is not known. There are some conditions that result in high blood pressure. What increases the risk? Certain factors may make you more likely to develop high blood pressure. Some of these risk factors are under your control, including: Smoking. Not getting enough exercise or physical activity. Being overweight. Having too much fat, sugar, calories, or salt (sodium) in your diet. Drinking too much alcohol. Other risk factors include: Having a personal history of heart disease, diabetes, high cholesterol, or kidney disease. Stress. Having a family history of high blood pressure and high cholesterol. Having obstructive sleep apnea. Age. The risk increases with age. What are the signs or symptoms? High blood pressure may not cause symptoms. Very high blood pressure (hypertensive crisis) may cause: Headache. Fast or irregular heartbeats (palpitations). Shortness of breath. Nosebleed. Nausea and vomiting. Vision changes. Severe chest pain, dizziness, and seizures. How is this diagnosed? This condition is diagnosed by  measuring your blood pressure while you are seated, with your arm resting on a flat surface, your legs uncrossed, and your feet flat on the floor. The cuff of the blood pressure monitor will be placed directly against the skin of your upper arm at the level of your heart. Blood pressure should be measured at least twice using the same arm. Certain conditions can cause a difference in blood pressure between your right and left arms. If you have a high blood pressure reading during one visit or you have normal blood pressure with other risk factors, you may be asked to: Return on a different day to have your blood pressure checked again. Monitor your blood pressure at home for 1 week or longer. If you are diagnosed with hypertension, you may have other blood or imaging tests to help your health care provider understand your overall risk for other conditions. How is this treated? This condition is treated by making healthy lifestyle changes, such as eating healthy foods, exercising more, and reducing your alcohol intake. You may be referred for counseling on a healthy diet and physical activity. Your health care provider may prescribe medicine if lifestyle changes are not enough to get your blood pressure under control and if: Your systolic blood pressure is above 130. Your diastolic blood pressure is above 80. Your personal target blood pressure may vary depending on your medical conditions, your age, and other factors. Follow these instructions at home: Eating and drinking  Eat a diet that is high in fiber and potassium, and low in sodium, added sugar, and fat. An example of this eating plan is called the DASH diet. DASH stands for Dietary Approaches to Stop Hypertension. To eat this way: Eat   plenty of fresh fruits and vegetables. Try to fill one half of your plate at each meal with fruits and vegetables. Eat whole grains, such as whole-wheat pasta, brown rice, or whole-grain bread. Fill about one  fourth of your plate with whole grains. Eat or drink low-fat dairy products, such as skim milk or low-fat yogurt. Avoid fatty cuts of meat, processed or cured meats, and poultry with skin. Fill about one fourth of your plate with lean proteins, such as fish, chicken without skin, beans, eggs, or tofu. Avoid pre-made and processed foods. These tend to be higher in sodium, added sugar, and fat. Reduce your daily sodium intake. Many people with hypertension should eat less than 1,500 mg of sodium a day. Do not drink alcohol if: Your health care provider tells you not to drink. You are pregnant, may be pregnant, or are planning to become pregnant. If you drink alcohol: Limit how much you have to: 0-1 drink a day for women. 0-2 drinks a day for men. Know how much alcohol is in your drink. In the [place] one drink equals one 12 oz bottle of beer (355 mL), one 5 oz glass of wine (148 mL), or one 1 oz glass of hard liquor (44 mL). Lifestyle  Work with your health care provider to maintain a healthy body weight or to lose weight. Ask what an ideal weight is for you. Get at least 30 minutes of exercise that causes your heart to beat faster (aerobic exercise) most days of the week. Activities may include walking, swimming, or biking. Include exercise to strengthen your muscles (resistance exercise), such as Pilates or lifting weights, as part of your weekly exercise routine. Try to do these types of exercises for 30 minutes at least 3 days a week. Do not use any products that contain nicotine or tobacco. These products include cigarettes, chewing tobacco, and vaping devices, such as e-cigarettes. If you need help quitting, ask your health care provider. Monitor your blood pressure at home as told by your health care provider. Keep all follow-up visits. This is important. Medicines Take over-the-counter and prescription medicines only as told by your health care provider. Follow directions carefully. Blood  pressure medicines must be taken as prescribed. Do not skip doses of blood pressure medicine. Doing this puts you at risk for problems and can make the medicine less effective. Ask your health care provider about side effects or reactions to medicines that you should watch for. Contact a health care provider if you: Think you are having a reaction to a medicine you are taking. Have headaches that keep coming back (recurring). Feel dizzy. Have swelling in your ankles. Have trouble with your vision. Get help right away if you: Develop a severe headache or confusion. Have unusual weakness or numbness. Feel faint. Have severe pain in your chest or abdomen. Vomit repeatedly. Have trouble breathing. These symptoms may be an emergency. Get help right away. Call 911. Do not wait to see if the symptoms will go away. Do not drive yourself to the hospital. Summary Hypertension is when the force of blood pumping through your arteries is too strong. If this condition is not controlled, it may put you at risk for serious complications. Your personal target blood pressure may vary depending on your medical conditions, your age, and other factors. For most people, a normal blood pressure is less than 120/80. Hypertension is treated with lifestyle changes, medicines, or a combination of both. Lifestyle changes include losing weight, eating a healthy,   low-sodium diet, exercising more, and limiting alcohol. This information is not intended to replace advice given to you by your health care provider. Make sure you discuss any questions you have with your health care provider. Document Revised: 05/20/2021 Document Reviewed: 05/20/2021 [employer] Patient Education  2023 [employer]  

## 2022-09-07 LAB — HIV ANTIBODY (ROUTINE TESTING W REFLEX): HIV 1&2 Ab, 4th Generation: NONREACTIVE

## 2022-09-07 LAB — CBC: RBC: 4.73 (ref 3.87–5.11)

## 2022-09-07 LAB — BASIC METABOLIC PANEL
BUN: 22 — AB (ref 4–21)
CO2: 24 — AB (ref 13–22)
Chloride: 100 (ref 99–108)
Creatinine: 1 (ref 0.6–1.3)
Glucose: 125
Potassium: 4.3 mEq/L (ref 3.5–5.1)
Sodium: 138 (ref 137–147)

## 2022-09-07 LAB — CBC AND DIFFERENTIAL
HCT: 46 (ref 41–53)
Hemoglobin: 15.6 (ref 13.5–17.5)
Neutrophils Absolute: 3.8
Platelets: 227 10*3/uL (ref 150–400)
WBC: 6.5

## 2022-09-07 LAB — COMPREHENSIVE METABOLIC PANEL
Albumin: 4.8 (ref 3.5–5.0)
Calcium: 9.9 (ref 8.7–10.7)
eGFR: 81

## 2022-09-07 LAB — HM HEPATITIS C SCREENING LAB: HM Hepatitis Screen: NEGATIVE

## 2022-09-07 LAB — HM HIV SCREENING LAB: HM HIV Screening: NEGATIVE

## 2022-09-07 LAB — PROTEIN / CREATININE RATIO, URINE: Creatinine, Urine: 128.2

## 2022-09-07 LAB — HEMOGLOBIN A1C: Hemoglobin A1C: 5.4

## 2022-09-08 ENCOUNTER — Ambulatory Visit (INDEPENDENT_AMBULATORY_CARE_PROVIDER_SITE_OTHER): Payer: Medicare Other

## 2022-09-08 ENCOUNTER — Encounter: Payer: Self-pay | Admitting: Podiatry

## 2022-09-08 ENCOUNTER — Ambulatory Visit (INDEPENDENT_AMBULATORY_CARE_PROVIDER_SITE_OTHER): Payer: Medicare Other | Admitting: Podiatry

## 2022-09-08 DIAGNOSIS — M2012 Hallux valgus (acquired), left foot: Secondary | ICD-10-CM | POA: Diagnosis not present

## 2022-09-08 DIAGNOSIS — M778 Other enthesopathies, not elsewhere classified: Secondary | ICD-10-CM

## 2022-09-08 MED ORDER — DEXAMETHASONE SODIUM PHOSPHATE 120 MG/30ML IJ SOLN
2.0000 mg | Freq: Once | INTRAMUSCULAR | Status: AC
  Administered 2022-09-08: 2 mg via INTRA_ARTICULAR
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
## 2022-09-12 ENCOUNTER — Other Ambulatory Visit: Payer: Self-pay | Admitting: Internal Medicine

## 2022-09-12 DIAGNOSIS — E876 Hypokalemia: Secondary | ICD-10-CM

## 2022-09-12 DIAGNOSIS — I10 Essential (primary) hypertension: Secondary | ICD-10-CM

## 2022-10-04 ENCOUNTER — Other Ambulatory Visit: Payer: Self-pay | Admitting: Internal Medicine

## 2022-10-04 DIAGNOSIS — I10 Essential (primary) hypertension: Secondary | ICD-10-CM

## 2022-10-05 ENCOUNTER — Other Ambulatory Visit: Payer: Self-pay | Admitting: Internal Medicine

## 2022-10-05 DIAGNOSIS — I10 Essential (primary) hypertension: Secondary | ICD-10-CM

## 2022-10-05 DIAGNOSIS — E876 Hypokalemia: Secondary | ICD-10-CM

## 2022-10-16 ENCOUNTER — Other Ambulatory Visit: Payer: Self-pay | Admitting: Internal Medicine

## 2022-10-16 DIAGNOSIS — I10 Essential (primary) hypertension: Secondary | ICD-10-CM

## 2022-10-16 MED ORDER — OLMESARTAN MEDOXOMIL 20 MG PO TABS: 20.0000 mg | ORAL_TABLET | Freq: Every day | ORAL | 1 refills | Status: DC

## 2022-10-23 ENCOUNTER — Other Ambulatory Visit: Payer: Self-pay | Admitting: Gastroenterology

## 2022-11-01 ENCOUNTER — Other Ambulatory Visit: Payer: Self-pay | Admitting: Internal Medicine

## 2022-11-01 DIAGNOSIS — I10 Essential (primary) hypertension: Secondary | ICD-10-CM

## 2022-11-02 MED ORDER — CARVEDILOL 6.25 MG PO TABS: 6.2500 mg | ORAL_TABLET | Freq: Two times a day (BID) | ORAL | 5 refills | Status: DC

## 2022-11-03 ENCOUNTER — Telehealth: Payer: Self-pay

## 2022-11-03 DIAGNOSIS — K862 Cyst of pancreas: Secondary | ICD-10-CM

## 2022-11-03 NOTE — Telephone Encounter (Signed)
MRCP order in epic. Secure staff message sent to radiology scheduling to contact patient to set up appointment.   MyChart message sent to patient with imaging reminder.

## 2022-11-03 NOTE — Telephone Encounter (Signed)
-----   Message from Chrystie Nose, RN sent at 11/03/2022 12:02 PM EDT -----  ----- Message ----- From: Loretha Stapler, RN Sent: 10/16/2022  12:00 AM EDT To: Orion Modest, RN  MRI/MRCP to be done at a 1 year interval.  This can be placed under Dr. Frankey Shown name.  Patient should follow-up with Dr. Barron Alvine after the MRI has been completed.

## 2022-11-04 NOTE — Telephone Encounter (Signed)
MRCP scheduled for Thursday, 11/05/22 at 3 pm

## 2022-11-05 ENCOUNTER — Ambulatory Visit (HOSPITAL_COMMUNITY)
Admission: RE | Admit: 2022-11-05 | Discharge: 2022-11-05 | Disposition: A | Discharge status: Home / Self Care | Payer: Medicare Other | Source: Ambulatory Visit | Attending: Gastroenterology | Admitting: Gastroenterology

## 2022-11-05 ENCOUNTER — Other Ambulatory Visit: Payer: Self-pay | Admitting: Gastroenterology

## 2022-11-05 DIAGNOSIS — K862 Cyst of pancreas: Secondary | ICD-10-CM | POA: Diagnosis present

## 2022-11-05 MED ORDER — GADOBUTROL 1 MMOL/ML IV SOLN: 10.0000 mL | Freq: Once | INTRAVENOUS | Status: DC | PRN

## 2022-11-05 MED ORDER — GADOBUTROL 1 MMOL/ML IV SOLN
9.5000 mL | Freq: Once | INTRAVENOUS | Status: AC | PRN
  Administered 2022-11-05: 9.5 mL via INTRAVENOUS

## 2022-12-15 ENCOUNTER — Other Ambulatory Visit: Payer: Self-pay | Admitting: Internal Medicine

## 2022-12-15 DIAGNOSIS — T502X5A Adverse effect of carbonic-anhydrase inhibitors, benzothiadiazides and other diuretics, initial encounter: Secondary | ICD-10-CM

## 2022-12-15 DIAGNOSIS — I10 Essential (primary) hypertension: Secondary | ICD-10-CM

## 2022-12-16 ENCOUNTER — Other Ambulatory Visit: Payer: Self-pay | Admitting: Internal Medicine

## 2022-12-16 DIAGNOSIS — E785 Hyperlipidemia, unspecified: Secondary | ICD-10-CM

## 2022-12-21 ENCOUNTER — Encounter: Payer: Self-pay | Admitting: Internal Medicine

## 2022-12-22 ENCOUNTER — Other Ambulatory Visit: Payer: Self-pay | Admitting: Internal Medicine

## 2022-12-23 ENCOUNTER — Other Ambulatory Visit: Payer: Self-pay | Admitting: Internal Medicine

## 2022-12-23 DIAGNOSIS — R112 Nausea with vomiting, unspecified: Secondary | ICD-10-CM

## 2022-12-23 MED ORDER — ONDANSETRON 4 MG PO TBDP
4.0000 mg | ORAL_TABLET | Freq: Three times a day (TID) | ORAL | 1 refills | Status: DC | PRN
Start: 2022-12-23 — End: 2023-09-17

## 2022-12-28 DIAGNOSIS — M1711 Unilateral primary osteoarthritis, right knee: Secondary | ICD-10-CM | POA: Diagnosis not present

## 2022-12-31 ENCOUNTER — Other Ambulatory Visit: Payer: Self-pay | Admitting: Internal Medicine

## 2022-12-31 DIAGNOSIS — I10 Essential (primary) hypertension: Secondary | ICD-10-CM

## 2022-12-31 DIAGNOSIS — E876 Hypokalemia: Secondary | ICD-10-CM

## 2023-01-07 DIAGNOSIS — M1711 Unilateral primary osteoarthritis, right knee: Secondary | ICD-10-CM | POA: Diagnosis not present

## 2023-01-14 DIAGNOSIS — M1711 Unilateral primary osteoarthritis, right knee: Secondary | ICD-10-CM | POA: Diagnosis not present

## 2023-01-21 DIAGNOSIS — M1711 Unilateral primary osteoarthritis, right knee: Secondary | ICD-10-CM | POA: Diagnosis not present

## 2023-03-30 ENCOUNTER — Other Ambulatory Visit: Payer: Self-pay | Admitting: Internal Medicine

## 2023-03-30 DIAGNOSIS — I10 Essential (primary) hypertension: Secondary | ICD-10-CM

## 2023-04-01 ENCOUNTER — Other Ambulatory Visit: Payer: Self-pay | Admitting: Internal Medicine

## 2023-04-01 DIAGNOSIS — I10 Essential (primary) hypertension: Secondary | ICD-10-CM

## 2023-04-01 DIAGNOSIS — T502X5A Adverse effect of carbonic-anhydrase inhibitors, benzothiadiazides and other diuretics, initial encounter: Secondary | ICD-10-CM

## 2023-04-05 ENCOUNTER — Encounter: Payer: Self-pay | Admitting: Internal Medicine

## 2023-04-06 ENCOUNTER — Telehealth: Payer: Self-pay | Admitting: Internal Medicine

## 2023-04-06 NOTE — Telephone Encounter (Signed)
Prescription Request  04/06/2023  LOV: 08/31/2022  What is the name of the medication or equipment?  olmesartan (BENICAR) 20 MG tablet  rosuvastatin (CRESTOR) 10 MG tablet  sucralfate (CARAFATE) 1 g tablet  ondansetron (ZOFRAN-ODT) 4 MG disintegrating tablet  carvedilol (COREG) 6.25 MG tablet  triamterene-hydrochlorothiazide (DYAZIDE) 37.5-25 MG capsule  Have you contacted your pharmacy to request a refill? No   [facility] [address]  [phone]  Which pharmacy would you like this sent to?  Patient notified that their request is being sent to the clinical staff for review and that they should receive a response within 2 business days.   Please advise at Mobile [phone] (mobile)

## 2023-04-06 NOTE — Telephone Encounter (Signed)
He is due for an appt Can he come in Friday AM?

## 2023-04-06 NOTE — Telephone Encounter (Signed)
Scheduled 9/13@920am 

## 2023-04-09 ENCOUNTER — Encounter: Payer: Self-pay | Admitting: Internal Medicine

## 2023-04-09 ENCOUNTER — Ambulatory Visit (INDEPENDENT_AMBULATORY_CARE_PROVIDER_SITE_OTHER): Payer: Medicare HMO | Admitting: Internal Medicine

## 2023-04-09 ENCOUNTER — Other Ambulatory Visit: Payer: Self-pay

## 2023-04-09 VITALS — BP 144/94 | HR 93 | Temp 97.9°F | Ht 72.0 in | Wt 233.4 lb

## 2023-04-09 DIAGNOSIS — R739 Hyperglycemia, unspecified: Secondary | ICD-10-CM | POA: Diagnosis not present

## 2023-04-09 DIAGNOSIS — N062 Isolated proteinuria with diffuse membranous glomerulonephritis, unspecified: Secondary | ICD-10-CM

## 2023-04-09 DIAGNOSIS — E876 Hypokalemia: Secondary | ICD-10-CM

## 2023-04-09 DIAGNOSIS — N400 Enlarged prostate without lower urinary tract symptoms: Secondary | ICD-10-CM | POA: Diagnosis not present

## 2023-04-09 DIAGNOSIS — I10 Essential (primary) hypertension: Secondary | ICD-10-CM

## 2023-04-09 DIAGNOSIS — Z0000 Encounter for general adult medical examination without abnormal findings: Secondary | ICD-10-CM | POA: Diagnosis not present

## 2023-04-09 DIAGNOSIS — E785 Hyperlipidemia, unspecified: Secondary | ICD-10-CM

## 2023-04-09 DIAGNOSIS — E781 Pure hyperglyceridemia: Secondary | ICD-10-CM

## 2023-04-09 DIAGNOSIS — Z23 Encounter for immunization: Secondary | ICD-10-CM

## 2023-04-09 DIAGNOSIS — E559 Vitamin D deficiency, unspecified: Secondary | ICD-10-CM

## 2023-04-09 DIAGNOSIS — T502X5A Adverse effect of carbonic-anhydrase inhibitors, benzothiadiazides and other diuretics, initial encounter: Secondary | ICD-10-CM

## 2023-04-09 DIAGNOSIS — Z0001 Encounter for general adult medical examination with abnormal findings: Secondary | ICD-10-CM

## 2023-04-09 LAB — HEPATIC FUNCTION PANEL
ALT: 40 U/L (ref 0–53)
AST: 38 U/L — ABNORMAL HIGH (ref 0–37)
Albumin: 4.3 g/dL (ref 3.5–5.2)
Alkaline Phosphatase: 50 U/L (ref 39–117)
Bilirubin, Direct: 0.1 mg/dL (ref 0.0–0.3)
Total Bilirubin: 0.6 mg/dL (ref 0.2–1.2)
Total Protein: 7 g/dL (ref 6.0–8.3)

## 2023-04-09 LAB — BASIC METABOLIC PANEL
BUN: 14 mg/dL (ref 6–23)
CO2: 34 meq/L — ABNORMAL HIGH (ref 19–32)
Calcium: 9.7 mg/dL (ref 8.4–10.5)
Chloride: 92 meq/L — ABNORMAL LOW (ref 96–112)
Creatinine, Ser: 1.06 mg/dL (ref 0.40–1.50)
GFR: 72.12 mL/min (ref >60.00)
Glucose, Bld: 79 mg/dL (ref 70–99)
Potassium: 3.2 meq/L — ABNORMAL LOW (ref 3.5–5.1)
Sodium: 140 meq/L (ref 135–145)

## 2023-04-09 LAB — CBC WITH DIFFERENTIAL/PLATELET
Basophils Absolute: 0.1 10*3/uL (ref 0.0–0.1)
Basophils Relative: 1 % (ref 0.0–3.0)
Eosinophils Absolute: 0.1 10*3/uL (ref 0.0–0.7)
Eosinophils Relative: 1.7 % (ref 0.0–5.0)
HCT: 50 % (ref 39.0–52.0)
Hemoglobin: 16.7 g/dL (ref 13.0–17.0)
Lymphocytes Relative: 43.4 % (ref 12.0–46.0)
Lymphs Abs: 2.4 10*3/uL (ref 0.7–4.0)
MCHC: 33.3 g/dL (ref 30.0–36.0)
MCV: 101.8 fl — ABNORMAL HIGH (ref 78.0–100.0)
Monocytes Absolute: 0.6 10*3/uL (ref 0.1–1.0)
Monocytes Relative: 11.1 % (ref 3.0–12.0)
Neutro Abs: 2.4 10*3/uL (ref 1.4–7.7)
Neutrophils Relative %: 42.8 % — ABNORMAL LOW (ref 43.0–77.0)
Platelets: 221 10*3/uL (ref 150.0–400.0)
RBC: 4.91 Mil/uL (ref 4.22–5.81)
RDW: 12.9 % (ref 11.5–15.5)
WBC: 5.6 10*3/uL (ref 4.0–10.5)

## 2023-04-09 LAB — MICROALBUMIN / CREATININE URINE RATIO
Creatinine,U: 198.8 mg/dL
Microalb Creat Ratio: 4.7 mg/g (ref 0.0–30.0)
Microalb, Ur: 9.4 mg/dL — ABNORMAL HIGH (ref 0.0–1.9)

## 2023-04-09 LAB — LIPID PANEL
Cholesterol: 146 mg/dL (ref 0–200)
HDL: 93.4 mg/dL (ref >39.00)
LDL Cholesterol: 32 mg/dL (ref 0–99)
NonHDL: 52.31
Total CHOL/HDL Ratio: 2
Triglycerides: 103 mg/dL (ref 0.0–149.0)
VLDL: 20.6 mg/dL (ref 0.0–40.0)

## 2023-04-09 LAB — URINALYSIS, ROUTINE W REFLEX MICROSCOPIC
Hgb urine dipstick: NEGATIVE
Ketones, ur: NEGATIVE
Leukocytes,Ua: NEGATIVE
Nitrite: NEGATIVE
Specific Gravity, Urine: 1.02 (ref 1.000–1.030)
Total Protein, Urine: 30 — AB
Urine Glucose: NEGATIVE
Urobilinogen, UA: 0.2 (ref 0.0–1.0)
pH: 5.5 (ref 5.0–8.0)

## 2023-04-09 LAB — TSH: TSH: 0.94 u[IU]/mL (ref 0.35–5.50)

## 2023-04-09 LAB — HEMOGLOBIN A1C: Hgb A1c MFr Bld: 4.8 % (ref 4.6–6.5)

## 2023-04-09 LAB — CK: Total CK: 54 U/L (ref 7–232)

## 2023-04-09 LAB — VITAMIN D 25 HYDROXY (VIT D DEFICIENCY, FRACTURES): VITD: 11.15 ng/mL — ABNORMAL LOW (ref 30.00–100.00)

## 2023-04-09 LAB — PSA: PSA: 1.63 ng/mL (ref 0.10–4.00)

## 2023-04-09 NOTE — Progress Notes (Unsigned)
Subjective:  Patient ID: Henry Figueroa, male    DOB: August 27, 1955  Age: 67 y.o. MRN: [id number]  CC: Hypertension, Annual Exam, and Hyperlipidemia   HPI Henry Figueroa presents for a CPX and f/up --   Outpatient Medications Prior to Visit  Medication Sig Dispense Refill   acetaminophen (TYLENOL) 500 MG tablet Take 1,000 mg by mouth every 6 (six) hours as needed for moderate pain.     carvedilol (COREG) 6.25 MG tablet Take 1 tablet (6.25 mg total) by mouth 2 (two) times daily with a meal. 60 tablet 5   KLOR-CON M15 15 MEQ TBCR TAKE 1 TABLET BY MOUTH THREE TIMES A DAY 270 tablet 0   olmesartan (BENICAR) 20 MG tablet Take 1 tablet (20 mg total) by mouth daily. 90 tablet 1   ondansetron (ZOFRAN-ODT) 4 MG disintegrating tablet Take 1 tablet (4 mg total) by mouth every 8 (eight) hours as needed for nausea or vomiting. 35 tablet 1   pantoprazole (PROTONIX) 40 MG tablet TAKE 1 TABLET BY MOUTH TWICE A DAY 180 tablet 1   rosuvastatin (CRESTOR) 10 MG tablet TAKE 1 TABLET BY MOUTH EVERY DAY 90 tablet 1   sucralfate (CARAFATE) 1 g tablet TAKE 1 TABLET BY MOUTH EVERY DAY 60 tablet 1   triamterene-hydrochlorothiazide (DYAZIDE) 37.5-25 MG capsule TAKE 1 CAPSULE BY MOUTH EVERY DAY 90 capsule 0   No facility-administered medications prior to visit.    ROS Review of Systems  Musculoskeletal:  Positive for arthralgias and gait problem (ataxia). Negative for back pain.  Neurological:  Positive for numbness.       Pain/N/T from neuropathy    Objective:  BP (!) 144/94 (BP Location: Left Arm, Patient Position: Sitting, Cuff Size: Normal)   Pulse 93   Temp 97.9 F (36.6 C) (Oral)   Ht 6' (1.829 m)   Wt 233 lb 6.4 oz (105.9 kg)   SpO2 99%   BMI 31.65 kg/m   BP Readings from Last 3 Encounters:  04/09/23 (!) 144/94  08/31/22 132/78  08/17/22 (!) 143/90    Wt Readings from Last 3 Encounters:  04/09/23 233 lb 6.4 oz (105.9 kg)  08/31/22 245 lb (111.1 kg)  08/17/22 246 lb 3.2 oz (111.7 kg)     Physical Exam Vitals reviewed.  Constitutional:      Appearance: Normal appearance.  Cardiovascular:     Rate and Rhythm: Normal rate and regular rhythm.     Heart sounds: No murmur heard.    No gallop.     Comments: EKG- SR with SA with 1 st degree AV block, 86 bpm Low voltage in III and aVF is not new No LVH Unchanged Pulmonary:     Effort: Pulmonary effort is normal.     Breath sounds: No stridor. No wheezing, rhonchi or rales.  Abdominal:     General: Abdomen is protuberant. Bowel sounds are normal. There is no distension.     Palpations: Abdomen is soft. There is no fluid wave, hepatomegaly, splenomegaly or mass.     Tenderness: There is no abdominal tenderness.     Hernia: There is no hernia in the left inguinal area or right inguinal area.  Genitourinary:    Pubic Area: No rash.      Penis: Normal and circumcised.      Testes: Normal.     Epididymis:     Right: Normal.     Left: Normal.     Prostate: Enlarged. Not tender and no nodules present.  Rectum: Normal. Guaiac result negative. No mass, tenderness, anal fissure, external hemorrhoid or internal hemorrhoid. Normal anal tone.  Musculoskeletal:     Right lower leg: No edema.     Left lower leg: No edema.  Lymphadenopathy:     Lower Body: No right inguinal adenopathy. No left inguinal adenopathy.     Lab Results  Component Value Date   WBC 6.5 09/07/2022   HGB 15.6 09/07/2022   HCT 46 09/07/2022   PLT 227 09/07/2022   GLUCOSE 110 (H) 07/08/2022   CHOL 139 12/23/2021   TRIG 153.0 (H) 12/23/2021   HDL 88.90 12/23/2021   LDLDIRECT 111.0 05/30/2019   LDLCALC 20 12/23/2021   ALT 24 07/08/2022   AST 22 07/08/2022   NA 138 09/07/2022   K 4.3 09/07/2022   CL 100 09/07/2022   CREATININE 1.0 09/07/2022   BUN 22 (A) 09/07/2022   CO2 24 (A) 09/07/2022   TSH 1.69 12/23/2021   PSA 2.16 12/23/2021   INR 1.02 07/29/2018   HGBA1C 5.4 09/07/2022    MR ABDOMEN MRCP W WO CONTAST  Result Date:  11/06/2022 CLINICAL DATA:  67 year old male with history of pancreatic lesion. Follow-up study. EXAM: MRI ABDOMEN WITHOUT AND WITH CONTRAST (INCLUDING MRCP) TECHNIQUE: Multiplanar multisequence MR imaging of the abdomen was performed both before and after the administration of intravenous contrast. Heavily T2-weighted images of the biliary and pancreatic ducts were obtained, and three-dimensional MRCP images were rendered by post processing. CONTRAST:  9.59mL GADAVIST GADOBUTROL 1 MMOL/ML IV SOLN COMPARISON:  Abdominal MRI 08/06/2021. FINDINGS: Lower chest: Unremarkable. Hepatobiliary: Diffuse loss of signal intensity throughout the hepatic parenchyma on out of phase dual echo images, indicative of a background of hepatic steatosis. No suspicious cystic or solid hepatic lesions are noted. Status post cholecystectomy. No intra or extrahepatic biliary ductal dilatation noted on MRCP images. Common bile duct measures 4 mm in the porta hepatis. No filling defect in the common bile duct to suggest choledocholithiasis. Pancreas: Well-circumscribed lesion in the tail of the pancreas (axial image 23 of series 24 and coronal image 22 of series 4) measures 2.3 x 1.9 x 1.9 cm and is T1 hypointense, T2 hyperintense, without definite internal soft tissue nodularity or substantial internal enhancement. The wall of the lesion demonstrates some low-level enhancement on post gadolinium images, similar to the prior study. This lesion does not appear to communicate with the main pancreatic duct. No other suspicious pancreatic lesions are noted. No pancreatic ductal dilatation. No peripancreatic fluid collections or inflammatory changes. Spleen:  Unremarkable. Adrenals/Urinary Tract: Subcentimeter T1 hypointense, T2 hyperintense, nonenhancing lesions in the left kidney are compatible with tiny simple cysts (Bosniak class 1, no imaging follow-up recommended). Right kidney and bilateral adrenal glands are otherwise normal in appearance.  No hydroureteronephrosis in the visualized portions of the abdomen. Stomach/Bowel: Visualized portions are unremarkable. Vascular/Lymphatic: No aneurysm identified in the visualized abdominal vasculature. No lymphadenopathy noted in the abdomen. Other: No significant volume of ascites noted in the visualized portions of the peritoneal cavity. Musculoskeletal: No aggressive appearing osseous lesions are noted in the visualized portions of the skeleton. IMPRESSION: 1. Small cystic lesion in the tail of the pancreas, stable compared to the prior examination, favored to represent a benign lesion such as a small side branch IPMN (intraductal papillary mucinous neoplasm), or small pseudocyst. Repeat abdominal MRI with and without IV gadolinium with MRCP is recommended in 6 months to ensure continued stability. This recommendation follows ACR consensus guidelines: Management of Incidental Pancreatic Cysts: A White  Paper of the ACR Incidental Findings Committee. J Am Coll Radiol 2017;14:911-923. 2. Hepatic steatosis. Electronically Signed   By: Trudie Reed M.D.   On: 11/06/2022 07:39   MR 3D Recon At Scanner  Result Date: 11/06/2022 CLINICAL DATA:  67 year old male with history of pancreatic lesion. Follow-up study. EXAM: MRI ABDOMEN WITHOUT AND WITH CONTRAST (INCLUDING MRCP) TECHNIQUE: Multiplanar multisequence MR imaging of the abdomen was performed both before and after the administration of intravenous contrast. Heavily T2-weighted images of the biliary and pancreatic ducts were obtained, and three-dimensional MRCP images were rendered by post processing. CONTRAST:  9.45mL GADAVIST GADOBUTROL 1 MMOL/ML IV SOLN COMPARISON:  Abdominal MRI 08/06/2021. FINDINGS: Lower chest: Unremarkable. Hepatobiliary: Diffuse loss of signal intensity throughout the hepatic parenchyma on out of phase dual echo images, indicative of a background of hepatic steatosis. No suspicious cystic or solid hepatic lesions are noted. Status  post cholecystectomy. No intra or extrahepatic biliary ductal dilatation noted on MRCP images. Common bile duct measures 4 mm in the porta hepatis. No filling defect in the common bile duct to suggest choledocholithiasis. Pancreas: Well-circumscribed lesion in the tail of the pancreas (axial image 23 of series 24 and coronal image 22 of series 4) measures 2.3 x 1.9 x 1.9 cm and is T1 hypointense, T2 hyperintense, without definite internal soft tissue nodularity or substantial internal enhancement. The wall of the lesion demonstrates some low-level enhancement on post gadolinium images, similar to the prior study. This lesion does not appear to communicate with the main pancreatic duct. No other suspicious pancreatic lesions are noted. No pancreatic ductal dilatation. No peripancreatic fluid collections or inflammatory changes. Spleen:  Unremarkable. Adrenals/Urinary Tract: Subcentimeter T1 hypointense, T2 hyperintense, nonenhancing lesions in the left kidney are compatible with tiny simple cysts (Bosniak class 1, no imaging follow-up recommended). Right kidney and bilateral adrenal glands are otherwise normal in appearance. No hydroureteronephrosis in the visualized portions of the abdomen. Stomach/Bowel: Visualized portions are unremarkable. Vascular/Lymphatic: No aneurysm identified in the visualized abdominal vasculature. No lymphadenopathy noted in the abdomen. Other: No significant volume of ascites noted in the visualized portions of the peritoneal cavity. Musculoskeletal: No aggressive appearing osseous lesions are noted in the visualized portions of the skeleton. IMPRESSION: 1. Small cystic lesion in the tail of the pancreas, stable compared to the prior examination, favored to represent a benign lesion such as a small side branch IPMN (intraductal papillary mucinous neoplasm), or small pseudocyst. Repeat abdominal MRI with and without IV gadolinium with MRCP is recommended in 6 months to ensure continued  stability. This recommendation follows ACR consensus guidelines: Management of Incidental Pancreatic Cysts: A White Paper of the ACR Incidental Findings Committee. J Am Coll Radiol 2017;14:911-923. 2. Hepatic steatosis. Electronically Signed   By: Trudie Reed M.D.   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  Sanda Linger, MD

## 2023-04-09 NOTE — Progress Notes (Signed)
Placed order for BMP due in 1 week d/t K 3.2.

## 2023-04-09 NOTE — Patient Instructions (Signed)
Health Maintenance, Male Adopting a healthy lifestyle and getting preventive care are important in promoting health and wellness. Ask your health care provider about: The right schedule for you to have regular tests and exams. Things you can do on your own to prevent diseases and keep yourself healthy. What should I know about diet, weight, and exercise? Eat a healthy diet  Eat a diet that includes plenty of vegetables, fruits, low-fat dairy products, and lean protein. Do not eat a lot of foods that are high in solid fats, added sugars, or sodium. Maintain a healthy weight Body mass index (BMI) is a measurement that can be used to identify possible weight problems. It estimates body fat based on height and weight. Your health care provider can help determine your BMI and help you achieve or maintain a healthy weight. Get regular exercise Get regular exercise. This is one of the most important things you can do for your health. Most adults should: Exercise for at least 150 minutes each week. The exercise should increase your heart rate and make you sweat (moderate-intensity exercise). Do strengthening exercises at least twice a week. This is in addition to the moderate-intensity exercise. Spend less time sitting. Even light physical activity can be beneficial. Watch cholesterol and blood lipids Have your blood tested for lipids and cholesterol at 68 years of age, then have this test every 5 years. You may need to have your cholesterol levels checked more often if: Your lipid or cholesterol levels are high. You are older than 68 years of age. You are at high risk for heart disease. What should I know about cancer screening? Many types of cancers can be detected early and may often be prevented. Depending on your health history and family history, you may need to have cancer screening at various ages. This may include screening for: Colorectal cancer. Prostate cancer. Skin cancer. Lung  cancer. What should I know about heart disease, diabetes, and high blood pressure? Blood pressure and heart disease High blood pressure causes heart disease and increases the risk of stroke. This is more likely to develop in people who have high blood pressure readings or are overweight. Talk with your health care provider about your target blood pressure readings. Have your blood pressure checked: Every 3-5 years if you are 80-41 years of age. Every year if you are 31 years old or older. If you are between the ages of 89 and 69 and are a current or former smoker, ask your health care provider if you should have a one-time screening for abdominal aortic aneurysm (AAA). Diabetes Have regular diabetes screenings. This checks your fasting blood sugar level. Have the screening done: Once every three years after age 73 if you are at a normal weight and have a low risk for diabetes. More often and at a younger age if you are overweight or have a high risk for diabetes. What should I know about preventing infection? Hepatitis B If you have a higher risk for hepatitis B, you should be screened for this virus. Talk with your health care provider to find out if you are at risk for hepatitis B infection. Hepatitis C Blood testing is recommended for: Everyone born from [birth year] through 1966. Anyone with known risk factors for hepatitis C. Sexually transmitted infections (STIs) You should be screened each year for STIs, including gonorrhea and chlamydia, if: You are sexually active and are younger than 68 years of age. You are older than 68 years of age and your  health care provider tells you that you are at risk for this type of infection. Your sexual activity has changed since you were last screened, and you are at increased risk for chlamydia or gonorrhea. Ask your health care provider if you are at risk. Ask your health care provider about whether you are at high risk for HIV. Your health care provider  may recommend a prescription medicine to help prevent HIV infection. If you choose to take medicine to prevent HIV, you should first get tested for HIV. You should then be tested every 3 months for as long as you are taking the medicine. Follow these instructions at home: Alcohol use Do not drink alcohol if your health care provider tells you not to drink. If you drink alcohol: Limit how much you have to 0-2 drinks a day. Know how much alcohol is in your drink. In the [place] one drink equals one 12 oz bottle of beer (355 mL), one 5 oz glass of wine (148 mL), or one 1 oz glass of hard liquor (44 mL). Lifestyle Do not use any products that contain nicotine or tobacco. These products include cigarettes, chewing tobacco, and vaping devices, such as e-cigarettes. If you need help quitting, ask your health care provider. Do not use street drugs. Do not share needles. Ask your health care provider for help if you need support or information about quitting drugs. General instructions Schedule regular health, dental, and eye exams. Stay current with your vaccines. Tell your health care provider if: You often feel depressed. You have ever been abused or do not feel safe at home. Summary Adopting a healthy lifestyle and getting preventive care are important in promoting health and wellness. Follow your health care provider's instructions about healthy diet, exercising, and getting tested or screened for diseases. Follow your health care provider's instructions on monitoring your cholesterol and blood pressure. This information is not intended to replace advice given to you by your health care provider. Make sure you discuss any questions you have with your health care provider. Document Revised: 12/02/2020 Document Reviewed: 12/02/2020 Elsevier Patient Education  2024 [employer].

## 2023-04-10 ENCOUNTER — Encounter: Payer: Self-pay | Admitting: Internal Medicine

## 2023-04-10 DIAGNOSIS — Z0001 Encounter for general adult medical examination with abnormal findings: Secondary | ICD-10-CM | POA: Insufficient documentation

## 2023-04-10 MED ORDER — POTASSIUM CHLORIDE ER 15 MEQ PO TBCR
1.0000 | EXTENDED_RELEASE_TABLET | Freq: Three times a day (TID) | ORAL | 0 refills | Status: DC
Start: 2023-04-10 — End: 2023-09-03

## 2023-04-10 MED ORDER — ROSUVASTATIN CALCIUM 10 MG PO TABS: 10.0000 mg | ORAL_TABLET | Freq: Every day | ORAL | 1 refills | Status: DC

## 2023-04-10 MED ORDER — CARVEDILOL 6.25 MG PO TABS: 6.2500 mg | ORAL_TABLET | Freq: Two times a day (BID) | ORAL | 0 refills | Status: DC

## 2023-04-10 MED ORDER — CHOLECALCIFEROL 1.25 MG (50000 UT) PO CAPS
50000.0000 [IU] | ORAL_CAPSULE | ORAL | 0 refills | Status: DC
Start: 2023-04-10 — End: 2023-09-17

## 2023-04-10 MED ORDER — OLMESARTAN MEDOXOMIL 20 MG PO TABS: 20.0000 mg | ORAL_TABLET | Freq: Every day | ORAL | 0 refills | Status: DC

## 2023-04-10 MED ORDER — TRIAMTERENE-HCTZ 37.5-25 MG PO CAPS
1.0000 | ORAL_CAPSULE | Freq: Every day | ORAL | 0 refills | Status: DC
Start: 2023-04-10 — End: 2024-03-23

## 2023-04-10 MED ORDER — POTASSIUM CHLORIDE ER 15 MEQ PO TBCR: 1.0000 | EXTENDED_RELEASE_TABLET | Freq: Three times a day (TID) | ORAL | 0 refills | Status: DC

## 2023-04-10 NOTE — Assessment & Plan Note (Signed)
Exam completed, labs reviewed, vaccines reviewed and updated, cancer screenings addressed, pt ed material was given.

## 2023-04-12 ENCOUNTER — Other Ambulatory Visit: Payer: Self-pay | Admitting: Internal Medicine

## 2023-04-12 DIAGNOSIS — F322 Major depressive disorder, single episode, severe without psychotic features: Secondary | ICD-10-CM

## 2023-04-12 MED ORDER — DULOXETINE HCL 30 MG PO CPEP
30.0000 mg | ORAL_CAPSULE | Freq: Every day | ORAL | 0 refills | Status: DC
Start: 2023-04-12 — End: 2023-05-04

## 2023-04-12 MED ORDER — BUPROPION HCL ER (XL) 150 MG PO TB24
150.0000 mg | ORAL_TABLET | Freq: Every day | ORAL | 0 refills | Status: DC
Start: 2023-04-12 — End: 2023-05-04

## 2023-04-16 ENCOUNTER — Ambulatory Visit: Payer: Medicare HMO | Attending: Cardiology

## 2023-04-16 DIAGNOSIS — E876 Hypokalemia: Secondary | ICD-10-CM | POA: Diagnosis not present

## 2023-04-17 LAB — BASIC METABOLIC PANEL
BUN/Creatinine Ratio: 14 (ref 10–24)
BUN: 13 mg/dL (ref 8–27)
CO2: 24 mmol/L (ref 20–29)
Calcium: 9.8 mg/dL (ref 8.6–10.2)
Chloride: 98 mmol/L (ref 96–106)
Creatinine, Ser: 0.96 mg/dL (ref 0.76–1.27)
Glucose: 87 mg/dL (ref 70–99)
Potassium: 4.4 mmol/L (ref 3.5–5.2)
Sodium: 141 mmol/L (ref 134–144)
eGFR: 86 mL/min/{1.73_m2} (ref >59)

## 2023-05-04 ENCOUNTER — Other Ambulatory Visit: Payer: Self-pay | Admitting: Internal Medicine

## 2023-05-04 DIAGNOSIS — F322 Major depressive disorder, single episode, severe without psychotic features: Secondary | ICD-10-CM

## 2023-05-04 MED ORDER — BUPROPION HCL ER (XL) 150 MG PO TB24
150.0000 mg | ORAL_TABLET | Freq: Every day | ORAL | 0 refills | Status: DC
Start: 2023-05-04 — End: 2023-08-04

## 2023-05-04 MED ORDER — DULOXETINE HCL 60 MG PO CPEP
60.0000 mg | ORAL_CAPSULE | Freq: Every day | ORAL | 0 refills | Status: DC
Start: 2023-05-04 — End: 2023-08-04

## 2023-06-03 ENCOUNTER — Encounter: Payer: Self-pay | Admitting: Gastroenterology

## 2023-06-17 ENCOUNTER — Ambulatory Visit: Payer: Medicare HMO

## 2023-06-17 VITALS — Ht 72.0 in | Wt 233.0 lb

## 2023-06-17 DIAGNOSIS — Z0000 Encounter for general adult medical examination without abnormal findings: Secondary | ICD-10-CM | POA: Diagnosis not present
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
## 2023-06-17 NOTE — Patient Instructions (Signed)
[name] , Thank you for taking time to come for your Medicare Wellness Visit. I appreciate your ongoing commitment to your health goals. Please review the following plan we discussed and let me know if I can assist you in the future.   Referrals/Orders/Follow-Ups/Clinician Recommendations: You are due for a Shingles, Covid and a Pneumonia vaccine. Keep up the good work.    This is a list of the screening recommended for you and due dates:  Health Maintenance  Topic Date Due   Colon Cancer Screening  07/06/2020   Zoster (Shingles) Vaccine (2 of 2) 02/27/2022   COVID-19 Vaccine (6 - 2023-24 season) 03/28/2023   Pneumonia Vaccine (3 of 3 - PPSV23 or PCV20) 06/09/2023   Medicare Annual Wellness Visit  06/16/2024   DTaP/Tdap/Td vaccine (3 - Td or Tdap) 05/11/2032   Flu Shot  Completed   Hepatitis C Screening  Completed   HPV Vaccine  Aged Out    Advanced directives: (Copy Requested) Please bring a copy of your health care power of attorney and living will to the office to be added to your chart at your convenience.  Next Medicare Annual Wellness Visit scheduled for next year: Yes

## 2023-06-18 ENCOUNTER — Encounter: Payer: Self-pay | Admitting: Gastroenterology

## 2023-06-24 ENCOUNTER — Other Ambulatory Visit: Payer: Self-pay | Admitting: Internal Medicine

## 2023-06-24 DIAGNOSIS — I10 Essential (primary) hypertension: Secondary | ICD-10-CM

## 2023-07-19 ENCOUNTER — Ambulatory Visit (AMBULATORY_SURGERY_CENTER): Payer: Medicare HMO | Admitting: *Deleted

## 2023-07-19 VITALS — Ht 72.0 in | Wt 235.0 lb

## 2023-07-19 DIAGNOSIS — R112 Nausea with vomiting, unspecified: Secondary | ICD-10-CM

## 2023-07-19 DIAGNOSIS — K862 Cyst of pancreas: Secondary | ICD-10-CM

## 2023-07-19 NOTE — Progress Notes (Signed)
Pt's name and DOB verified at the beginning of the pre-visit wit 2 identifiers  Pt denies any difficulty with ambulating,sitting, laying down or rolling side to side  Pt has no issues with ambulation   Pt has no issues moving head neck or swallowing  No egg or soy allergy known to patient   No issues known to pt with past sedation with any surgeries or procedures  Pt denies having issues being intubated  Patient denies ever being intubated  No FH of Malignant Hyperthermia  Pt is not on diet pills or shots  Pt is not on home 02   Pt is not on blood thinners   Pt denies issues with constipation   Pt has frequent issues with constipation RN instructed pt to use Miralax per bottles instructions a week before prep days. Pt states they will  Pt is not on dialysis  Pt has hx of Afib  Pt denies any upcoming cardiac testing  Pt encouraged to use to use Singlecare or Goodrx to reduce cost   Patient's chart reviewed by Cathlyn Parsons CNRA prior to pre-visit and patient appropriate for the [facility].  Pre-visit completed and red dot placed by patient's name on their procedure day (on provider's schedule).  .  Visit by phone  Pt states weight is 235 lb  Instructed pt why it is important to and  to call if they have any changes in health or new medications. Directed them to the # given and on instructions.     Instructions reviewed. Pt given both [facility] main # and MD on call # prior to instructions.  Pt states understanding. Instructed to review again prior to procedure. Pt states they will.   Instructions sent by mail with coupon and by My Chart Coupon sent via text to mobile phone and pt verified they received it Pt isntructed to not do Marijuana day before procedure or day of procedure.

## 2023-08-03 ENCOUNTER — Encounter: Payer: Medicare HMO | Admitting: Gastroenterology

## 2023-08-03 ENCOUNTER — Other Ambulatory Visit: Payer: Self-pay | Admitting: Internal Medicine

## 2023-08-03 DIAGNOSIS — F322 Major depressive disorder, single episode, severe without psychotic features: Secondary | ICD-10-CM

## 2023-08-04 ENCOUNTER — Other Ambulatory Visit: Payer: Self-pay | Admitting: Internal Medicine

## 2023-08-04 DIAGNOSIS — F322 Major depressive disorder, single episode, severe without psychotic features: Secondary | ICD-10-CM

## 2023-08-05 ENCOUNTER — Ambulatory Visit (HOSPITAL_COMMUNITY)
Admission: RE | Admit: 2023-08-05 | Discharge: 2023-08-05 | Disposition: A | Discharge status: Home / Self Care | Payer: Medicare HMO | Source: Ambulatory Visit | Attending: Cardiology | Admitting: Cardiology

## 2023-08-05 DIAGNOSIS — Z9049 Acquired absence of other specified parts of digestive tract: Secondary | ICD-10-CM | POA: Diagnosis not present

## 2023-08-05 DIAGNOSIS — I7121 Aneurysm of the ascending aorta, without rupture: Secondary | ICD-10-CM | POA: Diagnosis not present

## 2023-08-05 DIAGNOSIS — I517 Cardiomegaly: Secondary | ICD-10-CM | POA: Diagnosis not present

## 2023-08-05 MED ORDER — IOHEXOL 350 MG/ML SOLN
75.0000 mL | Freq: Once | INTRAVENOUS | Status: AC | PRN
  Administered 2023-08-05: 75 mL via INTRAVENOUS

## 2023-08-05 MED ORDER — SODIUM CHLORIDE (PF) 0.9 % IJ SOLN
INTRAMUSCULAR | Status: AC
  Filled 2023-08-05: qty 50

## 2023-08-09 ENCOUNTER — Other Ambulatory Visit (HOSPITAL_COMMUNITY): Payer: Self-pay | Admitting: *Deleted

## 2023-08-09 DIAGNOSIS — I7121 Aneurysm of the ascending aorta, without rupture: Secondary | ICD-10-CM

## 2023-09-02 DIAGNOSIS — N182 Chronic kidney disease, stage 2 (mild): Secondary | ICD-10-CM | POA: Diagnosis not present

## 2023-09-03 ENCOUNTER — Other Ambulatory Visit: Payer: Self-pay | Admitting: Internal Medicine

## 2023-09-03 DIAGNOSIS — I10 Essential (primary) hypertension: Secondary | ICD-10-CM

## 2023-09-03 DIAGNOSIS — E876 Hypokalemia: Secondary | ICD-10-CM

## 2023-09-06 DIAGNOSIS — M170 Bilateral primary osteoarthritis of knee: Secondary | ICD-10-CM | POA: Diagnosis not present

## 2023-09-08 DIAGNOSIS — I129 Hypertensive chronic kidney disease with stage 1 through stage 4 chronic kidney disease, or unspecified chronic kidney disease: Secondary | ICD-10-CM | POA: Diagnosis not present

## 2023-09-08 DIAGNOSIS — R809 Proteinuria, unspecified: Secondary | ICD-10-CM | POA: Diagnosis not present

## 2023-09-08 DIAGNOSIS — N182 Chronic kidney disease, stage 2 (mild): Secondary | ICD-10-CM | POA: Diagnosis not present

## 2023-09-08 DIAGNOSIS — I7121 Aneurysm of the ascending aorta, without rupture: Secondary | ICD-10-CM | POA: Diagnosis not present

## 2023-09-09 ENCOUNTER — Other Ambulatory Visit: Payer: Self-pay | Admitting: Internal Medicine

## 2023-09-09 DIAGNOSIS — E785 Hyperlipidemia, unspecified: Secondary | ICD-10-CM

## 2023-09-09 DIAGNOSIS — I10 Essential (primary) hypertension: Secondary | ICD-10-CM

## 2023-09-13 DIAGNOSIS — M170 Bilateral primary osteoarthritis of knee: Secondary | ICD-10-CM | POA: Diagnosis not present

## 2023-09-15 ENCOUNTER — Encounter: Payer: Self-pay | Admitting: Gastroenterology

## 2023-09-16 ENCOUNTER — Encounter: Payer: Self-pay | Admitting: Internal Medicine

## 2023-09-17 ENCOUNTER — Emergency Department (HOSPITAL_COMMUNITY): Payer: Medicare HMO

## 2023-09-17 ENCOUNTER — Encounter (HOSPITAL_COMMUNITY): Payer: Self-pay

## 2023-09-17 ENCOUNTER — Emergency Department (HOSPITAL_COMMUNITY)
Admission: EM | Admit: 2023-09-17 | Discharge: 2023-09-17 | Disposition: A | Discharge status: Home / Self Care | Payer: Medicare HMO | Attending: Emergency Medicine | Admitting: Emergency Medicine

## 2023-09-17 ENCOUNTER — Ambulatory Visit (INDEPENDENT_AMBULATORY_CARE_PROVIDER_SITE_OTHER): Payer: Medicare HMO | Admitting: Family Medicine

## 2023-09-17 ENCOUNTER — Other Ambulatory Visit: Payer: Self-pay

## 2023-09-17 ENCOUNTER — Encounter: Payer: Self-pay | Admitting: Family Medicine

## 2023-09-17 VITALS — BP 209/108 | HR 92 | Temp 98.0°F | Ht 72.0 in | Wt 249.6 lb

## 2023-09-17 DIAGNOSIS — Z87891 Personal history of nicotine dependence: Secondary | ICD-10-CM | POA: Insufficient documentation

## 2023-09-17 DIAGNOSIS — R9431 Abnormal electrocardiogram [ECG] [EKG]: Secondary | ICD-10-CM

## 2023-09-17 DIAGNOSIS — I714 Abdominal aortic aneurysm, without rupture, unspecified: Secondary | ICD-10-CM | POA: Diagnosis not present

## 2023-09-17 DIAGNOSIS — R0602 Shortness of breath: Secondary | ICD-10-CM | POA: Diagnosis not present

## 2023-09-17 DIAGNOSIS — Z85828 Personal history of other malignant neoplasm of skin: Secondary | ICD-10-CM | POA: Insufficient documentation

## 2023-09-17 DIAGNOSIS — I499 Cardiac arrhythmia, unspecified: Secondary | ICD-10-CM

## 2023-09-17 DIAGNOSIS — I10 Essential (primary) hypertension: Secondary | ICD-10-CM | POA: Insufficient documentation

## 2023-09-17 DIAGNOSIS — G4733 Obstructive sleep apnea (adult) (pediatric): Secondary | ICD-10-CM

## 2023-09-17 DIAGNOSIS — I169 Hypertensive crisis, unspecified: Secondary | ICD-10-CM

## 2023-09-17 LAB — TROPONIN I (HIGH SENSITIVITY)
Troponin I (High Sensitivity): 29 ng/L — ABNORMAL HIGH (ref <18)
Troponin I (High Sensitivity): 32 ng/L — ABNORMAL HIGH (ref <18)

## 2023-09-17 LAB — COMPREHENSIVE METABOLIC PANEL
ALT: 33 U/L (ref 0–44)
AST: 25 U/L (ref 15–41)
Albumin: 4.1 g/dL (ref 3.5–5.0)
Alkaline Phosphatase: 55 U/L (ref 38–126)
Anion gap: 15 (ref 5–15)
BUN: 13 mg/dL (ref 8–23)
CO2: 27 mmol/L (ref 22–32)
Calcium: 9.7 mg/dL (ref 8.9–10.3)
Chloride: 100 mmol/L (ref 98–111)
Creatinine, Ser: 0.95 mg/dL (ref 0.61–1.24)
GFR, Estimated: >60 mL/min (ref >60)
Glucose, Bld: 95 mg/dL (ref 70–99)
Potassium: 3.6 mmol/L (ref 3.5–5.1)
Sodium: 142 mmol/L (ref 135–145)
Total Bilirubin: 0.8 mg/dL (ref 0.0–1.2)
Total Protein: 7.5 g/dL (ref 6.5–8.1)

## 2023-09-17 LAB — CBC WITH DIFFERENTIAL/PLATELET
Abs Immature Granulocytes: 0.05 10*3/uL (ref 0.00–0.07)
Basophils Absolute: 0.1 10*3/uL (ref 0.0–0.1)
Basophils Relative: 1 %
Eosinophils Absolute: 0 10*3/uL (ref 0.0–0.5)
Eosinophils Relative: 1 %
HCT: 43.2 % (ref 39.0–52.0)
Hemoglobin: 14.7 g/dL (ref 13.0–17.0)
Immature Granulocytes: 1 %
Lymphocytes Relative: 17 %
Lymphs Abs: 1.3 10*3/uL (ref 0.7–4.0)
MCH: 33.7 pg (ref 26.0–34.0)
MCHC: 34 g/dL (ref 30.0–36.0)
MCV: 99.1 fL (ref 80.0–100.0)
Monocytes Absolute: 0.5 10*3/uL (ref 0.1–1.0)
Monocytes Relative: 7 %
Neutro Abs: 5.8 10*3/uL (ref 1.7–7.7)
Neutrophils Relative %: 73 %
Platelets: 223 10*3/uL (ref 150–400)
RBC: 4.36 MIL/uL (ref 4.22–5.81)
RDW: 12.5 % (ref 11.5–15.5)
WBC: 7.8 10*3/uL (ref 4.0–10.5)
nRBC: 0 % (ref 0.0–0.2)

## 2023-09-17 LAB — RESP PANEL BY RT-PCR (RSV, FLU A&B, COVID)  RVPGX2
Influenza A by PCR: NEGATIVE
Influenza B by PCR: NEGATIVE
Resp Syncytial Virus by PCR: NEGATIVE
SARS Coronavirus 2 by RT PCR: NEGATIVE

## 2023-09-17 MED ORDER — LABETALOL HCL 5 MG/ML IV SOLN
10.0000 mg | Freq: Once | INTRAVENOUS | Status: AC
  Administered 2023-09-17: 10 mg via INTRAVENOUS
  Filled 2023-09-17: qty 4
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
## 2023-09-17 NOTE — Progress Notes (Addendum)
 Acute Office Visit  Subjective:     Patient ID: Henry Figueroa, male    DOB: Aug 07, 1955, 68 y.o.   MRN: [id number]  Chief Complaint  Patient presents with   Shortness of Breath    Shortness of Breath   Patient is in today for evaluation of shortness of breath. Has hx A flutter with ablation in 2019. Reports that he is feeling the way he did just prior to his ablation procedure.  Has taken tylenol, olmesartan, dyazide today. Reports symptoms for the last 4 days. Has not seen cardiology in over a year. Denies chest pain, numbness, tingling, loss of strength in extremities, arm pain, back pain, jaw pain, diaphoresis,    Review of Systems  Respiratory:  Positive for shortness of breath.    Per HPI      Objective:    BP (!) 209/108 (BP Location: Left Arm, Patient Position: Sitting)   Pulse 92   Temp 98 F (36.7 C) (Temporal)   Ht 6' (1.829 m)   Wt 249 lb 9.6 oz (113.2 kg)   SpO2 95%   BMI 33.85 kg/m    Physical Exam Vitals and nursing note reviewed.  Constitutional:      General: He is in acute distress.     Appearance: Normal appearance. He is obese.     Comments: Appears fatigued   HENT:     Head: Normocephalic and atraumatic.  Eyes:     Extraocular Movements: Extraocular movements intact.  Cardiovascular:     Rate and Rhythm: Normal rate. Rhythm irregular.     Pulses: Normal pulses.     Heart sounds: Normal heart sounds. No murmur heard. Pulmonary:     Effort: Pulmonary effort is normal. No respiratory distress.     Breath sounds: Normal breath sounds. No wheezing, rhonchi or rales.  Musculoskeletal:        General: Normal range of motion.     Cervical back: Normal range of motion.     Right lower leg: 1+ Pitting Edema present.     Left lower leg: 1+ Pitting Edema present.  Lymphadenopathy:     Cervical: No cervical adenopathy.  Skin:    General: Skin is warm and dry.  Neurological:     General: No focal deficit present.     Mental Status: He  is alert and oriented to person, place, and time.  Psychiatric:        Mood and Affect: Mood normal.        Behavior: Behavior normal.   ED ECG REPORT   Date: 09/17/23  EKG Time: 10:57 AM  Rate: 74  Rhythm: sinus arrhythmia,  unchanged from previous tracings  Intervals:none  ST&T Change: none    Narrative Interpretation: sinus arrhythmia   Results for orders placed or performed during the hospital encounter of 09/17/23  Resp panel by RT-PCR (RSV, Flu A&B, Covid) Anterior Nasal Swab   Specimen: Anterior Nasal Swab  Result Value Ref Range   SARS Coronavirus 2 by RT PCR NEGATIVE NEGATIVE   Influenza A by PCR NEGATIVE NEGATIVE   Influenza B by PCR NEGATIVE NEGATIVE   Resp Syncytial Virus by PCR NEGATIVE NEGATIVE  Comprehensive metabolic panel  Result Value Ref Range   Sodium 142 135 - 145 mmol/L   Potassium 3.6 3.5 - 5.1 mmol/L   Chloride 100 98 - 111 mmol/L   CO2 27 22 - 32 mmol/L   Glucose, Bld 95 70 - 99 mg/dL   BUN 13 8 -  23 mg/dL   Creatinine, Ser 2.44 0.61 - 1.24 mg/dL   Calcium 9.7 8.9 - 01.0 mg/dL   Total Protein 7.5 6.5 - 8.1 g/dL   Albumin 4.1 3.5 - 5.0 g/dL   AST 25 15 - 41 U/L   ALT 33 0 - 44 U/L   Alkaline Phosphatase 55 38 - 126 U/L   Total Bilirubin 0.8 0.0 - 1.2 mg/dL   GFR, Estimated >27 >25 mL/min   Anion gap 15 5 - 15  CBC with Differential  Result Value Ref Range   WBC 7.8 4.0 - 10.5 K/uL   RBC 4.36 4.22 - 5.81 MIL/uL   Hemoglobin 14.7 13.0 - 17.0 g/dL   HCT 36.6 44.0 - 34.7 %   MCV 99.1 80.0 - 100.0 fL   MCH 33.7 26.0 - 34.0 pg   MCHC 34.0 30.0 - 36.0 g/dL   RDW 42.5 95.6 - 38.7 %   Platelets 223 150 - 400 K/uL   nRBC 0.0 0.0 - 0.2 %   Neutrophils Relative % 73 %   Neutro Abs 5.8 1.7 - 7.7 K/uL   Lymphocytes Relative 17 %   Lymphs Abs 1.3 0.7 - 4.0 K/uL   Monocytes Relative 7 %   Monocytes Absolute 0.5 0.1 - 1.0 K/uL   Eosinophils Relative 1 %   Eosinophils Absolute 0.0 0.0 - 0.5 K/uL   Basophils Relative 1 %   Basophils Absolute 0.1 0.0  - 0.1 K/uL   Immature Granulocytes 1 %   Abs Immature Granulocytes 0.05 0.00 - 0.07 K/uL  Troponin I (High Sensitivity)  Result Value Ref Range   Troponin I (High Sensitivity) 29 (H) <18 ng/L  Troponin I (High Sensitivity)  Result Value Ref Range   Troponin I (High Sensitivity) 32 (H) <18 ng/L        Assessment & Plan:  1. SOB (shortness of breath) (Primary)  - EKG 12-Lead  2. Cardiac arrhythmia, unspecified cardiac arrhythmia type  - EKG 12-Lead  3. Abdominal aortic aneurysm (AAA) without rupture, unspecified part (HCC)  - EKG 12-Lead  4. Hypertensive crisis  - EKG 12-Lead  5. Abnormal EKG  - EKG 12-Lead  6. OSA (obstructive sleep apnea)  - EKG 12-Lead  Discussed that he would best be served in the ED for further evaluation and treatment given clinical presentation, hypertensive crisis Strongly advised EMS to hospital, declined. Sister here to drive him to the ED, states that they will go directly there.    No orders of the defined types were placed in this encounter.   Return for as scheduled with PCP.  Moshe Cipro, FNP

## 2023-09-17 NOTE — ED Provider Triage Note (Signed)
Emergency Medicine Provider Triage Evaluation Note  Henry Figueroa , a 68 y.o. male  was evaluated in triage.  Pt complains of shortness of breath for the past week.  Patient states he has missed his blood pressure meds to out of the 7 days in the previous week.  Patient states he sometimes coughs up clear sputum and that he gave up smoking 3 months ago.  Patient does not have any chest pain.  Patient went to urgent care and was told he was in a sinus arrhythmia and was told to come to emergency department for further workup.  Patient denies any recent travel, hemoptysis, leg swelling, calf tenderness, previous blood clots, abdominal pain, chest pain.  Patient states that shortness of breath comes and goes without obvious trigger and denies any sick contacts, fevers.  Sister at bedside who is previous PA states that patient was feeling ill last week with possible viral bug.  Review of Systems  Positive:  Negative:   Physical Exam  BP (!) 213/119   Pulse 98   Temp 98.5 F (36.9 C) (Oral)   Resp 20   Ht 6' (1.829 m)   Wt 113.2 kg   SpO2 98%   BMI 33.85 kg/m  Gen:   Awake, no distress   Resp:  Normal effort  MSK:   Moves extremities without difficulty  Other:  Regular rate and rhythm, 2+ pulse radially, no pulsating abdominal mass or abdominal tenderness peritoneal signs, lungs clear to auscultation bilaterally, speaking full sentences  Medical Decision Making  Medically screening exam initiated at 1:32 PM.  Appropriate orders placed.  Lindell Spar was informed that the remainder of the evaluation will be completed by another provider, this initial triage assessment does not replace that evaluation, and the importance of remaining in the ED until their evaluation is complete.  Workup initiated, patient was in absolutely no distress and had reassuring physical exam.  Patient was noted to be hypertensive at 213/119 however does sound as if there is some medical noncompliance with his blood  pressure meds.  Patient did have CTA done in January that shows a 4.9 cm ascending aorta which is up from 4.8 cm previously.  Patient has no chest pain and endorsing red flag symptoms but sounds as if he was sick last week with a viral illness and so we will check respiratory panel and get basic labs and imaging.  Labetalol ordered for patient's blood pressure.  EKG from urgent care is evaluated by myself and shows sinus arrhythmia with mild tachycardia however no signs of A-fib or a flutter.  Patient is not currently anticoagulated.   Netta Corrigan, PA-C 09/17/23 1335

## 2023-09-17 NOTE — Discharge Instructions (Addendum)
Henry Figueroa:  Thank you for allowing [name] to take care of you today.  We hope you begin feeling better soon.  To-Do: Please follow-up with Cardiology with Dr. Anne Fu.  Please call their office directly at  [phone]    to schedule an appointment. Please return to the Emergency Department or call 911 if you experience chest pain, shortness of breath, severe pain, severe fever, altered mental status, or have any reason to think that you need emergency medical care.  Thank you again.  Hope you feel better soon.  Department of Emergency Medicine [facility]

## 2023-09-17 NOTE — ED Triage Notes (Signed)
Patient BIB sister from Drs office for help with BP control, BP 213/119 here and similar in office. Patient has hx of aortic aneurysm and arrhythmia, patient is asymptomatic for BP but reports palpitations but currently in sinus arrhythmia.

## 2023-09-17 NOTE — Patient Instructions (Addendum)
Concern for rate controlled a flutter with hx ablation in 2019  SOB with arrythmia on auscultation  Hx aortic aneurysm  Hypertensive crisis today with BP 200/110  Please go directly to the ER for further evaluation and treatment.

## 2023-09-17 NOTE — Telephone Encounter (Signed)
Patient has been scheduled to be seen today.

## 2023-09-20 ENCOUNTER — Encounter: Payer: Self-pay | Admitting: Cardiology

## 2023-09-20 ENCOUNTER — Encounter: Payer: Medicare HMO | Admitting: Gastroenterology

## 2023-09-20 NOTE — Telephone Encounter (Signed)
Error

## 2023-09-23 DIAGNOSIS — M170 Bilateral primary osteoarthritis of knee: Secondary | ICD-10-CM | POA: Diagnosis not present

## 2023-09-24 ENCOUNTER — Encounter: Payer: Self-pay | Admitting: Cardiology

## 2023-09-24 ENCOUNTER — Ambulatory Visit: Payer: Medicare HMO | Attending: Cardiology | Admitting: Cardiology

## 2023-09-24 VITALS — BP 142/98 | HR 105 | Ht 72.0 in | Wt 236.4 lb

## 2023-09-24 DIAGNOSIS — I7121 Aneurysm of the ascending aorta, without rupture: Secondary | ICD-10-CM

## 2023-09-24 DIAGNOSIS — I483 Typical atrial flutter: Secondary | ICD-10-CM

## 2023-09-24 NOTE — Progress Notes (Signed)
 Cardiology Office Note:  .   Date:  09/24/2023  ID:  Henry Figueroa, DOB [birth year], MRN [id number] PCP: Etta Grandchild, MD  [facility] Providers Cardiologist:  Donato Schultz, MD    History of Present Illness: .   Henry Figueroa is a 68 y.o. male Discussed the use of AI scribe software for clinical note transcription with the patient, who gave verbal consent to proceed.  History of Present Illness Henry Figueroa is a 68 year old male with aortic aneurysm who presents for follow-up. He is accompanied by Darl Pikes, his partner.  He has a history of aortic aneurysm, initially measured at 48 millimeters, which has increased to 4.9 centimeters as per a recent CT scan on August 05, 2023. During a recent emergency department visit on September 17, 2023, he was asymptomatic and tested negative for COVID-19 and RSV.  He has a history of atrial fibrillation, previously thought to be the cause of his shortness of breath. He describes episodes where he feels like he is 'drowning without water,' lasting for one or two breaths, though these have not occurred in the past week. A recent EKG showed sinus rhythm with possible PACs, but no atrial fibrillation was detected.  He experiences gastrointestinal issues, including vomiting triggered by sneezes, coughs, or swallowing pills. About a month ago, he was vomiting almost daily, particularly when taking medications, leading to a temporary cessation of his medication regimen. He lost 35 pounds during this period due to not eating for up to four days at a time. He has since resumed his medications and is scheduled to see a gastroenterologist on November 01, 2023, for further evaluation.  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 He mentions the impact of the pandemic on his work, with closures affecting his ability to perform his job. He also enjoys spending time with his kittens and  cats.       Studies Reviewed: .        Results RADIOLOGY CT scan: 4.9 cm aortic aneurysm (08/05/2023)  DIAGNOSTIC EKG: Sinus rhythm, premature atrial contraction (PAC) (09/17/2023) Risk Assessment/Calculations:           Physical Exam:   VS:  BP (!) 142/98   Pulse (!) 105   Ht 6' (1.829 m)   Wt 236 lb 6.4 oz (107.2 kg)   SpO2 97%   BMI 32.06 kg/m    Wt Readings from Last 3 Encounters:  09/24/23 236 lb 6.4 oz (107.2 kg)  09/17/23 249 lb 9.6 oz (113.2 kg)  09/17/23 249 lb 9.6 oz (113.2 kg)    GEN: Well nourished, well developed in no acute distress NECK: No JVD; No carotid bruits CARDIAC: RRR, no murmurs, no rubs, no gallops RESPIRATORY:  Clear to auscultation without rales, wheezing or rhonchi  ABDOMEN: Soft, non-tender, non-distended EXTREMITIES:  No edema; No deformity   ASSESSMENT AND PLAN: .    Assessment and Plan Assessment & Plan Aortic Aneurysm Aortic aneurysm measuring 4.9 cm on CT scan dated 08/05/2023, increased from 4.8 cm in 2019. Asymptomatic. Discussed regular monitoring to prevent rupture, which has a high mortality rate. Surgical intervention considered at 5.5 cm or if symptomatic. - Repeat CT scan in one year  Atrial Flutter Status Post Ablation Atrial flutter treated with ablation in 2019. Recent EKG showed sinus rhythm with PACs. No recurrence of atrial flutter symptoms. Discussed low likelihood of recurrence post-ablation but emphasized regular monitoring. - Continue monitoring  for recurrence of atrial flutter  Hypertension Intermittent hypertension, noted during recent ER visit. Non-compliance with antihypertensive medications due to gastrointestinal side effects. Discussed risks of uncontrolled hypertension, including stroke and heart attack. Emphasized medication adherence and managing side effects with GI specialist. - Encourage adherence to antihypertensive medications - Monitor blood pressure regularly  Gastrointestinal  Symptoms Intermittent vomiting triggered by sneezing, coughing, or swallowing pills. History of gastrointestinal issues and significant weight loss. Follow-up with GI specialist scheduled for 11/01/2023. Discussed potential underlying conditions such as ulcers or other GI disorders. Emphasized importance of follow-up to manage symptoms and prevent further weight loss. - Follow-up with GI specialist on 11/01/2023  General Health Maintenance Discussed importance of flu vaccination, especially given recent exposure to flu through his wife. Emphasized benefits of vaccination in preventing severe illness. - Encourage flu vaccination  Follow-up - Follow-up CT scan in one year - GI specialist follow-up on 11/01/2023.          Signed, Donato Schultz, MD

## 2023-09-24 NOTE — Patient Instructions (Signed)
 Medication Instructions:  The current medical regimen is effective;  continue present plan and medications.  *If you need a refill on your cardiac medications before your next appointment, please call your pharmacy*  Follow-Up: At [facility], you and your health needs are our priority.  As part of our continuing mission to provide you with exceptional heart care, we have created designated Provider Care Teams.  These Care Teams include your primary Cardiologist (physician) and Advanced Practice Providers (APPs -  Physician Assistants and Nurse Practitioners) who all work together to provide you with the care you need, when you need it.  We recommend signing up for the patient portal called "MyChart".  Sign up information is provided on this After Visit Summary.  MyChart is used to connect with patients for Virtual Visits (Telemedicine).  Patients are able to view lab/test results, encounter notes, upcoming appointments, etc.  Non-urgent messages can be sent to your provider as well.   To learn more about what you can do with MyChart, go to [email or web address].    Your next appointment:   1 year(s)  Provider:   Donato Schultz, MD           1st Floor: - Lobby - Registration  - Pharmacy  - Lab - Cafe  2nd Floor: - PV Lab - Diagnostic Testing (echo, CT, nuclear med)  3rd Floor: - Vacant  4th Floor: - TCTS (cardiothoracic surgery) - AFib Clinic - Structural Heart Clinic - Vascular Surgery  - Vascular Ultrasound  5th Floor: - HeartCare Cardiology (general and EP) - Clinical Pharmacy for coumadin, hypertension, lipid, weight-loss medications, and med management appointments    Valet parking services will be available as well.

## 2023-09-27 DIAGNOSIS — M545 Low back pain, unspecified: Secondary | ICD-10-CM | POA: Diagnosis not present

## 2023-09-27 DIAGNOSIS — M25551 Pain in right hip: Secondary | ICD-10-CM | POA: Diagnosis not present

## 2023-10-05 DIAGNOSIS — M545 Low back pain, unspecified: Secondary | ICD-10-CM | POA: Diagnosis not present

## 2023-10-05 DIAGNOSIS — M47896 Other spondylosis, lumbar region: Secondary | ICD-10-CM | POA: Diagnosis not present

## 2023-10-13 ENCOUNTER — Other Ambulatory Visit: Payer: Self-pay

## 2023-10-13 ENCOUNTER — Ambulatory Visit (AMBULATORY_SURGERY_CENTER): Payer: Medicare HMO

## 2023-10-13 VITALS — Ht 72.0 in | Wt 234.0 lb

## 2023-10-13 DIAGNOSIS — K2270 Barrett's esophagus without dysplasia: Secondary | ICD-10-CM

## 2023-10-13 NOTE — Progress Notes (Signed)
 Denies allergies to eggs or soy products. Denies complication of anesthesia or sedation. Denies use of weight loss medication. Denies use of O2.   Emmi instructions given for colonoscopy.

## 2023-10-21 DIAGNOSIS — M545 Low back pain, unspecified: Secondary | ICD-10-CM | POA: Diagnosis not present

## 2023-10-21 DIAGNOSIS — M47896 Other spondylosis, lumbar region: Secondary | ICD-10-CM | POA: Diagnosis not present

## 2023-10-28 ENCOUNTER — Encounter: Payer: Self-pay | Admitting: Gastroenterology

## 2023-11-01 ENCOUNTER — Encounter: Payer: Self-pay | Admitting: Gastroenterology

## 2023-11-01 ENCOUNTER — Ambulatory Visit (AMBULATORY_SURGERY_CENTER): Payer: Medicare HMO | Admitting: Gastroenterology

## 2023-11-01 VITALS — BP 178/88 | HR 64 | Temp 98.2°F | Resp 25 | Ht 72.0 in | Wt 234.0 lb

## 2023-11-01 DIAGNOSIS — K2270 Barrett's esophagus without dysplasia: Secondary | ICD-10-CM | POA: Diagnosis not present

## 2023-11-01 DIAGNOSIS — K2970 Gastritis, unspecified, without bleeding: Secondary | ICD-10-CM | POA: Diagnosis not present

## 2023-11-01 DIAGNOSIS — K319 Disease of stomach and duodenum, unspecified: Secondary | ICD-10-CM | POA: Diagnosis not present

## 2023-11-01 DIAGNOSIS — G4733 Obstructive sleep apnea (adult) (pediatric): Secondary | ICD-10-CM | POA: Diagnosis not present

## 2023-11-01 DIAGNOSIS — K2100 Gastro-esophageal reflux disease with esophagitis, without bleeding: Secondary | ICD-10-CM

## 2023-11-01 DIAGNOSIS — K3189 Other diseases of stomach and duodenum: Secondary | ICD-10-CM

## 2023-11-01 MED ORDER — PANTOPRAZOLE SODIUM 40 MG PO TBEC
40.0000 mg | DELAYED_RELEASE_TABLET | Freq: Every day | ORAL | 3 refills | Status: AC
Start: 2023-11-01 — End: ?

## 2023-11-01 MED ORDER — SODIUM CHLORIDE 0.9 % IV SOLN: 500.0000 mL | Freq: Once | INTRAVENOUS | Status: DC

## 2023-11-01 NOTE — Op Note (Signed)
 [facility] Patient Name: Henry Figueroa Procedure Date: 11/01/2023 9:46 AM MRN: [id number] Endoscopist: Doristine Locks , MD, [id number] Age: 68 Referring MD:  Date of Birth: 1955/11/03 Gender: Male Account #: [id number] Procedure:                Upper GI endoscopy Indications:              Esophageal reflux, Surveillance for malignancy due                            to personal history of Barrett's esophagus Medicines:                Monitored Anesthesia Care Procedure:                Pre-Anesthesia Assessment:                           - Prior to the procedure, a History and Physical                            was performed, and patient medications and                            allergies were reviewed. The patient's tolerance of                            previous anesthesia was also reviewed. The risks                            and benefits of the procedure and the sedation                            options and risks were discussed with the patient.                            All questions were answered, and informed consent                            was obtained. Prior Anticoagulants: The patient has                            taken no anticoagulant or antiplatelet agents. ASA                            Grade Assessment: II - A patient with mild systemic                            disease. After reviewing the risks and benefits,                            the patient was deemed in satisfactory condition to                            undergo the procedure.  After obtaining informed consent, the endoscope was                            passed under direct vision. Throughout the                            procedure, the patient's blood pressure, pulse, and                            oxygen saturations were monitored continuously. The                            GIF [id number] #[id number] was introduced through the                            mouth, and  advanced to the second part of duodenum.                            The upper GI endoscopy was accomplished without                            difficulty. The patient tolerated the procedure                            well. Scope In: Scope Out: Findings:                 The upper third of the esophagus and middle third                            of the esophagus were normal.                           LA Grade A (one or more mucosal breaks less than 5                            mm, not extending between tops of 2 mucosal folds)                            esophagitis with no bleeding was found in the lower                            third of the esophagus.                           One tongue of salmon-colored mucosa was present                            from 42 to 44 cm. The maximum longitudinal extent                            of these esophageal mucosal changes was 2 cm in  length. Biopsies were taken with a cold forceps for                            histology. Estimated blood loss was minimal.                           Mild inflammation characterized by congestion                            (edema) and erythema was found in the gastric body,                            at the incisura and in the gastric antrum. Biopsies                            were taken with a cold forceps for Helicobacter                            pylori testing. Estimated blood loss was minimal.                           The examined duodenum was normal. Complications:            No immediate complications. Estimated Blood Loss:     Estimated blood loss was minimal. Impression:               - Normal upper third of esophagus and middle third                            of esophagus.                           - LA Grade A reflux esophagitis with no bleeding.                           - Salmon-colored mucosa classified as Barrett's                            stage C0-M2 per [place]  criteria. Biopsied.                           - Mild, non-ulcer gastritis. Biopsied.                           - Normal examined duodenum. Recommendation:           - Patient has a contact number available for                            emergencies. The signs and symptoms of potential                            delayed complications were discussed with the  patient. Return to normal activities tomorrow.                            Written discharge instructions were provided to the                            patient.                           - Resume previous diet.                           - Continue present medications.                           - Await pathology results.                           - Restart Protonix (pantoprazole) 40 mg PO daily. Doristine Locks, MD 11/01/2023 10:05:17 AM

## 2023-11-01 NOTE — Progress Notes (Signed)
 Report to PACU, RN, vss, BBS= Clear.

## 2023-11-01 NOTE — Patient Instructions (Addendum)
 Resume previous diet.                           - Continue present medications.                           - Await pathology results.                           - Restart Protonix (pantoprazole) 40 mg PO daily.    YOU HAD AN ENDOSCOPIC PROCEDURE TODAY AT THE [facility]:   Refer to the procedure report that was given to you for any specific questions about what was found during the examination.  If the procedure report does not answer your questions, please call your gastroenterologist to clarify.  If you requested that your care partner not be given the details of your procedure findings, then the procedure report has been included in a sealed envelope for you to review at your convenience later.  YOU SHOULD EXPECT: Some feelings of bloating in the abdomen. Passage of more gas than usual.  Walking can help get rid of the air that was put into your GI tract during the procedure and reduce the bloating. If you had a lower endoscopy (such as a colonoscopy or flexible sigmoidoscopy) you may notice spotting of blood in your stool or on the toilet paper. If you underwent a bowel prep for your procedure, you may not have a normal bowel movement for a few days.  Please Note:  You might notice some irritation and congestion in your nose or some drainage.  This is from the oxygen used during your procedure.  There is no need for concern and it should clear up in a day or so.  SYMPTOMS TO REPORT IMMEDIATELY:   Following upper endoscopy (EGD)  Vomiting of blood or coffee ground material  New chest pain or pain under the shoulder blades  Painful or persistently difficult swallowing  New shortness of breath  Fever of 100F or higher  Black, tarry-looking stools  For urgent or emergent issues, a gastroenterologist can be reached at any hour by calling [phone]. Do not use MyChart messaging for urgent concerns.    DIET:  We do recommend a small meal at first, but then you may proceed to  your regular diet.  Drink plenty of fluids but you should avoid alcoholic beverages for 24 hours.  ACTIVITY:  You should plan to take it easy for the rest of today and you should NOT DRIVE or use heavy machinery until tomorrow (because of the sedation medicines used during the test).    FOLLOW UP: Our staff will call the number listed on your records the next business day following your procedure.  We will call around 7:15- 8:00 am to check on you and address any questions or concerns that you may have regarding the information given to you following your procedure. If we do not reach you, we will leave a message.     If any biopsies were taken you will be contacted by phone or by letter within the next 1-3 weeks.  Please call us at [phone] if you have not heard about the biopsies in 3 weeks.    SIGNATURES/CONFIDENTIALITY: You and/or your care partner have signed paperwork which will be entered into your electronic medical record.  These signatures attest to the fact that  that the information above on your After Visit Summary has been reviewed and is understood.  Full responsibility of the confidentiality of this discharge information lies with you and/or your care-partner.

## 2023-11-01 NOTE — Progress Notes (Signed)
 Pt's states no medical or surgical changes since previsit or office visit.
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
## 2023-11-02 ENCOUNTER — Telehealth: Payer: Self-pay | Admitting: *Deleted

## 2023-11-02 DIAGNOSIS — M545 Low back pain, unspecified: Secondary | ICD-10-CM | POA: Diagnosis not present

## 2023-11-02 DIAGNOSIS — M47896 Other spondylosis, lumbar region: Secondary | ICD-10-CM | POA: Diagnosis not present

## 2023-11-02 NOTE — Telephone Encounter (Signed)
  Follow up Call-     11/01/2023    9:35 AM  Call back number  Post procedure Call Back phone  # [phone]  Permission to leave phone message Yes     Patient questions:  Do you have a fever, pain , or abdominal swelling? No. Pain Score  0 *  Have you tolerated food without any problems? Yes.    Have you been able to return to your normal activities? Yes.    Do you have any questions about your discharge instructions: Diet   No. Medications  No. Follow up visit  No.  Do you have questions or concerns about your Care? No.  Actions: * If pain score is 4 or above: No action needed, pain <4.

## 2023-11-03 ENCOUNTER — Encounter: Payer: Self-pay | Admitting: Gastroenterology

## 2023-11-03 LAB — SURGICAL PATHOLOGY

## 2023-11-04 ENCOUNTER — Other Ambulatory Visit: Payer: Self-pay | Admitting: Internal Medicine

## 2023-11-04 DIAGNOSIS — F322 Major depressive disorder, single episode, severe without psychotic features: Secondary | ICD-10-CM

## 2023-11-08 DIAGNOSIS — M47896 Other spondylosis, lumbar region: Secondary | ICD-10-CM | POA: Diagnosis not present

## 2023-11-08 DIAGNOSIS — M545 Low back pain, unspecified: Secondary | ICD-10-CM | POA: Diagnosis not present

## 2023-11-15 DIAGNOSIS — M47816 Spondylosis without myelopathy or radiculopathy, lumbar region: Secondary | ICD-10-CM | POA: Diagnosis not present

## 2023-11-16 ENCOUNTER — Other Ambulatory Visit: Payer: Self-pay | Admitting: Physical Medicine and Rehabilitation

## 2023-11-16 DIAGNOSIS — M545 Low back pain, unspecified: Secondary | ICD-10-CM

## 2023-11-17 DIAGNOSIS — M47896 Other spondylosis, lumbar region: Secondary | ICD-10-CM | POA: Diagnosis not present

## 2023-11-17 DIAGNOSIS — M545 Low back pain, unspecified: Secondary | ICD-10-CM | POA: Diagnosis not present

## 2023-11-21 ENCOUNTER — Other Ambulatory Visit: Payer: Self-pay | Admitting: Internal Medicine

## 2023-11-21 DIAGNOSIS — F322 Major depressive disorder, single episode, severe without psychotic features: Secondary | ICD-10-CM

## 2023-11-22 ENCOUNTER — Other Ambulatory Visit: Payer: Self-pay | Admitting: Internal Medicine

## 2023-11-22 DIAGNOSIS — M545 Low back pain, unspecified: Secondary | ICD-10-CM | POA: Diagnosis not present

## 2023-11-22 DIAGNOSIS — E785 Hyperlipidemia, unspecified: Secondary | ICD-10-CM

## 2023-11-22 DIAGNOSIS — I10 Essential (primary) hypertension: Secondary | ICD-10-CM

## 2023-11-22 DIAGNOSIS — M47896 Other spondylosis, lumbar region: Secondary | ICD-10-CM | POA: Diagnosis not present

## 2023-11-28 ENCOUNTER — Ambulatory Visit
Admission: RE | Admit: 2023-11-28 | Discharge: 2023-11-28 | Discharge status: Home / Self Care | Source: Ambulatory Visit | Attending: Physical Medicine and Rehabilitation | Admitting: Physical Medicine and Rehabilitation

## 2023-11-28 DIAGNOSIS — M545 Low back pain, unspecified: Secondary | ICD-10-CM

## 2023-11-28 DIAGNOSIS — M48061 Spinal stenosis, lumbar region without neurogenic claudication: Secondary | ICD-10-CM | POA: Diagnosis not present

## 2023-12-07 ENCOUNTER — Other Ambulatory Visit: Payer: Self-pay

## 2023-12-26 ENCOUNTER — Encounter: Payer: Self-pay | Admitting: Gastroenterology

## 2023-12-30 DIAGNOSIS — M47816 Spondylosis without myelopathy or radiculopathy, lumbar region: Secondary | ICD-10-CM | POA: Diagnosis not present

## 2023-12-31 ENCOUNTER — Encounter: Payer: Self-pay | Admitting: Gastroenterology

## 2023-12-31 ENCOUNTER — Other Ambulatory Visit (INDEPENDENT_AMBULATORY_CARE_PROVIDER_SITE_OTHER)

## 2023-12-31 ENCOUNTER — Ambulatory Visit: Admitting: Gastroenterology

## 2023-12-31 VITALS — BP 126/74 | HR 90 | Ht 72.0 in | Wt 208.1 lb

## 2023-12-31 DIAGNOSIS — K219 Gastro-esophageal reflux disease without esophagitis: Secondary | ICD-10-CM

## 2023-12-31 DIAGNOSIS — R634 Abnormal weight loss: Secondary | ICD-10-CM | POA: Diagnosis not present

## 2023-12-31 DIAGNOSIS — R112 Nausea with vomiting, unspecified: Secondary | ICD-10-CM | POA: Diagnosis not present

## 2023-12-31 DIAGNOSIS — K862 Cyst of pancreas: Secondary | ICD-10-CM

## 2023-12-31 DIAGNOSIS — K2100 Gastro-esophageal reflux disease with esophagitis, without bleeding: Secondary | ICD-10-CM | POA: Diagnosis not present

## 2023-12-31 LAB — CBC WITH DIFFERENTIAL/PLATELET
Basophils Absolute: 0.1 10*3/uL (ref 0.0–0.1)
Basophils Relative: 0.5 % (ref 0.0–3.0)
Eosinophils Absolute: 0 10*3/uL (ref 0.0–0.7)
Eosinophils Relative: 0.3 % (ref 0.0–5.0)
HCT: 49.4 % (ref 39.0–52.0)
Hemoglobin: 16.8 g/dL (ref 13.0–17.0)
Lymphocytes Relative: 19.2 % (ref 12.0–46.0)
Lymphs Abs: 2 10*3/uL (ref 0.7–4.0)
MCHC: 34 g/dL (ref 30.0–36.0)
MCV: 99.5 fl (ref 78.0–100.0)
Monocytes Absolute: 1 10*3/uL (ref 0.1–1.0)
Monocytes Relative: 9.1 % (ref 3.0–12.0)
Neutro Abs: 7.6 10*3/uL (ref 1.4–7.7)
Neutrophils Relative %: 70.9 % (ref 43.0–77.0)
Platelets: 254 10*3/uL (ref 150.0–400.0)
RBC: 4.97 Mil/uL (ref 4.22–5.81)
RDW: 14.6 % (ref 11.5–15.5)
WBC: 10.7 10*3/uL — ABNORMAL HIGH (ref 4.0–10.5)

## 2023-12-31 LAB — COMPREHENSIVE METABOLIC PANEL WITH GFR
ALT: 35 U/L (ref 0–53)
AST: 31 U/L (ref 0–37)
Albumin: 5 g/dL (ref 3.5–5.2)
Alkaline Phosphatase: 59 U/L (ref 39–117)
BUN: 40 mg/dL — ABNORMAL HIGH (ref 6–23)
CO2: 26 meq/L (ref 19–32)
Calcium: 10.5 mg/dL (ref 8.4–10.5)
Chloride: 90 meq/L — ABNORMAL LOW (ref 96–112)
Creatinine, Ser: 1.66 mg/dL — ABNORMAL HIGH (ref 0.40–1.50)
GFR: 41.89 mL/min — ABNORMAL LOW (ref >60.00)
Glucose, Bld: 144 mg/dL — ABNORMAL HIGH (ref 70–99)
Potassium: 3.5 meq/L (ref 3.5–5.1)
Sodium: 135 meq/L (ref 135–145)
Total Bilirubin: 0.8 mg/dL (ref 0.2–1.2)
Total Protein: 8.5 g/dL — ABNORMAL HIGH (ref 6.0–8.3)

## 2023-12-31 MED ORDER — ONDANSETRON 4 MG PO TBDP: 4.0000 mg | ORAL_TABLET | Freq: Three times a day (TID) | ORAL | 1 refills | Status: DC | PRN
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
## 2023-12-31 NOTE — Patient Instructions (Addendum)
 Restart Protonix  40 mg po daily Take Zofran  4 mg once to twice daily for nausea Drink plenty of fluids Protein shakes three times daily  Your provider has requested that you go to the basement level for lab work before leaving today. Press "B" on the elevator. The lab is located at the first door on the left as you exit the elevator.   _______________________________________________________  If your blood pressure at your visit was 140/90 or greater, please contact your primary care physician to follow up on this.  _______________________________________________________  If you are age 50 or older, your body mass index should be between 23-30. Your Body mass index is 28.22 kg/m. If this is out of the aforementioned range listed, please consider follow up with your Primary Care Provider.  If you are age 75 or younger, your body mass index should be between 19-25. Your Body mass index is 28.22 kg/m. If this is out of the aformentioned range listed, please consider follow up with your Primary Care Provider.   ________________________________________________________  The [facility] providers would like to encourage you to use MYCHART to communicate with providers for non-urgent requests or questions.  Due to long hold times on the telephone, sending your provider a message by [facility] may be a faster and more efficient way to get a response.  Please allow 48 business hours for a response.  Please remember that this is for non-urgent requests.  _______________________________________________________   Thank you for trusting me with your gastrointestinal care. Deanna May, RNP

## 2024-01-04 NOTE — Progress Notes (Signed)
 Agree with the assessment and plan as outlined by [facility], FNP-C.   I suppose we could do gastric emptying study as this has not yet been done.  Otherwise has had an extensive evaluation for his nausea/vomiting.  Based on my prior conversation with him and your evaluation, seems like he may have periods of time where he has no nausea.  If that is the case, may entertain diagnosis of cyclic vomiting syndrome which could be responsive to trial of sumatriptan and intranasal 20 mg on demand to use as abortive therapy.  Menaal Russum, DO, [facility]

## 2024-01-05 ENCOUNTER — Telehealth: Payer: Self-pay

## 2024-01-05 NOTE — Telephone Encounter (Signed)
-----   Message from Devin Foerster May sent at 01/04/2024  4:59 PM EDT ----- [name]- Let patient know Dr. Karene Oto reviewed the last visit note and recommended he do a  gastric emptying study as this has not yet been done- this looks at how fast his stomach empties into his small intestine and may help us  further evaluate his nausea. If he is interested we can set it up.  [name], NP ----- Message ----- From: Annis Kinder, DO Sent: 01/04/2024   3:37 PM EDT To: Devin Foerster May, NP     ----- Message ----- From: May, [name], NP Sent: 12/31/2023  11:16 AM EDT To: Annis Kinder, DO

## 2024-01-05 NOTE — Telephone Encounter (Signed)
Patient returning phone call. Requesting a call back. Please advise, thank you.

## 2024-01-06 LAB — VITAMIN D 25 HYDROXY (VIT D DEFICIENCY, FRACTURES): VITD: 16.06 ng/mL — ABNORMAL LOW (ref 30.00–100.00)

## 2024-01-06 LAB — TSH: TSH: 0.78 u[IU]/mL (ref 0.35–5.50)

## 2024-01-07 DIAGNOSIS — M5416 Radiculopathy, lumbar region: Secondary | ICD-10-CM | POA: Diagnosis not present

## 2024-01-10 ENCOUNTER — Ambulatory Visit: Payer: Self-pay | Admitting: Gastroenterology

## 2024-01-20 NOTE — Progress Notes (Signed)
 Subjective:    Patient ID: Henry Figueroa, male    DOB: May 01, 1956, 68 y.o.   MRN: [id number]      HPI Henry Figueroa is here for  Chief Complaint  Patient presents with   Leg Swelling    Right leg swelling and foot   Yesterday morning after breakfast he was getting ready to leave the house and went to put on his shoe and noticed that his right foot and lower leg was swollen.  He denies any pain.  He does have chronic peripheral neuropathy.  Denies any travel in the past month.  He denies any injuries.  He typically wears compression stocks but not daily.  He has no swelling in the left leg.  He has bilateral knee arthritis-the right knee may hurt more than left.  He just recently had steroid injections for his back.   He is taking his medications as prescribed.    Medications and allergies reviewed with patient and updated if appropriate.  Current Outpatient Medications on File Prior to Visit  Medication Sig Dispense Refill   acetaminophen  (TYLENOL ) 650 MG CR tablet Take 1,300 mg by mouth every 8 (eight) hours as needed for pain.     buPROPion  (WELLBUTRIN  XL) 150 MG 24 hr tablet TAKE 1 TABLET BY MOUTH EVERY DAY 90 tablet 0   carvedilol  (COREG ) 6.25 MG tablet TAKE 1 TABLET TWICE DAILY WITH MEALS 180 tablet 3   DULoxetine  (CYMBALTA ) 60 MG capsule TAKE 1 CAPSULE BY MOUTH EVERY DAY 90 capsule 0   olmesartan  (BENICAR ) 20 MG tablet Take 1 tablet (20 mg total) by mouth daily. (Patient taking differently: Take 20 mg by mouth in the morning and at bedtime.) 90 tablet 0   ondansetron  (ZOFRAN -ODT) 4 MG disintegrating tablet Take 1 tablet (4 mg total) by mouth every 8 (eight) hours as needed for nausea or vomiting. 90 tablet 1   pantoprazole  (PROTONIX ) 40 MG tablet Take 1 tablet (40 mg total) by mouth daily. 90 tablet 3   potassium chloride  SA (KLOR-CON  M15) 15 MEQ tablet TAKE 1 TABLET THREE TIMES DAILY 270 tablet 0   rosuvastatin  (CRESTOR ) 10 MG tablet TAKE 1 TABLET EVERY DAY 90 tablet 3    tiZANidine (ZANAFLEX) 4 MG capsule Take 4 mg by mouth 3 (three) times daily. 1/2 tablet once a day.     triamterene -hydrochlorothiazide  (DYAZIDE) 37.5-25 MG capsule Take 1 each (1 capsule total) by mouth daily. 90 capsule 0   No current facility-administered medications on file prior to visit.    Review of Systems  Constitutional:  Negative for fever.  Respiratory:  Negative for cough, shortness of breath and wheezing.   Cardiovascular:  Positive for leg swelling. Negative for chest pain and palpitations.  Musculoskeletal:  Positive for arthralgias.       Objective:   Vitals:   01/21/24 0916  BP: 130/80  Pulse: 100  Temp: 98.3 F (36.8 C)  SpO2: 99%   BP Readings from Last 3 Encounters:  01/21/24 130/80  12/31/23 126/74  11/01/23 (!) 178/88   Wt Readings from Last 3 Encounters:  01/21/24 224 lb (101.6 kg)  12/31/23 208 lb 1 oz (94.4 kg)  11/01/23 234 lb (106.1 kg)   Body mass index is 30.38 kg/m.    Physical Exam Constitutional:      General: He is not in acute distress.    Appearance: Normal appearance. He is not ill-appearing.  HENT:     Head: Normocephalic and atraumatic.   Musculoskeletal:  General: No tenderness (No calf tenderness, no foot tenderness).     Left lower leg: No edema.   Skin:    General: Skin is warm and dry.     Findings: Erythema (Mild erythema right lower extremity that appears to be from venous stasis dermatitis) present. No lesion (No wounds).   Neurological:     Mental Status: He is alert.            Assessment & Plan:    Right leg swelling: Acute Has venous insufficiency and typically has swelling in both legs that is controlled with compression socks, but only right leg is swollen No travel, no injury No pain or obvious wound Need to rule out DVT-stat ultrasound ordered-if positive will see DVT clinic Other possible causes of DVT negative-could be related to right knee osteoarthritis or cellulitis it is not  painful because of peripheral neuropathy Doubt gout-no joint is tender to palpation  Hypertension: Chronic Blood pressure controlled here today Continue carvedilol  6.25 mg twice daily, Benicar  20 mg twice daily, Dyazide 37.5-25 mg daily

## 2024-01-21 ENCOUNTER — Other Ambulatory Visit (HOSPITAL_COMMUNITY): Payer: Self-pay

## 2024-01-21 ENCOUNTER — Telehealth: Payer: Self-pay

## 2024-01-21 ENCOUNTER — Encounter: Payer: Self-pay | Admitting: Student-PharmD

## 2024-01-21 ENCOUNTER — Ambulatory Visit (HOSPITAL_COMMUNITY)
Admission: RE | Admit: 2024-01-21 | Discharge: 2024-01-21 | Disposition: A | Discharge status: Home / Self Care | Source: Ambulatory Visit | Attending: Internal Medicine | Admitting: Internal Medicine

## 2024-01-21 ENCOUNTER — Ambulatory Visit (INDEPENDENT_AMBULATORY_CARE_PROVIDER_SITE_OTHER): Admitting: Internal Medicine

## 2024-01-21 ENCOUNTER — Other Ambulatory Visit (HOSPITAL_COMMUNITY): Payer: Self-pay | Admitting: Internal Medicine

## 2024-01-21 ENCOUNTER — Ambulatory Visit: Admitting: Student-PharmD

## 2024-01-21 ENCOUNTER — Encounter: Payer: Self-pay | Admitting: Internal Medicine

## 2024-01-21 VITALS — BP 130/80 | HR 100 | Temp 98.3°F | Ht 72.0 in | Wt 224.0 lb

## 2024-01-21 VITALS — BP 130/80 | HR 100 | Ht 72.0 in | Wt 224.0 lb

## 2024-01-21 DIAGNOSIS — I10 Essential (primary) hypertension: Secondary | ICD-10-CM

## 2024-01-21 DIAGNOSIS — M7989 Other specified soft tissue disorders: Secondary | ICD-10-CM

## 2024-01-21 DIAGNOSIS — M79604 Pain in right leg: Secondary | ICD-10-CM

## 2024-01-21 DIAGNOSIS — I82421 Acute embolism and thrombosis of right iliac vein: Secondary | ICD-10-CM | POA: Diagnosis not present

## 2024-01-21 DIAGNOSIS — I872 Venous insufficiency (chronic) (peripheral): Secondary | ICD-10-CM

## 2024-01-21 MED ORDER — APIXABAN (ELIQUIS) VTE STARTER PACK (10MG AND 5MG)
ORAL_TABLET | ORAL | 0 refills | Status: DC
  Filled 2024-01-21: qty 74, 30d supply, fill #0

## 2024-01-21 MED ORDER — APIXABAN 5 MG PO TABS: 5.0000 mg | ORAL_TABLET | Freq: Two times a day (BID) | ORAL | 1 refills | Status: DC

## 2024-01-21 NOTE — Telephone Encounter (Signed)
 Patient Advocate Encounter  Test billing for this patients current coverage returns copays of $47 for 30 days, $141 for 90 days of Eliquis.  Rachel DEL, CPhT Rx Patient Advocate Phone: [phone]
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
## 2024-01-21 NOTE — Patient Instructions (Addendum)
     An US  of your right leg was ordered.      Medications changes include :   None      Return if symptoms worsen or fail to improve.
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
## 2024-01-21 NOTE — Patient Instructions (Signed)
-  Start apixaban (Eliquis) 10 mg twice daily for 7 days followed by 5 mg twice daily. -Your refills have been sent to your [facility]. You may need to call the pharmacy to ask them to fill this when you start to run low on your current supply.  -It is important to take your medication around the same time every day.  -Avoid NSAIDs like ibuprofen (Advil, Motrin) and naproxen (Aleve) as well as aspirin doses over 100 mg daily. -Tylenol  (acetaminophen ) is the preferred over the counter pain medication to lower the risk of bleeding. -Be sure to alert all of your health care providers that you are taking an anticoagulant prior to starting a new medication or having a procedure. -Monitor for signs and symptoms of bleeding (abnormal bruising, prolonged bleeding, nose bleeds, bleeding from gums, discolored urine, black tarry stools). If you have fallen and hit your head OR if your bleeding is severe or not stopping, seek emergency care.  -Go to the emergency room if emergent signs and symptoms of new clot occur (new or worse swelling and pain in an arm or leg, shortness of breath, chest pain, fast or irregular heartbeats, lightheadedness, dizziness, fainting, coughing up blood) or if you experience a significant color change (pale or blue) in the extremity that has the DVT.  -We recommend you wear compression stockings (20-30 mmHg) as long as you are having swelling or pain. Be sure to purchase the correct size and take them off at night.   If you have any questions or need to reschedule an appointment, please call [phone]. If you are having an emergency, call 911 or present to the nearest emergency room.   What is a DVT?  -Deep vein thrombosis (DVT) is a condition in which a blood clot forms in a vein of the deep venous system which can occur in the lower leg, thigh, pelvis, arm, or neck. This condition is serious and can be life-threatening if the clot travels to the arteries of the lungs and  causing a blockage (pulmonary embolism, PE). A DVT can also damage veins in the leg, which can lead to long-term venous disease, leg pain, swelling, discoloration, and ulcers or sores (post-thrombotic syndrome).  -Treatment may include taking an anticoagulant medication to prevent more clots from forming and the current clot from growing, wearing compression stockings, and/or surgical procedures to remove or dissolve the clot.

## 2024-01-24 ENCOUNTER — Other Ambulatory Visit: Payer: Self-pay

## 2024-01-24 ENCOUNTER — Ambulatory Visit (HOSPITAL_COMMUNITY)
Admission: RE | Admit: 2024-01-24 | Discharge: 2024-01-24 | Disposition: A | Discharge status: Home / Self Care | Attending: Vascular Surgery | Admitting: Vascular Surgery

## 2024-01-24 ENCOUNTER — Encounter (HOSPITAL_COMMUNITY)
Admission: RE | Disposition: A | Discharge status: Home / Self Care | Payer: Self-pay | Source: Home / Self Care | Attending: Vascular Surgery

## 2024-01-24 DIAGNOSIS — I871 Compression of vein: Secondary | ICD-10-CM | POA: Diagnosis not present

## 2024-01-24 DIAGNOSIS — I4891 Unspecified atrial fibrillation: Secondary | ICD-10-CM | POA: Insufficient documentation

## 2024-01-24 DIAGNOSIS — I82531 Chronic embolism and thrombosis of right popliteal vein: Secondary | ICD-10-CM

## 2024-01-24 DIAGNOSIS — I82421 Acute embolism and thrombosis of right iliac vein: Secondary | ICD-10-CM | POA: Diagnosis not present

## 2024-01-24 DIAGNOSIS — I872 Venous insufficiency (chronic) (peripheral): Secondary | ICD-10-CM | POA: Insufficient documentation

## 2024-01-24 DIAGNOSIS — G4733 Obstructive sleep apnea (adult) (pediatric): Secondary | ICD-10-CM | POA: Insufficient documentation

## 2024-01-24 DIAGNOSIS — I82521 Chronic embolism and thrombosis of right iliac vein: Secondary | ICD-10-CM | POA: Diagnosis not present

## 2024-01-24 DIAGNOSIS — I82431 Acute embolism and thrombosis of right popliteal vein: Secondary | ICD-10-CM | POA: Diagnosis not present

## 2024-01-24 DIAGNOSIS — Z87891 Personal history of nicotine dependence: Secondary | ICD-10-CM | POA: Diagnosis not present

## 2024-01-24 DIAGNOSIS — Z79899 Other long term (current) drug therapy: Secondary | ICD-10-CM | POA: Insufficient documentation

## 2024-01-24 DIAGNOSIS — N400 Enlarged prostate without lower urinary tract symptoms: Secondary | ICD-10-CM | POA: Insufficient documentation

## 2024-01-24 DIAGNOSIS — I719 Aortic aneurysm of unspecified site, without rupture: Secondary | ICD-10-CM | POA: Diagnosis not present

## 2024-01-24 DIAGNOSIS — E785 Hyperlipidemia, unspecified: Secondary | ICD-10-CM | POA: Insufficient documentation

## 2024-01-24 DIAGNOSIS — I10 Essential (primary) hypertension: Secondary | ICD-10-CM | POA: Insufficient documentation

## 2024-01-24 DIAGNOSIS — Z7901 Long term (current) use of anticoagulants: Secondary | ICD-10-CM | POA: Insufficient documentation

## 2024-01-24 DIAGNOSIS — K219 Gastro-esophageal reflux disease without esophagitis: Secondary | ICD-10-CM | POA: Insufficient documentation

## 2024-01-24 DIAGNOSIS — I82511 Chronic embolism and thrombosis of right femoral vein: Secondary | ICD-10-CM | POA: Diagnosis not present

## 2024-01-24 HISTORY — PX: LOWER EXTREMITY INTERVENTION: CATH118252

## 2024-01-24 HISTORY — PX: PERIPHERAL VASCULAR THROMBECTOMY: CATH118306

## 2024-01-24 LAB — POCT I-STAT, CHEM 8
BUN: 20 mg/dL (ref 8–23)
Calcium, Ion: 1.18 mmol/L (ref 1.15–1.40)
Chloride: 100 mmol/L (ref 98–111)
Creatinine, Ser: 1.1 mg/dL (ref 0.61–1.24)
Glucose, Bld: 97 mg/dL (ref 70–99)
HCT: 37 % — ABNORMAL LOW (ref 39.0–52.0)
Hemoglobin: 12.6 g/dL — ABNORMAL LOW (ref 13.0–17.0)
Potassium: 4.2 mmol/L (ref 3.5–5.1)
Sodium: 138 mmol/L (ref 135–145)
TCO2: 25 mmol/L (ref 22–32)

## 2024-01-24 SURGERY — PERIPHERAL VASCULAR THROMBECTOMY
Anesthesia: LOCAL | Laterality: Right

## 2024-01-24 MED ORDER — SODIUM CHLORIDE 0.9% FLUSH: 3.0000 mL | Freq: Two times a day (BID) | INTRAVENOUS | Status: DC

## 2024-01-24 MED ORDER — SODIUM CHLORIDE 0.9% FLUSH: 3.0000 mL | INTRAVENOUS | Status: DC | PRN

## 2024-01-24 MED ORDER — SODIUM CHLORIDE 0.9 % WEIGHT BASED INFUSION: 1.0000 mL/kg/h | INTRAVENOUS | Status: DC

## 2024-01-24 MED ORDER — LABETALOL HCL 5 MG/ML IV SOLN: 10.0000 mg | INTRAVENOUS | Status: DC | PRN

## 2024-01-24 MED ORDER — LIDOCAINE HCL (PF) 1 % IJ SOLN
INTRAMUSCULAR | Status: DC | PRN
  Administered 2024-01-24: 10 mL

## 2024-01-24 MED ORDER — ACETAMINOPHEN 325 MG PO TABS: 650.0000 mg | ORAL_TABLET | ORAL | Status: DC | PRN

## 2024-01-24 MED ORDER — SODIUM CHLORIDE 0.9 % IV SOLN: INTRAVENOUS | Status: DC

## 2024-01-24 MED ORDER — IODIXANOL 320 MG/ML IV SOLN
INTRAVENOUS | Status: DC | PRN
  Administered 2024-01-24: 50 mL

## 2024-01-24 MED ORDER — HYDRALAZINE HCL 20 MG/ML IJ SOLN: 5.0000 mg | INTRAMUSCULAR | Status: DC | PRN

## 2024-01-24 MED ORDER — LIDOCAINE HCL (PF) 1 % IJ SOLN
INTRAMUSCULAR | Status: AC
  Filled 2024-01-24: qty 30

## 2024-01-24 MED ORDER — HEPARIN (PORCINE) IN NACL 1000-0.9 UT/500ML-% IV SOLN
INTRAVENOUS | Status: DC | PRN
  Administered 2024-01-24: 500 mL

## 2024-01-24 MED ORDER — SODIUM CHLORIDE 0.9 % IV SOLN: 250.0000 mL | INTRAVENOUS | Status: DC | PRN

## 2024-01-24 MED ORDER — ONDANSETRON HCL 4 MG/2ML IJ SOLN: 4.0000 mg | Freq: Four times a day (QID) | INTRAMUSCULAR | Status: DC | PRN

## 2024-01-24 SURGICAL SUPPLY — 13 items
BALLOON MUSTANG 10X60X135 (BALLOONS) IMPLANT
CANISTER PENUMBRA ENGINE (MISCELLANEOUS) IMPLANT
CATH LIGHTNI FLASH 16XTORQ 100 (CATHETERS) IMPLANT
CATH VISIONS PV .035 IVUS (CATHETERS) IMPLANT
COVER DOME SNAP 22 D (MISCELLANEOUS) IMPLANT
GLIDEWIRE ADV .035X260CM (WIRE) IMPLANT
KIT ENCORE 26 ADVANTAGE (KITS) IMPLANT
KIT MICROPUNCTURE NIT STIFF (SHEATH) IMPLANT
SHEATH DRYSEAL FLEX 16FR 33CM (SHEATH) IMPLANT
SHEATH PINNACLE 8F 10CM (SHEATH) IMPLANT
SHEATH PROBE COVER 6X72 (BAG) IMPLANT
TAPE UMBILICAL 1/8X30 (MISCELLANEOUS) IMPLANT
TRAY PV CATH (CUSTOM PROCEDURE TRAY) IMPLANT

## 2024-01-24 NOTE — Discharge Instructions (Signed)
 Venous popliteal Site Care This sheet gives you information about how to care for yourself after your procedure. Your health care provider may also give you more specific instructions. If you have problems or questions, contact your health care provider. What can I expect after the procedure? After the procedure, it is common to have: Bruising that usually fades within 1-2 weeks. Tenderness at the site. Follow these instructions at home: Wound care Follow instructions from your health care provider about how to take care of your insertion site. Make sure you: Wash your hands with soap and water before you change your bandage (dressing). If soap and water are not available, use hand sanitizer. Remove your dressing as told by your health care provider. In 24 hours Do not take baths, swim, or use a hot tub for 5 days You may shower 24 hours after the procedure or as told by your health care provider. Gently wash the site with plain soap and water. Pat the area dry with a clean towel. Do not rub the site. This may cause bleeding. Do not apply powder or lotion to the site. Keep the site clean and dry. Check your site every day for signs of infection. Check for: Redness, swelling, or pain. Fluid or blood. Warmth. Pus or a bad smell. Activity For the first 2-3 days after your procedure, or as long as directed: Avoid climbing stairs as much as possible. Do not squat. Do not lift anything that is heavier than 10 lb (4.5 kg), or the limit that you are told, until your health care provider says that it is safe. For 5 days Rest as directed. Avoid sitting for a long time without moving. Get up to take short walks every 1-2 hours. Do not drive for 24 hours General instructions Take over-the-counter and prescription medicines only as told by your health care provider. Keep all follow-up visits as told by your health care provider. This is important. Contact a health care provider if you have: A  fever or chills. You have redness, swelling, or pain around your insertion site. Get help right away if: The catheter insertion area swells very fast. You pass out. You suddenly start to sweat or your skin gets clammy. The catheter insertion area is bleeding, and the bleeding does not stop when you hold steady pressure on the area. These symptoms may represent a serious problem that is an emergency. Do not wait to see if the symptoms will go away. Get medical help right away. Call your local emergency services (911 in the [place] Do not drive yourself to the hospital. Summary After the procedure, it is common to have bruising that usually fades within 1-2 weeks. Check your femoral site every day for signs of infection. Do not lift anything that is heavier than 10 lb (4.5 kg), or the limit that you are told, until your health care provider says that it is safe. This information is not intended to replace advice given to you by your health care provider. Make sure you discuss any questions you have with your health care provider. Document Revised: 07/26/2017 Document Reviewed: 07/26/2017 Elsevier Patient Education  2020 [employer].

## 2024-01-24 NOTE — Interval H&P Note (Signed)
 History and Physical Interval Note:  01/24/2024 9:17 AM  Henry Figueroa  has presented today for surgery, with the diagnosis of dvt.  The various methods of treatment have been discussed with the patient and family. After consideration of risks, benefits and other options for treatment, the patient has consented to  Procedure(s): PERIPHERAL VASCULAR THROMBECTOMY (N/A) as a surgical intervention.  The patient's history has been reviewed, patient examined, no change in status, stable for surgery.  I have reviewed the patient's chart and labs.  Questions were answered to the patient's satisfaction.     Norman GORMAN Serve

## 2024-01-24 NOTE — Op Note (Signed)
    Patient name: Henry Figueroa MRN: [id number] DOB: [birth year] Sex: male  01/24/2024 Pre-operative Diagnosis: Acute right lower extremity DVT Post-operative diagnosis:  Same Surgeon:  Norman GORMAN Serve, MD Procedure Performed:  Ultrasound-guided access of right popliteal vein Right lower extremity venogram and iliocavogram Intravascular ultrasound of IVC, common iliac, external iliac, common femoral vein, femoral vein and popliteal vein Mechanical thrombectomy of external iliac vein, common femoral vein, femoral vein and popliteal vein with 16 French penumbra Balloon angioplasty of right common femoral vein with 10 mm x 60 mm Mustang   Indications: Henry Figueroa is a 68 year old male who presented to the DVT clinic last week with right lower extremity swelling.  He was found to have a right lower extremity DVT with external iliac extension.  He was put on Eliquis and risks and benefits of thrombectomy to minimize PTS syndrome were reviewed, he expressed understanding and elected to proceed.  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   Procedure:  The patient was identified in the holding area and taken to the cath lab  The patient was then placed supine on the table and prepped and draped in the usual sterile fashion.  A time out was called.  Ultrasound was used to evaluate the right popliteal vein, this was semicompressible.  The popliteal region was then anesthetized with lidocaine  and the right popliteal vein was accessed under ultrasound guidance.  The 018 wire was placed into the vein through the needle and micropuncture sheath was placed over this wire.  The inner dilator was removed and an 018 glide advantage wire was easily passed up into the IVC.  The access was then upsized to 8 [place] and intravascular ultrasound was performed and all veins from the IVC down through the  right popliteal vein were evaluated.  The IVUS catheter was removed and the 8 French sheath was removed and access was upsized to a 16 [place] dry seal sheath.  The 16 French penumbra was then used and 2 passes over the wire were made from the common iliac vein down to the popliteal vein.  I then made 1 pass without the wire in order to remove as much acute thrombus as possible.  The wire was then replaced and easily passed into the IVC.  IVUS ultrasound was used to reevaluate the vessel which demonstrated clearing of acute thrombus but there was still chronic thrombus and stenosis in the right common femoral artery.  A venogram demonstrated a significant stenosis in this area and therefore a 10 mm x 60 mm Mustang balloon was used to treat this stenosis.  Completion angiogram demonstrated brisk flow from the right leg into the iliac system and IVC and therefore the procedure was concluded at this time.  The wire and sheath were removed and a 4-0 Monocryl figure-of-eight suture was placed around the access for hemostasis.  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  All acute components of the thrombus were removed and the right common femoral vein stenosis was treated with a 10 mm Mustang balloon.   Norman GORMAN Serve MD [facility] Office: [phone]

## 2024-01-25 ENCOUNTER — Encounter (HOSPITAL_COMMUNITY): Payer: Self-pay | Admitting: Vascular Surgery

## 2024-02-06 ENCOUNTER — Other Ambulatory Visit: Payer: Self-pay | Admitting: Internal Medicine

## 2024-02-06 DIAGNOSIS — F322 Major depressive disorder, single episode, severe without psychotic features: Secondary | ICD-10-CM

## 2024-02-13 ENCOUNTER — Other Ambulatory Visit: Payer: Self-pay | Admitting: Internal Medicine

## 2024-02-13 DIAGNOSIS — F322 Major depressive disorder, single episode, severe without psychotic features: Secondary | ICD-10-CM

## 2024-02-14 DIAGNOSIS — M47816 Spondylosis without myelopathy or radiculopathy, lumbar region: Secondary | ICD-10-CM | POA: Diagnosis not present

## 2024-02-23 ENCOUNTER — Other Ambulatory Visit: Payer: Self-pay

## 2024-02-23 ENCOUNTER — Other Ambulatory Visit: Payer: Self-pay | Admitting: Gastroenterology

## 2024-02-23 DIAGNOSIS — R112 Nausea with vomiting, unspecified: Secondary | ICD-10-CM

## 2024-03-02 ENCOUNTER — Other Ambulatory Visit: Payer: Self-pay | Admitting: Internal Medicine

## 2024-03-02 ENCOUNTER — Inpatient Hospital Stay: Attending: Hematology and Oncology | Admitting: Hematology and Oncology

## 2024-03-02 ENCOUNTER — Inpatient Hospital Stay

## 2024-03-02 VITALS — BP 151/101 | HR 107 | Temp 97.2°F | Resp 16 | Wt 224.7 lb

## 2024-03-02 DIAGNOSIS — F1021 Alcohol dependence, in remission: Secondary | ICD-10-CM | POA: Insufficient documentation

## 2024-03-02 DIAGNOSIS — Z87891 Personal history of nicotine dependence: Secondary | ICD-10-CM | POA: Diagnosis not present

## 2024-03-02 DIAGNOSIS — I82411 Acute embolism and thrombosis of right femoral vein: Secondary | ICD-10-CM | POA: Insufficient documentation

## 2024-03-02 DIAGNOSIS — Z79899 Other long term (current) drug therapy: Secondary | ICD-10-CM | POA: Insufficient documentation

## 2024-03-02 DIAGNOSIS — I2699 Other pulmonary embolism without acute cor pulmonale: Secondary | ICD-10-CM | POA: Diagnosis not present

## 2024-03-02 DIAGNOSIS — I4891 Unspecified atrial fibrillation: Secondary | ICD-10-CM | POA: Insufficient documentation

## 2024-03-02 DIAGNOSIS — I82402 Acute embolism and thrombosis of unspecified deep veins of left lower extremity: Secondary | ICD-10-CM

## 2024-03-02 DIAGNOSIS — Z7901 Long term (current) use of anticoagulants: Secondary | ICD-10-CM | POA: Diagnosis not present

## 2024-03-02 DIAGNOSIS — I10 Essential (primary) hypertension: Secondary | ICD-10-CM | POA: Insufficient documentation

## 2024-03-02 LAB — CMP (CANCER CENTER ONLY)
ALT: 19 U/L (ref 0–44)
AST: 22 U/L (ref 15–41)
Albumin: 4.9 g/dL (ref 3.5–5.0)
Alkaline Phosphatase: 53 U/L (ref 38–126)
Anion gap: 9 (ref 5–15)
BUN: 31 mg/dL — ABNORMAL HIGH (ref 8–23)
CO2: 33 mmol/L — ABNORMAL HIGH (ref 22–32)
Calcium: 10.5 mg/dL — ABNORMAL HIGH (ref 8.9–10.3)
Chloride: 98 mmol/L (ref 98–111)
Creatinine: 1.53 mg/dL — ABNORMAL HIGH (ref 0.61–1.24)
GFR, Estimated: 49 mL/min — ABNORMAL LOW (ref >60)
Glucose, Bld: 104 mg/dL — ABNORMAL HIGH (ref 70–99)
Potassium: 4.2 mmol/L (ref 3.5–5.1)
Sodium: 140 mmol/L (ref 135–145)
Total Bilirubin: 0.4 mg/dL (ref 0.0–1.2)
Total Protein: 8.1 g/dL (ref 6.5–8.1)

## 2024-03-02 LAB — CBC WITH DIFFERENTIAL (CANCER CENTER ONLY)
Abs Immature Granulocytes: 0.05 K/uL (ref 0.00–0.07)
Basophils Absolute: 0.1 K/uL (ref 0.0–0.1)
Basophils Relative: 1 %
Eosinophils Absolute: 0.1 K/uL (ref 0.0–0.5)
Eosinophils Relative: 1 %
HCT: 44.7 % (ref 39.0–52.0)
Hemoglobin: 15.6 g/dL (ref 13.0–17.0)
Immature Granulocytes: 1 %
Lymphocytes Relative: 34 %
Lymphs Abs: 2.3 K/uL (ref 0.7–4.0)
MCH: 34.5 pg — ABNORMAL HIGH (ref 26.0–34.0)
MCHC: 34.9 g/dL (ref 30.0–36.0)
MCV: 98.9 fL (ref 80.0–100.0)
Monocytes Absolute: 0.6 K/uL (ref 0.1–1.0)
Monocytes Relative: 9 %
Neutro Abs: 3.7 K/uL (ref 1.7–7.7)
Neutrophils Relative %: 54 %
Platelet Count: 297 K/uL (ref 150–400)
RBC: 4.52 MIL/uL (ref 4.22–5.81)
RDW: 11.5 % (ref 11.5–15.5)
WBC Count: 6.7 K/uL (ref 4.0–10.5)
nRBC: 0 % (ref 0.0–0.2)
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
## 2024-03-03 LAB — CARDIOLIPIN ANTIBODIES, IGG, IGM, IGA
Anticardiolipin IgA: <9 U/mL (ref 0–11)
Anticardiolipin IgG: <9 GPL U/mL (ref 0–14)
Anticardiolipin IgM: <9 [MPL'U]/mL (ref 0–12)

## 2024-03-04 LAB — BETA-2-GLYCOPROTEIN I ABS, IGG/M/A
Beta-2 Glyco I IgG: <9 GPI IgG units (ref 0–20)
Beta-2-Glycoprotein I IgA: <9 GPI IgA units (ref 0–25)
Beta-2-Glycoprotein I IgM: <9 GPI IgM units (ref 0–32)

## 2024-03-05 ENCOUNTER — Other Ambulatory Visit: Payer: Self-pay | Admitting: Internal Medicine

## 2024-03-05 DIAGNOSIS — I10 Essential (primary) hypertension: Secondary | ICD-10-CM

## 2024-03-05 DIAGNOSIS — E876 Hypokalemia: Secondary | ICD-10-CM

## 2024-03-06 ENCOUNTER — Emergency Department (HOSPITAL_COMMUNITY)

## 2024-03-06 ENCOUNTER — Other Ambulatory Visit: Payer: Self-pay

## 2024-03-06 ENCOUNTER — Encounter (HOSPITAL_COMMUNITY): Payer: Self-pay

## 2024-03-06 ENCOUNTER — Emergency Department (HOSPITAL_COMMUNITY)
Admission: EM | Admit: 2024-03-06 | Discharge: 2024-03-06 | Disposition: A | Discharge status: Home / Self Care | Attending: Emergency Medicine | Admitting: Emergency Medicine

## 2024-03-06 DIAGNOSIS — M5124 Other intervertebral disc displacement, thoracic region: Secondary | ICD-10-CM | POA: Diagnosis not present

## 2024-03-06 DIAGNOSIS — Z7901 Long term (current) use of anticoagulants: Secondary | ICD-10-CM | POA: Diagnosis not present

## 2024-03-06 DIAGNOSIS — G8929 Other chronic pain: Secondary | ICD-10-CM | POA: Diagnosis not present

## 2024-03-06 DIAGNOSIS — S32018A Other fracture of first lumbar vertebra, initial encounter for closed fracture: Secondary | ICD-10-CM | POA: Insufficient documentation

## 2024-03-06 DIAGNOSIS — M47817 Spondylosis without myelopathy or radiculopathy, lumbosacral region: Secondary | ICD-10-CM | POA: Diagnosis not present

## 2024-03-06 DIAGNOSIS — W1839XA Other fall on same level, initial encounter: Secondary | ICD-10-CM | POA: Diagnosis not present

## 2024-03-06 DIAGNOSIS — Y92014 Private driveway to single-family (private) house as the place of occurrence of the external cause: Secondary | ICD-10-CM | POA: Diagnosis not present

## 2024-03-06 DIAGNOSIS — M47814 Spondylosis without myelopathy or radiculopathy, thoracic region: Secondary | ICD-10-CM | POA: Diagnosis not present

## 2024-03-06 DIAGNOSIS — W19XXXA Unspecified fall, initial encounter: Secondary | ICD-10-CM | POA: Diagnosis not present

## 2024-03-06 DIAGNOSIS — M545 Low back pain, unspecified: Secondary | ICD-10-CM | POA: Diagnosis not present

## 2024-03-06 DIAGNOSIS — M5127 Other intervertebral disc displacement, lumbosacral region: Secondary | ICD-10-CM | POA: Diagnosis not present

## 2024-03-06 DIAGNOSIS — R262 Difficulty in walking, not elsewhere classified: Secondary | ICD-10-CM | POA: Diagnosis not present

## 2024-03-06 DIAGNOSIS — S3992XA Unspecified injury of lower back, initial encounter: Secondary | ICD-10-CM | POA: Diagnosis present

## 2024-03-06 DIAGNOSIS — S32010A Wedge compression fracture of first lumbar vertebra, initial encounter for closed fracture: Secondary | ICD-10-CM

## 2024-03-06 DIAGNOSIS — M4856XA Collapsed vertebra, not elsewhere classified, lumbar region, initial encounter for fracture: Secondary | ICD-10-CM | POA: Diagnosis not present

## 2024-03-06 DIAGNOSIS — S22089A Unspecified fracture of T11-T12 vertebra, initial encounter for closed fracture: Secondary | ICD-10-CM | POA: Diagnosis not present

## 2024-03-06 MED ORDER — TIZANIDINE HCL 4 MG PO TABS: 4.0000 mg | ORAL_TABLET | Freq: Four times a day (QID) | ORAL | 0 refills | Status: AC | PRN

## 2024-03-06 MED ORDER — OXYCODONE-ACETAMINOPHEN 5-325 MG PO TABS
1.0000 | ORAL_TABLET | Freq: Once | ORAL | Status: AC
  Administered 2024-03-06 (×2): 1 via ORAL
  Filled 2024-03-06: qty 1

## 2024-03-06 NOTE — Discharge Instructions (Addendum)
 Follow-up with Dr. Ibazebo. Continue to wear your lumbosacral corset brace daily, you may take this off to shower. Continue to ambulate with the use of a walker as needed. Continue Tylenol  as needed for pain. Start tizanidine , 1 tablet by mouth every 6 hours as needed for muscle spasms. Follow-up with your provider, Dr. Ibazebo, to discuss further management of your condition. Return to the emergency department if you develop worsening back pain, numbness/tingling/weakness of your lower extremities, or if you experience bowel/bladder incontinence.

## 2024-03-06 NOTE — ED Notes (Signed)
 Pt wa able to ambulate to the bathroom and back with no assistance. Pt stated no issues breathing and he wasn't tired. Pt stated my back is still a little sore but I way better than I did earlier

## 2024-03-06 NOTE — ED Triage Notes (Signed)
 Pt BIB EMS from home with reports of chronic lower back pain with numbness and tingling to his lower extremities. Pt fell on Saturday and has been unable to walk today. Pt has pain with movement.

## 2024-03-06 NOTE — ED Provider Notes (Signed)
 [facility] AT [facility] Provider Note   CSN: [id number] Arrival date & time: 03/06/24  1333     Patient presents with: Back Pain   Henry Figueroa is a 68 y.o. male.   68 year old male presenting with back pain.  Patient was visiting his mother at her home on Sunday when he fell in the driveway, stating that most of the impact was to my mid back, denies head injury/loss of consciousness.  Patient said he was in his normal state of health yesterday but when he woke up this morning noticed worsening numbness in both feet as well as 8/10 back pain, states that he does have neuropathy but that this was different, describes numbness from his lower shin down and is equal bilaterally.  He states that he was unable to walk this morning secondary to this numbness, thus he called EMS to bring him in today.  Denies bowel/bladder incontinence, fever, IV substance use, numbness/weakness of upper extremities. 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   Back Pain      Prior to Admission medications   Medication Sig Start Date End Date Taking? Authorizing Provider  acetaminophen  (TYLENOL ) 650 MG CR tablet Take 1,300 mg by mouth every 8 (eight) hours as needed for pain.    [provider]  apixaban  (ELIQUIS ) 5 MG TABS tablet Take 1 tablet (5 mg total) by mouth 2 (two) times daily. Start taking after completion of starter pack. 01/21/24   Barbarann Dixon B, RPH-CPP  buPROPion  (WELLBUTRIN  XL) 150 MG 24 hr tablet TAKE 1 TABLET BY MOUTH EVERY DAY 02/15/24   Joshua Debby CROME, MD  carvedilol  (COREG ) 6.25 MG tablet TAKE 1 TABLET TWICE DAILY WITH MEALS 12/07/23   Joshua Debby CROME, MD  DULoxetine  (CYMBALTA ) 60 MG capsule TAKE 1 CAPSULE BY MOUTH EVERY DAY 02/15/24   Joshua Debby CROME, MD  olmesartan  (BENICAR ) 20 MG tablet Take 1 tablet (20 mg total) by mouth daily. 04/10/23   Joshua Debby CROME, MD  ondansetron  (ZOFRAN -ODT) 4 MG disintegrating tablet Take 1 tablet (4 mg  total) by mouth every 8 (eight) hours as needed for nausea or vomiting. Patient taking differently: Take 4 mg by mouth daily. 12/31/23   May, [name], NP  pantoprazole  (PROTONIX ) 40 MG tablet Take 1 tablet (40 mg total) by mouth daily. 11/01/23   Cirigliano, Vito V, DO  potassium chloride  SA (KLOR-CON  M15) 15 MEQ tablet TAKE 1 TABLET THREE TIMES DAILY 09/03/23   Joshua Debby CROME, MD  rosuvastatin  (CRESTOR ) 10 MG tablet TAKE 1 TABLET EVERY DAY 12/07/23   Joshua Debby CROME, MD  tiZANidine  (ZANAFLEX ) 4 MG capsule Take 4 mg by mouth daily.    [provider]  triamterene -hydrochlorothiazide  (DYAZIDE) 37.5-25 MG capsule Take 1 each (1 capsule total) by mouth daily. 04/10/23   Joshua Debby CROME, MD    Allergies: Patient has no known allergies.    Review of Systems  Musculoskeletal:  Positive for back pain.    Updated Vital Signs  Vitals:   03/06/24 1343 03/06/24 1700 03/06/24 2034  BP: (!) 160/99 (!) 145/102 (!) 151/107  Pulse: 96 (!) 107 (!) 111  Resp: 18 18 18   Temp: 98.3 F (36.8 C) 98.8 F (37.1 C) 98.6 F (37 C)  TempSrc: Oral Oral Oral  SpO2: 98% 98% 95%     Physical Exam Vitals and nursing note reviewed.  HENT:     Head: Normocephalic.  Eyes:     Extraocular  Movements: Extraocular movements intact.  Cardiovascular:     Rate and Rhythm: Normal rate.  Pulmonary:     Effort: Pulmonary effort is normal.  Musculoskeletal:     Cervical back: Normal range of motion.     Comments: Back: Full ROM. TTP of lower throacic / upper lumbar spine, no palpable step-off or deformity, no bruising.  No paraspinal muscle tenderness to palpation. 5/5 strength against resistance of bilateral lower extremities  Skin:    General: Skin is warm and dry.  Neurological:     Mental Status: He is alert and oriented to person, place, and time.     Sensory: Sensory deficit (numbness bilaterally from distal shin down to feet) present.     Motor: No weakness.     (all labs ordered are listed, but  only abnormal results are displayed) Labs Reviewed - No data to display  EKG: None  Radiology: CT Lumbar Spine Wo Contrast Result Date: 03/06/2024 EXAM: CT OF THE LUMBAR SPINE WITHOUT CONTRAST 03/06/2024 05:49:18 PM TECHNIQUE: CT of the lumbar spine was performed without the administration of intravenous contrast. Multiplanar reformatted images are provided for review. Automated exposure control, iterative reconstruction, and/or weight based adjustment of the mA/kV was utilized to reduce the radiation dose to as low as reasonably achievable. COMPARISON: MRI lumbar spine 11/28/2023 CLINICAL HISTORY: Fall, back pain. Per triage notes: Pt BIB EMS from home with reports of chronic lower back pain with numbness and tingling to his lower extremities. Pt fell on Saturday and has been unable to walk today. Pt has pain with movement. FINDINGS: BONES AND ALIGNMENT: There is a new compression fracture of L1 with fracture line extending through the anterior cortex and superior endplate irregularity of the superior endplate with approximately 15% height loss anteriorly. There is minimal retropulsion at the superior endplate. Chronic irregularity of the L2 superior endplate. Degenerative endplate changes with sclerosis and cyst formation along the right aspect of L3-4. Vertebral body heights are otherwise maintained. 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 DEGENERATIVE CHANGES: Small disc bulges at multiple levels. There is moderate disc space narrowing at L3-4. Disc bulge, facet arthrosis, and thickening of the ligaments and plenum at L3-4 resulting in mild spinal canal stenosis. Additional disc bulge, facet arthrosis, and thickening of the ligamentum flavum at L4-5 resulting in mild spinal canal stenosis. Left paracentral disc protrusion at L5-S1 resulting  in lateral recess narrowing with possible impingement upon the traversing left S1 nerve root. There is severe facet arthrosis at L5-S1. Moderate foraminal stenosis on the right at L3-4. Mild foraminal stenosis on the left at L4-5. There is mild bilateral foraminal stenosis at L5-S1. SOFT TISSUES: The paraspinal musculature is unremarkable. There is mild-to-moderate atherosclerosis of the abdominal aorta and branch vessels. IMPRESSION: 1. New compression fracture of L1 with approximately 15% anterior height loss and minimal retropulsion. Consider MRI for further evaluation. 2. Left paracentral disc protrusion at L5-S1 resulting in lateral recess narrowing with possible impingement upon the traversing left S1 nerve root. 3. Mild spinal canal stenosis at L3-4 and L4-5. 4. Severe facet arthrosis at L5-S1. Electronically signed by: Donnice Mania MD 03/06/2024 06:57 PM EDT RP Workstation: [id number]   CT Thoracic Spine Wo Contrast Result Date: 03/06/2024 EXAM: CT THORACIC SPINE WITHOUT CONTRAST 03/06/2024 05:49:18 PM TECHNIQUE: CT of the thoracic spine was performed without the administration of intravenous  contrast. Multiplanar reformatted images are provided for review. Automated exposure control, iterative reconstruction, and/or weight based adjustment of the mA/kV was utilized to reduce the radiation dose to as low as reasonably achievable. COMPARISON: CTA chest 03/06/2024 CLINICAL HISTORY: Fall, back pain. Per triage notes: Pt BIB EMS from home with reports of chronic lower back pain with numbness and tingling to his lower extremities. Pt fell on Saturday and has been unable to walk today. Pt has pain with movement. FINDINGS: BONES AND ALIGNMENT: There is mild straightening of the normal thoracic kyphosis. No listhesis. Normal facet alignment. There is a nondisplaced fracture of the T12 spinous process. No compression fracture or displaced fracture in the thoracic spine. DEGENERATIVE CHANGES: Degenerative  endplate osteophytes at multiple levels. Schmorl's nodes at multiple levels. Arthrosis at multiple levels. There is no CT evidence of large disc herniation or high-grade osseospinal canal stenosis. There is a left paracentral disc protrusion at T9-10 which indents the ventral thecal sac without high-grade stenosis. There is no high-grade osseous foraminal stenosis in the thoracic spine. SOFT TISSUES: The paraspinal musculature is unremarkable. VASCULATURE: There is mild atherosclerosis of the visualized thoracic aorta. IMPRESSION: 1. Nondisplaced fracture of the T12 spinous process. No compression fracture or displaced fracture in the thoracic spine. 2. Left paracentral disc protrusion at T9-10, indenting the ventral thecal sac without high-grade stenosis. Electronically signed by: Donnice Mania MD 03/06/2024 06:48 PM EDT RP Workstation: [id number]     Procedures   Medications Ordered in the ED  oxyCODONE -acetaminophen  (PERCOCET/ROXICET) 5-325 MG per tablet 1 tablet (has no administration in time range)                                    Medical Decision Making This patient presents to the ED for concern of back pain, this involves an extensive number of treatment options, and is a complaint that carries with it a high risk of complications and morbidity.  The differential diagnosis includes fracture, contusion, spinal cord compression, disc herniation   Co morbidities that complicate the patient evaluation  Neuropathy, DVT on Eliquis    Additional history obtained:  Additional history obtained from record review External records from outside source obtained and reviewed including recent outpatient notes  Consults: - I spoke to the neurosurgeon, Dr. Mavis, who reviewed the patient's imaging and feels that he is appropriate for outpatient follow-up as he is already established with Dr. Ibazebo, he recommends lumbosacral corset and pain control with outpatient follow-up.   Imaging  Studies ordered:  I ordered imaging studies including CT lumbar/throacic  I independently visualized and interpreted imaging which showed  - CT thoracic: 1. Nondisplaced fracture of the T12 spinous process. No compression fracture or displaced fracture in the thoracic spine. 2. Left paracentral disc protrusion at T9-10, indenting the ventral thecal sac without high-grade stenosis. - CT lumbar: 1. New compression fracture of L1 with approximately 15% anterior height loss and minimal retropulsion. Consider MRI for further evaluation. 2. Left paracentral disc protrusion at L5-S1 resulting in lateral recess narrowing with possible impingement upon the traversing left S1 nerve root. 3. Mild spinal canal stenosis at L3-4 and L4-5. 4. Severe facet arthrosis at L5-S1.  I agree with the radiologist interpretation   Cardiac Monitoring: / EKG:  The patient was maintained on a cardiac monitor.  I personally viewed and interpreted the cardiac monitored which showed an underlying rhythm of: NSR   Problem List / ED Course /  Critical interventions / Medication management  I ordered medication including Percocet  for pain  Reevaluation of the patient after these medicines showed that the patient improved I have reviewed the patients home medicines and have made adjustments as needed   Social Determinants of Health:  Tobacco use    Test / Admission - Considered:  Physical exam notable as above, vitals are largely reassuring, I suspect that patient's tachycardia is likely secondary to pain, as he is otherwise in his normal state of health.  Given worsening bilateral lower extremity numbness, will obtain CT imaging of the thoracic/lumbar spine.  See above for results.  Patient was able to ambulate with the help of nursing staff while using a walker without difficulty.  At time of reassessment, patient continues to have back pain but does note improvement in numbness/tingling of lower extremities, says this  is significantly improved as compared to when he presented to the emergency department initially today.  I spoke with the neurosurgeon who was able to review the patient's imaging, see above for their recommendations. Patient does not wish to have opioids prescribed to him, we discussed pain management regimen as he is on Eliquis , he reports tizanidine  has worked well for him in the past, I will represcribe this for him and advised him to continue Tylenol  as needed.  Strict return precautions discussed, patient voiced understanding and is in agreement with this plan.  Will discharge with walker, as patient states that this helped him ambulate easier this evening.  Staffed with Dr. Francesca  Amount and/or Complexity of Data Reviewed Radiology: ordered.  Risk Prescription drug management.       Final diagnoses:  Closed compression fracture of body of L1 vertebra [facility])    ED Discharge Orders          Ordered    tiZANidine  (ZANAFLEX ) 4 MG tablet  Every 6 hours PRN        03/06/24 2143               Glendia Rocky SAILOR, PA-C 03/06/24 2154    Francesca Elsie CROME, MD 03/07/24 [phone]

## 2024-03-06 NOTE — ED Notes (Signed)
 Tried to ambulate pt and able to do it with a walker. Provider informed.

## 2024-03-06 NOTE — ED Notes (Addendum)
 Informed provider new set of vitals before DC, Okay for DC as per Rocky Andrew PA

## 2024-03-06 NOTE — Progress Notes (Signed)
 Orthopedic Tech Progress Note Patient Details:  Henry Figueroa 05-16-1956 [id number]  Patient ID: Henry Figueroa, male   DOB: April 04, 1956, 68 y.o.   MRN: [id number] I called lso order into [facility]. Chandra Dorn PARAS 03/06/2024, 10:09 PM

## 2024-03-13 DIAGNOSIS — S32010D Wedge compression fracture of first lumbar vertebra, subsequent encounter for fracture with routine healing: Secondary | ICD-10-CM | POA: Diagnosis not present

## 2024-03-13 DIAGNOSIS — M546 Pain in thoracic spine: Secondary | ICD-10-CM | POA: Diagnosis not present

## 2024-03-14 ENCOUNTER — Other Ambulatory Visit: Payer: Self-pay | Admitting: Physical Medicine and Rehabilitation

## 2024-03-14 DIAGNOSIS — M5451 Vertebrogenic low back pain: Secondary | ICD-10-CM

## 2024-03-23 ENCOUNTER — Ambulatory Visit

## 2024-03-23 ENCOUNTER — Telehealth: Payer: Self-pay

## 2024-03-23 ENCOUNTER — Other Ambulatory Visit: Payer: Self-pay

## 2024-03-23 ENCOUNTER — Encounter: Payer: Self-pay | Admitting: Internal Medicine

## 2024-03-23 ENCOUNTER — Ambulatory Visit (INDEPENDENT_AMBULATORY_CARE_PROVIDER_SITE_OTHER): Admitting: Internal Medicine

## 2024-03-23 ENCOUNTER — Emergency Department (HOSPITAL_COMMUNITY)

## 2024-03-23 ENCOUNTER — Emergency Department (HOSPITAL_COMMUNITY)
Admission: EM | Admit: 2024-03-23 | Discharge: 2024-03-23 | Disposition: A | Discharge status: Home / Self Care | Source: Ambulatory Visit | Attending: Emergency Medicine | Admitting: Emergency Medicine

## 2024-03-23 ENCOUNTER — Encounter (HOSPITAL_COMMUNITY): Payer: Self-pay

## 2024-03-23 VITALS — BP 100/80 | HR 105 | Temp 97.7°F | Resp 16 | Ht 72.0 in | Wt 213.6 lb

## 2024-03-23 DIAGNOSIS — N189 Chronic kidney disease, unspecified: Secondary | ICD-10-CM | POA: Insufficient documentation

## 2024-03-23 DIAGNOSIS — I517 Cardiomegaly: Secondary | ICD-10-CM | POA: Diagnosis not present

## 2024-03-23 DIAGNOSIS — I959 Hypotension, unspecified: Secondary | ICD-10-CM

## 2024-03-23 DIAGNOSIS — N179 Acute kidney failure, unspecified: Secondary | ICD-10-CM | POA: Diagnosis not present

## 2024-03-23 DIAGNOSIS — R9431 Abnormal electrocardiogram [ECG] [EKG]: Secondary | ICD-10-CM | POA: Insufficient documentation

## 2024-03-23 DIAGNOSIS — N178 Other acute kidney failure: Secondary | ICD-10-CM

## 2024-03-23 DIAGNOSIS — R799 Abnormal finding of blood chemistry, unspecified: Secondary | ICD-10-CM | POA: Diagnosis present

## 2024-03-23 DIAGNOSIS — Z85828 Personal history of other malignant neoplasm of skin: Secondary | ICD-10-CM | POA: Insufficient documentation

## 2024-03-23 DIAGNOSIS — N1832 Chronic kidney disease, stage 3b: Secondary | ICD-10-CM | POA: Diagnosis not present

## 2024-03-23 DIAGNOSIS — Z79899 Other long term (current) drug therapy: Secondary | ICD-10-CM | POA: Insufficient documentation

## 2024-03-23 DIAGNOSIS — M549 Dorsalgia, unspecified: Secondary | ICD-10-CM | POA: Diagnosis not present

## 2024-03-23 DIAGNOSIS — R778 Other specified abnormalities of plasma proteins: Secondary | ICD-10-CM | POA: Diagnosis not present

## 2024-03-23 DIAGNOSIS — I82411 Acute embolism and thrombosis of right femoral vein: Secondary | ICD-10-CM | POA: Diagnosis not present

## 2024-03-23 DIAGNOSIS — I129 Hypertensive chronic kidney disease with stage 1 through stage 4 chronic kidney disease, or unspecified chronic kidney disease: Secondary | ICD-10-CM | POA: Diagnosis not present

## 2024-03-23 DIAGNOSIS — R079 Chest pain, unspecified: Secondary | ICD-10-CM | POA: Diagnosis not present

## 2024-03-23 DIAGNOSIS — I82509 Chronic embolism and thrombosis of unspecified deep veins of unspecified lower extremity: Secondary | ICD-10-CM | POA: Insufficient documentation

## 2024-03-23 DIAGNOSIS — N19 Unspecified kidney failure: Secondary | ICD-10-CM | POA: Insufficient documentation

## 2024-03-23 DIAGNOSIS — R000 Tachycardia, unspecified: Secondary | ICD-10-CM | POA: Insufficient documentation

## 2024-03-23 LAB — CBC WITH DIFFERENTIAL/PLATELET
Basophils Absolute: 0 K/uL (ref 0.0–0.1)
Basophils Relative: 0.5 % (ref 0.0–3.0)
Eosinophils Absolute: 0 K/uL (ref 0.0–0.7)
Eosinophils Relative: 0.7 % (ref 0.0–5.0)
HCT: 43.5 % (ref 39.0–52.0)
Hemoglobin: 14.6 g/dL (ref 13.0–17.0)
Lymphocytes Relative: 23.7 % (ref 12.0–46.0)
Lymphs Abs: 1.5 K/uL (ref 0.7–4.0)
MCHC: 33.5 g/dL (ref 30.0–36.0)
MCV: 100.1 fl — ABNORMAL HIGH (ref 78.0–100.0)
Monocytes Absolute: 0.6 K/uL (ref 0.1–1.0)
Monocytes Relative: 8.6 % (ref 3.0–12.0)
Neutro Abs: 4.3 K/uL (ref 1.4–7.7)
Neutrophils Relative %: 66.5 % (ref 43.0–77.0)
Platelets: 309 K/uL (ref 150.0–400.0)
RBC: 4.35 Mil/uL (ref 4.22–5.81)
RDW: 12.2 % (ref 11.5–15.5)
WBC: 6.5 K/uL (ref 4.0–10.5)

## 2024-03-23 LAB — URINALYSIS, ROUTINE W REFLEX MICROSCOPIC
Bilirubin Urine: NEGATIVE
Hgb urine dipstick: NEGATIVE
Ketones, ur: NEGATIVE
Leukocytes,Ua: NEGATIVE
Nitrite: NEGATIVE
Specific Gravity, Urine: 1.02 (ref 1.000–1.030)
Total Protein, Urine: 30 — AB
Urine Glucose: NEGATIVE
Urobilinogen, UA: 0.2 (ref 0.0–1.0)
pH: 6 (ref 5.0–8.0)

## 2024-03-23 LAB — CBC
HCT: 47.2 % (ref 39.0–52.0)
Hemoglobin: 16.1 g/dL (ref 13.0–17.0)
MCH: 34.5 pg — ABNORMAL HIGH (ref 26.0–34.0)
MCHC: 34.1 g/dL (ref 30.0–36.0)
MCV: 101.3 fL — ABNORMAL HIGH (ref 80.0–100.0)
Platelets: 317 K/uL (ref 150–400)
RBC: 4.66 MIL/uL (ref 4.22–5.81)
RDW: 11.7 % (ref 11.5–15.5)
WBC: 7 K/uL (ref 4.0–10.5)
nRBC: 0 % (ref 0.0–0.2)

## 2024-03-23 LAB — BASIC METABOLIC PANEL WITH GFR
Anion gap: 13 (ref 5–15)
BUN: 63 mg/dL — ABNORMAL HIGH (ref 6–23)
BUN: 67 mg/dL — ABNORMAL HIGH (ref 8–23)
CO2: 22 meq/L (ref 19–32)
CO2: 21 mmol/L — ABNORMAL LOW (ref 22–32)
Calcium: 9.4 mg/dL (ref 8.4–10.5)
Calcium: 10.4 mg/dL — ABNORMAL HIGH (ref 8.9–10.3)
Chloride: 103 meq/L (ref 96–112)
Chloride: 105 mmol/L (ref 98–111)
Creatinine, Ser: 2.67 mg/dL — ABNORMAL HIGH (ref 0.40–1.50)
Creatinine, Ser: 2.84 mg/dL — ABNORMAL HIGH (ref 0.61–1.24)
GFR, Estimated: 23 mL/min — ABNORMAL LOW (ref >60)
GFR: 23.64 mL/min — ABNORMAL LOW (ref >60.00)
Glucose, Bld: 110 mg/dL — ABNORMAL HIGH (ref 70–99)
Glucose, Bld: 111 mg/dL — ABNORMAL HIGH (ref 70–99)
Potassium: 3.7 meq/L (ref 3.5–5.1)
Potassium: 4.6 mmol/L (ref 3.5–5.1)
Sodium: 140 meq/L (ref 135–145)
Sodium: 139 mmol/L (ref 135–145)

## 2024-03-23 LAB — CORTISOL: Cortisol, Plasma: 13.6 ug/dL

## 2024-03-23 LAB — BRAIN NATRIURETIC PEPTIDE: Pro B Natriuretic peptide (BNP): 44 pg/mL (ref 0.0–100.0)

## 2024-03-23 LAB — TROPONIN I (HIGH SENSITIVITY)
High Sens Troponin I: 67 ng/L (ref 2–17)
Troponin I (High Sensitivity): 12 ng/L (ref <18)
Troponin I (High Sensitivity): 14 ng/L (ref <18)

## 2024-03-23 LAB — D-DIMER, QUANTITATIVE: D-Dimer, Quant: 1.31 ug{FEU}/mL — ABNORMAL HIGH (ref <0.50)

## 2024-03-23 MED ORDER — ACETAMINOPHEN 500 MG PO TABS
1000.0000 mg | ORAL_TABLET | Freq: Once | ORAL | Status: AC
  Administered 2024-03-23: 1000 mg via ORAL
  Filled 2024-03-23: qty 2

## 2024-03-23 MED ORDER — LACTATED RINGERS IV BOLUS
1000.0000 mL | Freq: Once | INTRAVENOUS | Status: AC
  Administered 2024-03-23: 1000 mL via INTRAVENOUS

## 2024-03-23 NOTE — ED Notes (Signed)
 Pt verbalized understanding of discharge instructions.

## 2024-03-23 NOTE — Discharge Instructions (Signed)
 You were seen in the emergency department for abnormal labs seen in clinic.  While you are here we did labs, an EKG and chest x-ray.  You were found to have an acute kidney injury.  We gave you fluids in the emergency department.  Please follow-up with your nephrologist and PCP tomorrow early next week for reevaluation.  Come back to the emergency department if you have chest pain, shortness of breath, significantly decreased urine output or if you have any other reason to believe you need emergency care

## 2024-03-23 NOTE — Telephone Encounter (Signed)
 Erroneous encounter

## 2024-03-23 NOTE — Progress Notes (Signed)
 Subjective:  Patient ID: Henry Figueroa, male    DOB: Nov 25, 1955  Age: 68 y.o. MRN: [id number]  CC: Hospitalization Follow-up (Discuss medications )   HPI Henry Figueroa presents for f/up ----  Discussed the use of AI scribe software for clinical note transcription with the patient, who gave verbal consent to proceed.  History of Present Illness Henry Figueroa is a 68 year old male who presents with back pain and numbness in his legs.  Approximately seven weeks ago, he experienced swelling in his right leg, which was unusual as both legs typically swell. An ultrasound revealed a blood clot from his ankle to his groin. He was started on Eliquis  and underwent a procedure to remove part of the clot. He continues on Eliquis , taking 2 mg and 4 mg doses, with plans to reduce to 2.5 mg. No current swelling in his legs or feet, and he has some bruising but no active bleeding.  Two weeks ago, he fell at his mother's house, landing on his back. Initially, he felt okay but later experienced severe pain and numbness in his legs. An x-ray at the hospital showed a fracture at L1. He was given a walker and is scheduled for an MRI. He currently takes tizanidine  for pain, which helps him sleep through the night without pain. He experiences numbness, weakness, and tingling mainly in his feet.  He feels fatigued but has no dizziness or lightheadedness. He does not feel his heart racing. He has scabs from a fall and a cat scratch, which are healing without signs of infection. He has a history of smoking but has quit and does not consume alcohol.    Outpatient Medications Prior to Visit  Medication Sig Dispense Refill   acetaminophen  (TYLENOL ) 650 MG CR tablet Take 1,300 mg by mouth every 8 (eight) hours as needed for pain.     apixaban  (ELIQUIS ) 5 MG TABS tablet Take 1 tablet (5 mg total) by mouth 2 (two) times daily. Start taking after completion of starter pack. 180 tablet 1   buPROPion  (WELLBUTRIN  XL)  150 MG 24 hr tablet TAKE 1 TABLET BY MOUTH EVERY DAY 90 tablet 0   carvedilol  (COREG ) 6.25 MG tablet TAKE 1 TABLET TWICE DAILY WITH MEALS 180 tablet 3   DULoxetine  (CYMBALTA ) 60 MG capsule TAKE 1 CAPSULE BY MOUTH EVERY DAY 90 capsule 0   ondansetron  (ZOFRAN -ODT) 4 MG disintegrating tablet Take 1 tablet (4 mg total) by mouth every 8 (eight) hours as needed for nausea or vomiting. (Patient taking differently: Take 4 mg by mouth daily.) 90 tablet 1   pantoprazole  (PROTONIX ) 40 MG tablet Take 1 tablet (40 mg total) by mouth daily. 90 tablet 3   potassium chloride  SA (KLOR-CON  M15) 15 MEQ tablet TAKE 1 TABLET THREE TIMES DAILY 270 tablet 0   rosuvastatin  (CRESTOR ) 10 MG tablet TAKE 1 TABLET EVERY DAY 90 tablet 3   SUCRALFATE  Take 1 g by mouth daily.     tiZANidine  (ZANAFLEX ) 4 MG tablet Take 1 tablet (4 mg total) by mouth every 6 (six) hours as needed for muscle spasms. 30 tablet 0   olmesartan  (BENICAR ) 20 MG tablet Take 1 tablet (20 mg total) by mouth daily. 90 tablet 0   triamterene -hydrochlorothiazide  (DYAZIDE) 37.5-25 MG capsule Take 1 each (1 capsule total) by mouth daily. 90 capsule 0   No facility-administered medications prior to visit.    ROS Review of Systems  Constitutional:  Positive for fatigue. Negative for appetite change, chills,  diaphoresis and fever.  HENT: Negative.    Eyes: Negative.   Respiratory: Negative.  Negative for cough, chest tightness, shortness of breath and wheezing.   Cardiovascular:  Negative for chest pain, palpitations and leg swelling.  Gastrointestinal: Negative.  Negative for abdominal pain, constipation, diarrhea, nausea and vomiting.  Genitourinary: Negative.  Negative for difficulty urinating, dysuria and hematuria.  Musculoskeletal:  Positive for back pain and gait problem. Negative for joint swelling.  Neurological:  Negative for dizziness, weakness and light-headedness.  Hematological:  Negative for adenopathy. Does not bruise/bleed easily.   Psychiatric/Behavioral:  Positive for dysphoric mood. The patient is nervous/anxious.     Objective:  BP 100/80 (BP Location: Left Arm, Patient Position: Sitting, Cuff Size: Normal)   Pulse (!) 105   Temp 97.7 F (36.5 C) (Oral)   Resp 16   Ht 6' (1.829 m)   Wt 213 lb 9.6 oz (96.9 kg)   SpO2 96%   BMI 28.97 kg/m   BP Readings from Last 3 Encounters:  03/23/24 (!) 116/95  03/23/24 100/80  03/06/24 (!) 163/120             Wt Readings from Last 3 Encounters:  03/23/24 213 lb (96.6 kg)  03/23/24 213 lb 9.6 oz (96.9 kg)  03/02/24 224 lb 11.2 oz (101.9 kg)    Physical Exam Vitals reviewed.  Constitutional:      General: He is not in acute distress.    Appearance: He is ill-appearing. He is not toxic-appearing or diaphoretic.  HENT:     Nose: Nose normal.     Mouth/Throat:     Mouth: Mucous membranes are moist.  Eyes:     General: No scleral icterus.    Conjunctiva/sclera: Conjunctivae normal.  Cardiovascular:     Rate and Rhythm: Normal rate. Rhythm irregular.     Heart sounds: No murmur heard.    No friction rub. No gallop.     Comments: EKG-- SR with marked SA with 1st degree AV block, 90 bpm Low voltage in III is not new Low voltage in aVF is new No LVH Pulmonary:     Effort: Pulmonary effort is normal.     Breath sounds: No stridor. No wheezing, rhonchi or rales.  Abdominal:     General: Abdomen is flat.     Palpations: There is no mass.     Tenderness: There is no abdominal tenderness. There is no guarding.     Hernia: No hernia is present.  Musculoskeletal:     Cervical back: Neck supple.     Right lower leg: No edema.     Left lower leg: No edema.  Skin:    Coloration: Skin is not jaundiced.     Findings: No erythema or rash.  Neurological:     Mental Status: He is alert.  Psychiatric:        Attention and Perception: He is inattentive. He does not perceive visual hallucinations.        Mood and Affect: Mood is anxious. Mood is not  depressed. Affect is flat.        Speech: Speech normal.        Behavior: Behavior normal. Behavior is cooperative.        Thought Content: Thought content normal. Thought content is not paranoid or delusional. Thought content does not include homicidal or suicidal ideation. Thought content does not include homicidal plan.        Cognition and Memory: Cognition normal.     Lab  Results  Component Value Date   WBC 7.0 03/23/2024   HGB 16.1 03/23/2024   HCT 47.2 03/23/2024   PLT 317 03/23/2024   GLUCOSE 110 (H) 03/23/2024   CHOL 146 04/09/2023   TRIG 103.0 04/09/2023   HDL 93.40 04/09/2023   LDLDIRECT 111.0 05/30/2019   LDLCALC 32 04/09/2023   ALT 19 03/02/2024   AST 22 03/02/2024   NA 140 03/23/2024   K 3.7 03/23/2024   CL 103 03/23/2024   CREATININE 2.67 (H) 03/23/2024   BUN 63 (H) 03/23/2024   CO2 22 03/23/2024   TSH 0.78 12/31/2023   PSA 1.63 04/09/2023   INR 1.02 07/29/2018   HGBA1C 4.8 04/09/2023    CT Lumbar Spine Wo Contrast Result Date: 03/06/2024 EXAM: CT OF THE LUMBAR SPINE WITHOUT CONTRAST 03/06/2024 05:49:18 PM TECHNIQUE: CT of the lumbar spine was performed without the administration of intravenous contrast. Multiplanar reformatted images are provided for review. Automated exposure control, iterative reconstruction, and/or weight based adjustment of the mA/kV was utilized to reduce the radiation dose to as low as reasonably achievable. COMPARISON: MRI lumbar spine 11/28/2023 CLINICAL HISTORY: Fall, back pain. Per triage notes: Pt BIB EMS from home with reports of chronic lower back pain with numbness and tingling to his lower extremities. Pt fell on Saturday and has been unable to walk today. Pt has pain with movement. FINDINGS: BONES AND ALIGNMENT: There is a new compression fracture of L1 with fracture line extending through the anterior cortex and superior endplate irregularity of the superior endplate with approximately 15% height loss anteriorly. There is minimal  retropulsion at the superior endplate. Chronic irregularity of the L2 superior endplate. Degenerative endplate changes with sclerosis and cyst formation along the right aspect of L3-4. Vertebral body heights are otherwise maintained. 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 DEGENERATIVE CHANGES: Small disc bulges at multiple levels. There is moderate disc space narrowing at L3-4. Disc bulge, facet arthrosis, and thickening of the ligaments and plenum at L3-4 resulting in mild spinal canal stenosis. Additional disc bulge, facet arthrosis, and thickening of the ligamentum flavum at L4-5 resulting in mild spinal canal stenosis. Left paracentral disc protrusion at L5-S1 resulting in lateral recess narrowing with possible impingement upon the traversing left S1 nerve root. There is severe facet arthrosis at L5-S1. Moderate foraminal stenosis on the right at L3-4. Mild foraminal stenosis on the left at L4-5. There is mild bilateral foraminal stenosis at L5-S1. SOFT TISSUES: The paraspinal musculature is unremarkable. There is mild-to-moderate atherosclerosis of the abdominal aorta and branch vessels. IMPRESSION: 1. New compression fracture of L1 with approximately 15% anterior height loss and minimal retropulsion. Consider MRI for further evaluation. 2. Left paracentral disc protrusion at L5-S1 resulting in lateral recess narrowing with possible impingement upon the traversing left S1 nerve root. 3. Mild spinal canal stenosis at L3-4 and L4-5. 4. Severe facet arthrosis at L5-S1. Electronically signed by: Henry Mania MD 03/06/2024 06:57 PM EDT RP Workstation: [id number]   CT Thoracic Spine Wo Contrast Result Date: 03/06/2024 EXAM: CT THORACIC SPINE WITHOUT CONTRAST 03/06/2024 05:49:18 PM TECHNIQUE: CT of the thoracic spine was performed without the  administration of intravenous contrast. Multiplanar reformatted images are provided for review. Automated exposure control, iterative reconstruction, and/or weight based adjustment of the mA/kV was utilized to reduce the radiation dose to as low as reasonably achievable. COMPARISON:  CTA chest 03/06/2024 CLINICAL HISTORY: Fall, back pain. Per triage notes: Pt BIB EMS from home with reports of chronic lower back pain with numbness and tingling to his lower extremities. Pt fell on Saturday and has been unable to walk today. Pt has pain with movement. FINDINGS: BONES AND ALIGNMENT: There is mild straightening of the normal thoracic kyphosis. No listhesis. Normal facet alignment. There is a nondisplaced fracture of the T12 spinous process. No compression fracture or displaced fracture in the thoracic spine. DEGENERATIVE CHANGES: Degenerative endplate osteophytes at multiple levels. Schmorl's nodes at multiple levels. Arthrosis at multiple levels. There is no CT evidence of large disc herniation or high-grade osseospinal canal stenosis. There is a left paracentral disc protrusion at T9-10 which indents the ventral thecal sac without high-grade stenosis. There is no high-grade osseous foraminal stenosis in the thoracic spine. SOFT TISSUES: The paraspinal musculature is unremarkable. VASCULATURE: There is mild atherosclerosis of the visualized thoracic aorta. IMPRESSION: 1. Nondisplaced fracture of the T12 spinous process. No compression fracture or displaced fracture in the thoracic spine. 2. Left paracentral disc protrusion at T9-10, indenting the ventral thecal sac without high-grade stenosis. Electronically signed by: Henry Mania MD 03/06/2024 06:48 PM EDT RP Workstation: [id number]    Assessment & Plan:  Hypotension, unspecified hypotension type- He is dehydrated. Other labs are normal. -     EKG 12-Lead -     Basic metabolic panel with GFR; Future -     Cortisol; Future -     CBC with Differential/Platelet;  Future -     Brain natriuretic peptide; Future -     Troponin I (High Sensitivity); Future -     D-dimer, quantitative; Future -     Urinalysis, Routine w reflex microscopic; Future  Acute deep vein thrombosis (DVT) of femoral vein of right lower extremity (HCC) -     D-dimer, quantitative; Future  Abnormal electrocardiogram (ECG) (EKG)- Troponin is elevated. He was referred to the ED.  Acute renal failure with other specified pathological kidney lesion superimposed on stage 3b chronic kidney disease (HCC)     Follow-up: Return in about 3 weeks (around 04/13/2024).  Debby Molt, MD

## 2024-03-23 NOTE — Patient Instructions (Signed)

## 2024-03-23 NOTE — Telephone Encounter (Signed)
Ask him to go to the ED

## 2024-03-23 NOTE — Telephone Encounter (Signed)
 Spoke with patient today and info given.  Patient advise to go to [facility] ED.
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
## 2024-03-23 NOTE — ED Triage Notes (Signed)
 Patient arrives stating dr sent him for lab level indicating heart attack or having had one. Troponin level 67 per chart review. Patient denies cp, shob, and diaphoresis.

## 2024-03-23 NOTE — Telephone Encounter (Signed)
 CRITICAL VALUE STICKER  CRITICAL VALUE: Troponin I 67  RECEIVER (on-site recipient of call): Hadassah  DATE & TIME NOTIFIED: 03/23/2024 @ 2:00 pm  MESSENGER (representative from lab): Saa  MD NOTIFIED: Yes  TIME OF NOTIFICATION: 2:03 pm

## 2024-03-30 ENCOUNTER — Other Ambulatory Visit: Payer: Self-pay | Admitting: Nephrology

## 2024-03-30 DIAGNOSIS — I7121 Aneurysm of the ascending aorta, without rupture: Secondary | ICD-10-CM | POA: Diagnosis not present

## 2024-03-30 DIAGNOSIS — N179 Acute kidney failure, unspecified: Secondary | ICD-10-CM

## 2024-03-30 DIAGNOSIS — N186 End stage renal disease: Secondary | ICD-10-CM | POA: Diagnosis not present

## 2024-03-30 DIAGNOSIS — E559 Vitamin D deficiency, unspecified: Secondary | ICD-10-CM | POA: Diagnosis not present

## 2024-03-30 DIAGNOSIS — I129 Hypertensive chronic kidney disease with stage 1 through stage 4 chronic kidney disease, or unspecified chronic kidney disease: Secondary | ICD-10-CM | POA: Diagnosis not present

## 2024-03-30 DIAGNOSIS — I82409 Acute embolism and thrombosis of unspecified deep veins of unspecified lower extremity: Secondary | ICD-10-CM | POA: Diagnosis not present

## 2024-03-30 DIAGNOSIS — N182 Chronic kidney disease, stage 2 (mild): Secondary | ICD-10-CM | POA: Diagnosis not present

## 2024-03-30 DIAGNOSIS — R799 Abnormal finding of blood chemistry, unspecified: Secondary | ICD-10-CM | POA: Diagnosis not present

## 2024-03-30 DIAGNOSIS — R809 Proteinuria, unspecified: Secondary | ICD-10-CM | POA: Diagnosis not present

## 2024-03-31 ENCOUNTER — Ambulatory Visit
Admission: RE | Admit: 2024-03-31 | Discharge: 2024-03-31 | Disposition: A | Discharge status: Home / Self Care | Source: Ambulatory Visit | Attending: Nephrology | Admitting: Nephrology

## 2024-03-31 DIAGNOSIS — N2889 Other specified disorders of kidney and ureter: Secondary | ICD-10-CM | POA: Diagnosis not present

## 2024-03-31 DIAGNOSIS — N179 Acute kidney failure, unspecified: Secondary | ICD-10-CM

## 2024-04-04 ENCOUNTER — Ambulatory Visit
Admission: RE | Admit: 2024-04-04 | Discharge: 2024-04-04 | Disposition: A | Discharge status: Home / Self Care | Source: Ambulatory Visit | Attending: Physical Medicine and Rehabilitation | Admitting: Physical Medicine and Rehabilitation

## 2024-04-04 DIAGNOSIS — M5126 Other intervertebral disc displacement, lumbar region: Secondary | ICD-10-CM | POA: Diagnosis not present

## 2024-04-04 DIAGNOSIS — M47816 Spondylosis without myelopathy or radiculopathy, lumbar region: Secondary | ICD-10-CM | POA: Diagnosis not present

## 2024-04-04 DIAGNOSIS — M5451 Vertebrogenic low back pain: Secondary | ICD-10-CM

## 2024-04-04 DIAGNOSIS — M48061 Spinal stenosis, lumbar region without neurogenic claudication: Secondary | ICD-10-CM | POA: Diagnosis not present

## 2024-04-08 ENCOUNTER — Other Ambulatory Visit (HOSPITAL_BASED_OUTPATIENT_CLINIC_OR_DEPARTMENT_OTHER): Payer: Self-pay

## 2024-04-08 MED ORDER — FLUZONE HIGH-DOSE 0.5 ML IM SUSY
0.5000 mL | PREFILLED_SYRINGE | Freq: Once | INTRAMUSCULAR | 0 refills | Status: AC
  Filled 2024-04-08: qty 0.5, 1d supply, fill #0

## 2024-04-11 ENCOUNTER — Other Ambulatory Visit (HOSPITAL_COMMUNITY): Payer: Self-pay

## 2024-04-11 MED ORDER — COVID-19 MRNA VAC-TRIS(PFIZER) 30 MCG/0.3ML IM SUSY
PREFILLED_SYRINGE | INTRAMUSCULAR | 0 refills | Status: AC
  Filled 2024-04-11 (×2): qty 0.3, 1d supply, fill #0

## 2024-04-17 ENCOUNTER — Ambulatory Visit: Admitting: Internal Medicine

## 2024-04-18 ENCOUNTER — Telehealth: Payer: Self-pay | Admitting: *Deleted

## 2024-04-18 DIAGNOSIS — M170 Bilateral primary osteoarthritis of knee: Secondary | ICD-10-CM | POA: Diagnosis not present

## 2024-04-18 DIAGNOSIS — S32010A Wedge compression fracture of first lumbar vertebra, initial encounter for closed fracture: Secondary | ICD-10-CM | POA: Diagnosis not present

## 2024-04-18 NOTE — Telephone Encounter (Signed)
   Pre-operative Risk Assessment    Patient Name: Henry Figueroa  DOB: 1956/05/15 MRN: [id number]   Date of last office visit: 09/24/23 DR. SKAINS Date of next office visit: NONE   Request for Surgical Clearance    Procedure:  L1 KYPHOPLASTY  Date of Surgery:  Clearance TBD                                Surgeon:  DR. CORDELLA RHEIN  Surgeon's Group or Practice Name:  [facility] Phone number:  [phone] JENNIFER BORDIUK Fax number:  [phone]   Type of Clearance Requested:   - Medical  - Pharmacy:  Hold Apixaban  (Eliquis )     Type of Anesthesia:  General    Additional requests/questions:    Bonney Niels Jest   04/18/2024, 5:46 PM

## 2024-04-19 NOTE — Telephone Encounter (Signed)
 Pharmacy please advise on holding Apixaban  prior to L1 Kyphoplasty scheduled for TBD. Last labs (CBC and BMET) 03/23/2024.  Thank you.

## 2024-04-21 NOTE — Telephone Encounter (Signed)
   Name: Henry Figueroa  DOB: [birth year]  MRN: [id number]  Primary Cardiologist: Oneil Parchment, MD   Preoperative team, please contact this patient and set up a phone call appointment for further preoperative risk assessment. Please obtain consent and complete medication review. Thank you for your help.  I confirm that guidance regarding antiplatelet and oral anticoagulation therapy has been completed and, if necessary, noted below.  Per office protocol, patient can hold Eliquis  for 3 days prior to procedure.    I also confirmed the patient resides in the state of [place] . As per [facility] Medical Board telemedicine laws, the patient must reside in the state in which the provider is licensed.   Lamarr Satterfield, NP 04/21/2024, 5:01 PM [facility]

## 2024-04-21 NOTE — Telephone Encounter (Signed)
 Patient with diagnosis of Afib on Eliquis  for anticoagulation.    Procedure:  L1 KYPHOPLASTY   Date of Surgery:  Clearance TBD   CHA2DS2-VASc Score = 2   This indicates a 2.2% annual risk of stroke. The patient's score is based upon: CHF History: 0 HTN History: 1 Diabetes History: 0 Stroke History: 0 Vascular Disease History: 0 Age Score: 1 Gender Score: 0     CrCl 30 ml/min Platelet count 317 K  Patient has not had an Afib/aflutter ablation within the last 3 months or DCCV within the last 30 days   Per office protocol, patient can hold Eliquis  for 3 days prior to procedure.   Patient will not need bridging with Lovenox (enoxaparin) around procedure.  **This guidance is not considered finalized until pre-operative APP has relayed final recommendations.**

## 2024-04-24 NOTE — Telephone Encounter (Signed)
 Patient was returning call. Please advise ?

## 2024-04-24 NOTE — Telephone Encounter (Signed)
Left message to call back to schedule tele pre op appt.,

## 2024-04-25 DIAGNOSIS — M170 Bilateral primary osteoarthritis of knee: Secondary | ICD-10-CM | POA: Diagnosis not present

## 2024-04-25 NOTE — Telephone Encounter (Signed)
2nd attempt to reach the pt to schedule a tele pre op appt.

## 2024-04-25 NOTE — Telephone Encounter (Signed)
 Patient returned Pre-op call.

## 2024-04-25 NOTE — Telephone Encounter (Signed)
 S/w the pt and he tells me he has cancelled his procedure. We will not need to provider clearance.

## 2024-05-02 DIAGNOSIS — M170 Bilateral primary osteoarthritis of knee: Secondary | ICD-10-CM | POA: Diagnosis not present

## 2024-05-15 ENCOUNTER — Other Ambulatory Visit: Payer: Self-pay | Admitting: Internal Medicine

## 2024-05-15 DIAGNOSIS — F322 Major depressive disorder, single episode, severe without psychotic features: Secondary | ICD-10-CM

## 2024-05-29 DIAGNOSIS — H524 Presbyopia: Secondary | ICD-10-CM | POA: Diagnosis not present

## 2024-05-29 DIAGNOSIS — H52223 Regular astigmatism, bilateral: Secondary | ICD-10-CM | POA: Diagnosis not present

## 2024-05-29 DIAGNOSIS — H5203 Hypermetropia, bilateral: Secondary | ICD-10-CM | POA: Diagnosis not present

## 2024-06-01 ENCOUNTER — Telehealth: Payer: Self-pay | Admitting: Internal Medicine

## 2024-06-01 NOTE — Telephone Encounter (Unsigned)
 Copied from CRM [id number]. Topic: Clinical - Medication Refill >> Jun 01, 2024  9:18 AM Ahlexyia S wrote: Medication: valsartan -hydrochlorothiazide  (DIOVAN -HCT) 320-25 mg tablet  Has the patient contacted their pharmacy? Yes, pharmacy requesting med refill. (Agent: If no, request that the patient contact the pharmacy for the refill. If patient does not wish to contact the pharmacy document the reason why and proceed with request.) (Agent: If yes, when and what did the pharmacy advise?)  This is the patient's preferred pharmacy:  [facility] - [place], [place] - [address] [place] [address] Phone: [phone] Fax: [phone]  Is this the correct pharmacy for this prescription? Yes If no, delete pharmacy and type the correct one.   Has the prescription been filled recently? Not sure.  Is the patient out of the medication? Not sure. Pt was speaking to someone at [facility] about this medication  Has the patient been seen for an appointment in the last year OR does the patient have an upcoming appointment? Yes  Can we respond through MyChart? No  Agent: Please be advised that Rx refills may take up to 3 business days. We ask that you follow-up with your pharmacy.

## 2024-06-02 ENCOUNTER — Ambulatory Visit: Admission: EM | Admit: 2024-06-02 | Discharge: 2024-06-02 | Disposition: A | Discharge status: Home / Self Care

## 2024-06-02 ENCOUNTER — Encounter (HOSPITAL_BASED_OUTPATIENT_CLINIC_OR_DEPARTMENT_OTHER): Payer: Self-pay

## 2024-06-02 ENCOUNTER — Observation Stay (HOSPITAL_BASED_OUTPATIENT_CLINIC_OR_DEPARTMENT_OTHER)
Admission: EM | Admit: 2024-06-02 | Discharge: 2024-06-04 | Disposition: A | Discharge status: Home / Self Care | Attending: Internal Medicine | Admitting: Internal Medicine

## 2024-06-02 ENCOUNTER — Emergency Department (HOSPITAL_BASED_OUTPATIENT_CLINIC_OR_DEPARTMENT_OTHER)

## 2024-06-02 ENCOUNTER — Ambulatory Visit: Payer: Self-pay | Admitting: *Deleted

## 2024-06-02 DIAGNOSIS — F1290 Cannabis use, unspecified, uncomplicated: Secondary | ICD-10-CM | POA: Diagnosis not present

## 2024-06-02 DIAGNOSIS — F1090 Alcohol use, unspecified, uncomplicated: Secondary | ICD-10-CM | POA: Insufficient documentation

## 2024-06-02 DIAGNOSIS — I825Y1 Chronic embolism and thrombosis of unspecified deep veins of right proximal lower extremity: Secondary | ICD-10-CM | POA: Diagnosis not present

## 2024-06-02 DIAGNOSIS — E66811 Obesity, class 1: Secondary | ICD-10-CM | POA: Diagnosis not present

## 2024-06-02 DIAGNOSIS — F32A Depression, unspecified: Secondary | ICD-10-CM | POA: Diagnosis present

## 2024-06-02 DIAGNOSIS — I2510 Atherosclerotic heart disease of native coronary artery without angina pectoris: Secondary | ICD-10-CM | POA: Diagnosis not present

## 2024-06-02 DIAGNOSIS — I161 Hypertensive emergency: Secondary | ICD-10-CM | POA: Diagnosis not present

## 2024-06-02 DIAGNOSIS — I2782 Chronic pulmonary embolism: Secondary | ICD-10-CM | POA: Diagnosis not present

## 2024-06-02 DIAGNOSIS — I5031 Acute diastolic (congestive) heart failure: Secondary | ICD-10-CM | POA: Diagnosis not present

## 2024-06-02 DIAGNOSIS — Z86718 Personal history of other venous thrombosis and embolism: Secondary | ICD-10-CM | POA: Diagnosis not present

## 2024-06-02 DIAGNOSIS — R609 Edema, unspecified: Secondary | ICD-10-CM | POA: Diagnosis present

## 2024-06-02 DIAGNOSIS — I82432 Acute embolism and thrombosis of left popliteal vein: Secondary | ICD-10-CM | POA: Diagnosis not present

## 2024-06-02 DIAGNOSIS — R600 Localized edema: Secondary | ICD-10-CM

## 2024-06-02 DIAGNOSIS — I2699 Other pulmonary embolism without acute cor pulmonale: Secondary | ICD-10-CM | POA: Diagnosis not present

## 2024-06-02 DIAGNOSIS — I110 Hypertensive heart disease with heart failure: Secondary | ICD-10-CM | POA: Insufficient documentation

## 2024-06-02 DIAGNOSIS — I4892 Unspecified atrial flutter: Secondary | ICD-10-CM | POA: Diagnosis not present

## 2024-06-02 DIAGNOSIS — I509 Heart failure, unspecified: Secondary | ICD-10-CM | POA: Diagnosis not present

## 2024-06-02 DIAGNOSIS — E876 Hypokalemia: Secondary | ICD-10-CM | POA: Diagnosis not present

## 2024-06-02 DIAGNOSIS — I7121 Aneurysm of the ascending aorta, without rupture: Secondary | ICD-10-CM | POA: Diagnosis present

## 2024-06-02 DIAGNOSIS — I160 Hypertensive urgency: Secondary | ICD-10-CM | POA: Diagnosis present

## 2024-06-02 DIAGNOSIS — I700 Atherosclerosis of aorta: Secondary | ICD-10-CM | POA: Diagnosis not present

## 2024-06-02 DIAGNOSIS — R0602 Shortness of breath: Secondary | ICD-10-CM | POA: Diagnosis not present

## 2024-06-02 DIAGNOSIS — F01B3 Vascular dementia, moderate, with mood disturbance: Secondary | ICD-10-CM | POA: Insufficient documentation

## 2024-06-02 DIAGNOSIS — Z6832 Body mass index (BMI) 32.0-32.9, adult: Secondary | ICD-10-CM | POA: Insufficient documentation

## 2024-06-02 DIAGNOSIS — N202 Calculus of kidney with calculus of ureter: Secondary | ICD-10-CM | POA: Diagnosis not present

## 2024-06-02 DIAGNOSIS — I21A1 Myocardial infarction type 2: Secondary | ICD-10-CM | POA: Insufficient documentation

## 2024-06-02 DIAGNOSIS — I82411 Acute embolism and thrombosis of right femoral vein: Secondary | ICD-10-CM | POA: Diagnosis not present

## 2024-06-02 DIAGNOSIS — I82509 Chronic embolism and thrombosis of unspecified deep veins of unspecified lower extremity: Secondary | ICD-10-CM | POA: Diagnosis present

## 2024-06-02 DIAGNOSIS — G4733 Obstructive sleep apnea (adult) (pediatric): Secondary | ICD-10-CM | POA: Insufficient documentation

## 2024-06-02 DIAGNOSIS — I517 Cardiomegaly: Secondary | ICD-10-CM | POA: Diagnosis not present

## 2024-06-02 DIAGNOSIS — M7989 Other specified soft tissue disorders: Secondary | ICD-10-CM | POA: Diagnosis not present

## 2024-06-02 LAB — TROPONIN T, HIGH SENSITIVITY
Troponin T High Sensitivity: 36 ng/L — ABNORMAL HIGH (ref 0–19)
Troponin T High Sensitivity: 36 ng/L — ABNORMAL HIGH (ref 0–19)

## 2024-06-02 LAB — CBC
HCT: 38 % — ABNORMAL LOW (ref 39.0–52.0)
Hemoglobin: 13.1 g/dL (ref 13.0–17.0)
MCH: 33 pg (ref 26.0–34.0)
MCHC: 34.5 g/dL (ref 30.0–36.0)
MCV: 95.7 fL (ref 80.0–100.0)
Platelets: 220 K/uL (ref 150–400)
RBC: 3.97 MIL/uL — ABNORMAL LOW (ref 4.22–5.81)
RDW: 12.3 % (ref 11.5–15.5)
WBC: 6.5 K/uL (ref 4.0–10.5)
nRBC: 0 % (ref 0.0–0.2)

## 2024-06-02 LAB — COMPREHENSIVE METABOLIC PANEL WITH GFR
ALT: 11 U/L (ref 0–44)
AST: 19 U/L (ref 15–41)
Albumin: 4.8 g/dL (ref 3.5–5.0)
Alkaline Phosphatase: 52 U/L (ref 38–126)
Anion gap: 12 (ref 5–15)
BUN: 20 mg/dL (ref 8–23)
CO2: 27 mmol/L (ref 22–32)
Calcium: 10 mg/dL (ref 8.9–10.3)
Chloride: 103 mmol/L (ref 98–111)
Creatinine, Ser: 1.08 mg/dL (ref 0.61–1.24)
GFR, Estimated: >60 mL/min (ref >60)
Glucose, Bld: 111 mg/dL — ABNORMAL HIGH (ref 70–99)
Potassium: 3.2 mmol/L — ABNORMAL LOW (ref 3.5–5.1)
Sodium: 141 mmol/L (ref 135–145)
Total Bilirubin: 0.5 mg/dL (ref 0.0–1.2)
Total Protein: 7.5 g/dL (ref 6.5–8.1)

## 2024-06-02 LAB — PRO BRAIN NATRIURETIC PEPTIDE: Pro Brain Natriuretic Peptide: 2203 pg/mL — ABNORMAL HIGH (ref <300.0)

## 2024-06-02 MED ORDER — LABETALOL HCL 5 MG/ML IV SOLN
20.0000 mg | Freq: Once | INTRAVENOUS | Status: AC
  Administered 2024-06-02: 20 mg via INTRAVENOUS
  Filled 2024-06-02: qty 4

## 2024-06-02 MED ORDER — HYDRALAZINE HCL 25 MG PO TABS
25.0000 mg | ORAL_TABLET | Freq: Once | ORAL | Status: AC
  Administered 2024-06-02: 25 mg via ORAL
  Filled 2024-06-02: qty 1

## 2024-06-02 MED ORDER — LABETALOL HCL 5 MG/ML IV SOLN
10.0000 mg | Freq: Once | INTRAVENOUS | Status: AC
  Administered 2024-06-02: 10 mg via INTRAVENOUS
  Filled 2024-06-02: qty 4

## 2024-06-02 MED ORDER — IOHEXOL 350 MG/ML SOLN
125.0000 mL | Freq: Once | INTRAVENOUS | Status: AC | PRN
  Administered 2024-06-02: 125 mL via INTRAVENOUS

## 2024-06-02 MED ORDER — POTASSIUM CHLORIDE CRYS ER 20 MEQ PO TBCR
40.0000 meq | EXTENDED_RELEASE_TABLET | ORAL | Status: AC
  Administered 2024-06-02: 40 meq via ORAL
  Filled 2024-06-02: qty 2

## 2024-06-02 MED ORDER — FUROSEMIDE 10 MG/ML IJ SOLN
40.0000 mg | Freq: Once | INTRAMUSCULAR | Status: AC
  Administered 2024-06-02: 40 mg via INTRAVENOUS
  Filled 2024-06-02: qty 4

## 2024-06-02 NOTE — ED Notes (Signed)
 RT assessed pt, 97% on room air, diminished bilateral bases, no wheezing noted, pt states he has no hx. Asthma, nor COPD.  Will continue to monitor.

## 2024-06-02 NOTE — ED Provider Notes (Signed)
 [facility] EMERGENCY DEPARTMENT AT [facility] Provider Note   CSN: [id number] Arrival date & time: 06/02/24  8195     Patient presents with: Shortness of Breath and Leg Swelling   Henry Figueroa is a 68 y.o. male.   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  Patient reports that today he noticed that both of his ankles and legs were very swollen all the way up to his knees.  Went to urgent care and also was reporting 3 days of shortness of breath but he denies this to me.  Says he has been compliant with his Eliquis .  Also was on Coreg  and reports that he has missed a dose of this once in the past few weeks but has been compliant with it otherwise.  At urgent care they noticed his blood pressure to be in the 200s and decided to for him to the emergency department for additional evaluation.       Prior to Admission medications   Medication Sig Start Date End Date Taking? Authorizing Provider  acetaminophen  (TYLENOL ) 650 MG CR tablet Take 1,300 mg by mouth every 8 (eight) hours as needed for pain.   Yes [provider]  apixaban  (ELIQUIS ) 5 MG TABS tablet Take 1 tablet (5 mg total) by mouth 2 (two) times daily. Start taking after completion of starter pack. 01/21/24  Yes Yates, Madison B, RPH-CPP  buPROPion  (WELLBUTRIN  XL) 150 MG 24 hr tablet TAKE 1 TABLET BY MOUTH EVERY DAY 05/15/24  Yes Joshua Debby CROME, MD  carvedilol  (COREG ) 6.25 MG tablet TAKE 1 TABLET TWICE DAILY WITH MEALS 12/07/23  Yes Joshua Debby CROME, MD  DULoxetine  (CYMBALTA ) 60 MG capsule TAKE 1 CAPSULE BY MOUTH EVERY DAY 05/15/24  Yes Joshua Debby CROME, MD  olmesartan  (BENICAR ) 40 MG tablet Take 40 mg by mouth daily. 03/05/24  Yes [provider]  pantoprazole  (PROTONIX ) 40 MG tablet Take 1 tablet (40 mg total) by mouth daily. Patient taking  differently: Take 40 mg by mouth daily as needed. 11/01/23  Yes Cirigliano, Vito V, DO  potassium chloride  SA (KLOR-CON  M15) 15 MEQ tablet TAKE 1 TABLET THREE TIMES DAILY Patient taking differently: Take 15 mEq by mouth 2 (two) times daily. 09/03/23  Yes Joshua Debby CROME, MD  rosuvastatin  (CRESTOR ) 10 MG tablet TAKE 1 TABLET EVERY DAY 12/07/23  Yes Joshua Debby CROME, MD  SUCRALFATE  Take 1 g by mouth daily. 10/26/22  Yes [provider]  tiZANidine  (ZANAFLEX ) 4 MG tablet Take 1 tablet (4 mg total) by mouth every 6 (six) hours as needed for muscle spasms. 03/06/24  Yes Scott, Rocky SAILOR, PA-C  COVID-19 mRNA vaccine, Pfizer, (COMIRNATY) syringe Inject into the muscle. 04/11/24     ondansetron  (ZOFRAN -ODT) 4 MG disintegrating tablet Take 1 tablet (4 mg total) by mouth every 8 (eight) hours as needed for nausea or vomiting. Patient not taking: Reported on 06/03/2024 12/31/23   May, [name], NP    Allergies: Patient has no known allergies.    Review of Systems  Updated Vital Signs BP (!) 149/99 (BP Location: Right Arm)   Pulse 70   Temp 97.6 F (36.4 C) (Oral)   Resp 16   Ht 6' (1.829 m)   Wt 107.2 kg   SpO2 95%   BMI 32.05 kg/m   Physical Exam Vitals and nursing note reviewed.  Constitutional:      General: He is not in acute distress.    Appearance: He is well-developed.  HENT:     Head: Normocephalic and atraumatic.     Right Ear: External ear normal.     Left Ear: External ear normal.     Nose: Nose normal.  Eyes:     Extraocular Movements: Extraocular movements intact.     Conjunctiva/sclera: Conjunctivae normal.     Pupils: Pupils are equal, round, and reactive to light.  Cardiovascular:     Rate and Rhythm: Normal rate and regular rhythm.     Heart sounds: Murmur (2/6 right sternal border) heard.  Pulmonary:     Effort: Pulmonary effort is normal. No respiratory distress.     Breath sounds: Normal breath sounds.  Musculoskeletal:     Cervical back: Normal range of motion and  neck supple.     Right lower leg: Edema (2+) present.     Left lower leg: Edema (2+) present.  Skin:    General: Skin is warm and dry.  Neurological:     Mental Status: He is alert. Mental status is at baseline.  Psychiatric:        Mood and Affect: Mood normal.        Behavior: Behavior normal.     (all labs ordered are listed, but only abnormal results are displayed) Labs Reviewed  COMPREHENSIVE METABOLIC PANEL WITH GFR - Abnormal; Notable for the following components:      Result Value   Potassium 3.2 (*)    Glucose, Bld 111 (*)    All other components within normal limits  CBC - Abnormal; Notable for the following components:   RBC 3.97 (*)    HCT 38.0 (*)    All other components within normal limits  PRO BRAIN NATRIURETIC PEPTIDE - Abnormal; Notable for the following components:   Pro Brain Natriuretic Peptide 2,203.0 (*)    All other components within normal limits  BASIC METABOLIC PANEL WITH GFR - Abnormal; Notable for the following components:   Potassium 3.1 (*)    Glucose, Bld 111 (*)    All other components within normal limits  CBC - Abnormal; Notable for the following components:   RBC 4.18 (*)    All other components within normal limits  BASIC METABOLIC PANEL WITH GFR - Abnormal; Notable for the following components:   Chloride 97 (*)    Glucose, Bld 107 (*)    All other components within normal limits  TROPONIN T, HIGH SENSITIVITY - Abnormal; Notable for the following components:   Troponin T High Sensitivity 36 (*)    All other components within normal limits  TROPONIN T, HIGH SENSITIVITY - Abnormal; Notable for the following components:   Troponin T High Sensitivity 36 (*)    All other components within normal limits  HIV ANTIBODY (ROUTINE TESTING W REFLEX)  MAGNESIUM  CBC    EKG: EKG Interpretation Date/Time:  Friday June 02 2024 18:15:14 EST Ventricular Rate:  91 PR Interval:  234 QRS Duration:  98 QT Interval:  366 QTC  Calculation: 450 R Axis:   -9  Text Interpretation: Sinus rhythm with 1st degree A-V block Incomplete right bundle branch block Possible Anterior infarct (cited on or before 23-Mar-2024) Abnormal ECG When compared with ECG of 23-Mar-2024 15:00, Incomplete right bundle branch block is now Present Confirmed by Yolande Charleston [phone]) on 06/02/2024 6:47:05 PM  Radiology: ECHOCARDIOGRAM COMPLETE Result Date: 06/03/2024    ECHOCARDIOGRAM REPORT  Patient Name:   Henry Figueroa Date of Exam: 06/03/2024 Medical Rec #:  [id number]       Height:       72.0 in Accession #:    [id number]      Weight:       238.8 lb Date of Birth:  04-26-1956       BSA:          2.297 m Patient Age:    68 years        BP:           194/129 mmHg Patient Gender: M               HR:           82 bpm. Exam Location:  Inpatient Procedure: 2D Echo, Cardiac Doppler, Color Doppler and Intracardiac            Opacification Agent (Both Spectral and Color Flow Doppler were            utilized during procedure). Indications:    CHF-Acute Diastolic I50.31  History:        Patient has prior history of Echocardiogram examinations, most                 recent 04/23/2018. Arrythmias:Atrial Fibrillation; Risk                 Factors:Hypertension and Sleep Apnea.  Sonographer:    Jayson Gaskins Referring Phys: [id number] TIMOTHY S OPYD IMPRESSIONS  1. Poor acoustic windows Definity used. . Left ventricular ejection fraction, by estimation, is 65 to 70%. The left ventricle has normal function. The left ventricle has no regional wall motion abnormalities. There is mild left ventricular hypertrophy. Left ventricular diastolic parameters are indeterminate.  2. Right ventricular systolic function is low normal. The right ventricular size is normal.  3. The mitral valve is normal in structure. Trivial mitral valve regurgitation.  4. AV not well seen Mean gradient through the valve is 6 mm Hg . SABRA The aortic valve was not well visualized. Aortic valve regurgitation is  mild.  5. The inferior vena cava is normal in size with greater than 50% respiratory variability, suggesting right atrial pressure of 3 mmHg. FINDINGS  Left Ventricle: Poor acoustic windows Definity used. Left ventricular ejection fraction, by estimation, is 65 to 70%. The left ventricle has normal function. The left ventricle has no regional wall motion abnormalities. Definity contrast agent was given  IV to delineate the left ventricular endocardial borders. The left ventricular internal cavity size was normal in size. There is mild left ventricular hypertrophy. Left ventricular diastolic parameters are indeterminate. Right Ventricle: The right ventricular size is normal. Right vetricular wall thickness was not assessed. Right ventricular systolic function is low normal. Left Atrium: Left atrial size was normal in size. Right Atrium: Right atrial size was normal in size. Pericardium: There is no evidence of pericardial effusion. Mitral Valve: The mitral valve is normal in structure. Trivial mitral valve regurgitation. Tricuspid Valve: The tricuspid valve is normal in structure. Tricuspid valve regurgitation is trivial. Aortic Valve: AV not well seen Mean gradient through the valve is 6 mm Hg. The aortic valve was not well visualized. Aortic valve regurgitation is mild. Aortic regurgitation PHT measures 667 msec. Aortic valve mean gradient measures 6.0 mmHg. Aortic valve peak gradient measures 9.6 mmHg. Aortic valve area, by VTI measures 2.36 cm. Pulmonic Valve: The pulmonic valve was not well visualized. Pulmonic valve regurgitation is not visualized. No evidence  of pulmonic stenosis. Aorta: The aortic root is normal in size and structure. Venous: The inferior vena cava is normal in size with greater than 50% respiratory variability, suggesting right atrial pressure of 3 mmHg. IAS/Shunts: No atrial level shunt detected by color flow Doppler.  LEFT VENTRICLE PLAX 2D LVIDd:         5.40 cm LVIDs:         3.90 cm LV  PW:         1.30 cm LV IVS:        1.00 cm LVOT diam:     2.00 cm LV SV:         54 LV SV Index:   24 LVOT Area:     3.14 cm  RIGHT VENTRICLE RV S prime:     15.90 cm/s TAPSE (M-mode): 2.6 cm LEFT ATRIUM             Index        RIGHT ATRIUM           Index LA Vol (A2C):   76.4 ml 33.26 ml/m  RA Area:     23.60 cm LA Vol (A4C):   48.6 ml 21.16 ml/m  RA Volume:   69.90 ml  30.43 ml/m LA Biplane Vol: 62.3 ml 27.12 ml/m  AORTIC VALVE AV Area (Vmax):    2.39 cm AV Area (Vmean):   2.69 cm AV Area (VTI):     2.36 cm AV Vmax:           155.00 cm/s AV Vmean:          111.000 cm/s AV VTI:            0.230 m AV Peak Grad:      9.6 mmHg AV Mean Grad:      6.0 mmHg LVOT Vmax:         118.00 cm/s LVOT Vmean:        95.200 cm/s LVOT VTI:          0.173 m LVOT/AV VTI ratio: 0.75 AI PHT:            667 msec  AORTA Ao Root diam: 3.90 cm MITRAL VALVE MV Area (PHT): 4.04 cm    SHUNTS MV Decel Time: 188 msec    Systemic VTI:  0.17 m MV E velocity: 86.50 cm/s  Systemic Diam: 2.00 cm Vina Gull MD Electronically signed by Vina Gull MD Signature Date/Time: 06/03/2024/12:50:21 PM    Final    CT VENOGRAM ABD/PEL Result Date: 06/02/2024 EXAM: CT ABDOMEN AND PELVIS WITH CONTRAST 06/02/2024 09:24:26 PM TECHNIQUE: CT of the abdomen and pelvis was performed with the administration of 125 mL of iohexol  (OMNIPAQUE ) 350 MG/ML injection. Multiplanar reformatted images are provided for review. Automated exposure control, iterative reconstruction, and/or weight-based adjustment of the mA/kV was utilized to reduce the radiation dose to as low as reasonably achievable. COMPARISON: MRI abdomen 11/05/2022 and same day lower extremity ultrasound . CLINICAL HISTORY: possible IVC clot FINDINGS: LOWER CHEST: No acute abnormality. LIVER: The liver is unremarkable. GALLBLADDER AND BILE DUCTS: Cholecystectomy. No biliary ductal dilatation. SPLEEN: No acute abnormality. PANCREAS: 10.2 cm cystic lesion in the tail of the pancreas previously evaluated  with MRI 11/05/2022. See that report for details and recommendations. ADRENAL GLANDS: No acute abnormality. KIDNEYS, URETERS AND BLADDER: Nonobstructing left nephrolithiasis. 3 mm stone in the distal right ureter. No hydronephrosis. Symmetric nephrograms. No perinephric or periureteral stranding. Urinary bladder is unremarkable. GI AND BOWEL: Stomach demonstrates no  acute abnormality. Normal appendix. There is no bowel obstruction. PERITONEUM AND RETROPERITONEUM: No ascites. No free air. VASCULATURE: Aorta is normal in caliber with atherosclerotic calcification. Nonocclusive thrombus in the right common femoral vein (series 2, image 75). Otherwise, no thrombus in the IVC, renal veins, iliac veins, or left femoral vein. Portal and mesenteric veins are patent without thrombus. No thrombus in the splenic vein. LYMPH NODES: No lymphadenopathy. REPRODUCTIVE ORGANS: No acute abnormality. BONES AND SOFT TISSUES: Chronic compression fracture of L1. No acute osseous abnormality. No focal soft tissue abnormality. IMPRESSION: 1. Nonocclusive thrombus in the right common femoral vein. No thrombus in the IVC or iliac veins . 2. 2 - 3 mm stone in the distal right ureter without right hydronephrosis. Nonobstructing left nephrolithiasis. 3. 10.2 cm cystic lesion in the pancreatic tail, previously evaluated on MRI 11/05/22; refer to that report for characterization and management recommendations. Electronically signed by: Norman Gatlin MD 06/02/2024 09:41 PM EST RP Workstation: [id number]   CT Angio Chest PE W and/or Wo Contrast Result Date: 06/02/2024 EXAM: CTA of the Chest without and with contrast for PE 06/02/2024 09:24:26 PM TECHNIQUE: CTA of the chest was performed without and with the administration of 125 mL of iohexol  (OMNIPAQUE ) 350 MG/ML injection. Multiplanar reformatted images are provided for review. MIP images are provided for review. Automated exposure control, iterative reconstruction, and/or weight based  adjustment of the mA/kV was utilized to reduce the radiation dose to as low as reasonably achievable. COMPARISON: Comparison with 08/05/2023 and same day chest radiograph. CLINICAL HISTORY: Pulmonary embolism (PE) suspected, high prob. FINDINGS: PULMONARY ARTERIES: Pulmonary arteries are adequately opacified for evaluation. Eccentric, nonocclusive filling defects in the right lower lobe pulmonary artery (series 5, image 170) are compatible with chronic thromboembolism. Dilated main pulmonary artery measuring 45 mm. MEDIASTINUM: The heart and pericardium demonstrate no acute abnormality. Coronary artery and aortic atherosclerotic calcification. Ascending aortic aneurysm measuring 51 mm. LYMPH NODES: No mediastinal, hilar or axillary lymphadenopathy. LUNGS AND PLEURA: The lungs are without acute process. No focal consolidation or pulmonary edema. No pleural effusion or pneumothorax. UPPER ABDOMEN: Limited images of the upper abdomen are unremarkable. SOFT TISSUES AND BONES: Chronic compression fracture of L1. No acute soft tissue abnormality. IMPRESSION: 1. 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 No evidence of acute pulmonary embolism. 2. Dilated main pulmonary artery measuring 45 mm, which may reflect pulmonary hypertension. 3. Ascending aortic aneurysm measuring 51 mm. Establish or continue care with cardiothoracic/vascular surgery for management and surveillance. Electronically signed by: Norman Gatlin MD 06/02/2024 09:33 PM EST RP Workstation: [id number]   US  Venous Img Lower Bilateral Result Date: 06/02/2024 CLINICAL DATA:  History of right DVT post thrombectomy in June, lower extremity swelling EXAM: BILATERAL LOWER EXTREMITY VENOUS DOPPLER ULTRASOUND TECHNIQUE: Gray-scale sonography with graded compression, as well as color Doppler and duplex ultrasound were performed to evaluate the lower extremity deep venous systems from the level of the common femoral vein and  including the common femoral, femoral, profunda femoral, popliteal and calf veins including the posterior tibial, peroneal and gastrocnemius veins when visible. The superficial great saphenous vein was also interrogated. Spectral Doppler was utilized to evaluate flow at rest and with distal augmentation maneuvers in the common femoral, femoral and popliteal veins. COMPARISON:  None Available. FINDINGS: RIGHT LOWER EXTREMITY Common Femoral Vein: Positive for nonocclusive thrombus. Incompletely compressible. Saphenofemoral Junction: No evidence of thrombus. Normal compressibility and flow on color Doppler imaging. Profunda Femoral Vein: No evidence of thrombus. Normal compressibility and flow on  color Doppler imaging. Femoral Vein: Positive for small volume nonocclusive thrombus. Incompletely compressible. Popliteal Vein: Positive for nonocclusive thrombus. Incompletely compressible. Calf Veins: Poorly visible. Superficial Great Saphenous Vein: No evidence of thrombus. Normal compressibility. Other Findings:  Edema within the subcutaneous soft tissues LEFT LOWER EXTREMITY Common Femoral Vein: No evidence of thrombus. Normal compressibility, respiratory phasicity and response to augmentation. Saphenofemoral Junction: No evidence of thrombus. Normal compressibility and flow on color Doppler imaging. Profunda Femoral Vein: No evidence of thrombus. Normal compressibility and flow on color Doppler imaging. Femoral Vein: Positive for nonocclusive thrombus. Incompletely compressible. Popliteal Vein: No evidence of thrombus. Normal compressibility, respiratory phasicity and response to augmentation. Calf Veins: Poorly visualized Superficial Great Saphenous Vein: No evidence of thrombus. Normal compressibility. Other Findings:  Edema within the subcutaneous soft tissues IMPRESSION: 1. Positive for nonocclusive DVT within the right common femoral, femoral and popliteal veins. 2. No evidence for left lower extremity DVT.  Electronically Signed   By: Luke Bun M.D.   On: 06/02/2024 20:28   DG Chest Portable 1 View Result Date: 06/02/2024 EXAM: 1 VIEW(S) XRAY OF THE CHEST 06/02/2024 06:33:00 PM COMPARISON: 03/23/2024 CLINICAL HISTORY: sob FINDINGS: LUNGS AND PLEURA: No focal pulmonary opacity. No pulmonary edema. No pleural effusion. No pneumothorax. HEART AND MEDIASTINUM: Cardiomegaly, unchanged. Calcified aorta. BONES AND SOFT TISSUES: No acute osseous abnormality. IMPRESSION: 1. No acute cardiopulmonary process. 2. Cardiomegaly, unchanged from prior. Electronically signed by: Norman Gatlin MD 06/02/2024 07:19 PM EST RP Workstation: [id number]     Procedures   Medications Ordered in the ED  carvedilol  (COREG ) tablet 6.25 mg (6.25 mg Oral Given 06/04/24 0734)  rosuvastatin  (CRESTOR ) tablet 10 mg (10 mg Oral Given 06/04/24 0930)  buPROPion  (WELLBUTRIN  XL) 24 hr tablet 150 mg (150 mg Oral Given 06/04/24 0930)  DULoxetine  (CYMBALTA ) DR capsule 60 mg (60 mg Oral Given 06/04/24 0930)  apixaban  (ELIQUIS ) tablet 5 mg (5 mg Oral Given 06/04/24 0929)  tiZANidine  (ZANAFLEX ) tablet 4 mg (4 mg Oral Given 06/04/24 0734)  potassium chloride  SA (KLOR-CON  M) CR tablet 20 mEq (20 mEq Oral Given 06/04/24 0930)  sodium chloride  flush (NS) 0.9 % injection 3 mL (3 mLs Intravenous Given 06/04/24 0930)  hydrALAZINE  (APRESOLINE ) tablet 50 mg (50 mg Oral Given 06/03/24 0320)  acetaminophen  (TYLENOL ) tablet 650 mg (has no administration in time range)    Or  acetaminophen  (TYLENOL ) suppository 650 mg (has no administration in time range)  oxyCODONE  (Oxy IR/ROXICODONE ) immediate release tablet 5 mg (5 mg Oral Given 06/04/24 0734)  HYDROmorphone  (DILAUDID ) injection 0.5 mg (0.5 mg Intravenous Given 06/03/24 2004)  senna-docusate (Senokot-S) tablet 1 tablet (has no administration in time range)  ondansetron  (ZOFRAN ) tablet 4 mg ( Oral See Alternative 06/03/24 1345)    Or  ondansetron  (ZOFRAN ) injection 4 mg (4 mg Intravenous Given 06/03/24  1345)  melatonin tablet 3 mg (3 mg Oral Given 06/03/24 2147)  perflutren lipid microspheres (DEFINITY) IV suspension (2 mLs Intravenous Given 06/03/24 1107)  furosemide (LASIX) tablet 40 mg (40 mg Oral Given 06/04/24 0930)  labetalol  (NORMODYNE ) injection 10 mg (10 mg Intravenous Given 06/02/24 1907)  potassium chloride  SA (KLOR-CON  M) CR tablet 40 mEq (40 mEq Oral Given 06/02/24 2001)  furosemide (LASIX) injection 40 mg (40 mg Intravenous Given 06/02/24 2002)  labetalol  (NORMODYNE ) injection 20 mg (20 mg Intravenous Given 06/02/24 2056)  iohexol  (OMNIPAQUE ) 350 MG/ML injection 125 mL (125 mLs Intravenous Contrast Given 06/02/24 2115)  hydrALAZINE  (APRESOLINE ) tablet 25 mg (25 mg Oral Given 06/02/24 2151)  methocarbamol  (ROBAXIN )  tablet 1,000 mg (1,000 mg Oral Given 06/03/24 0033)    Clinical Course as of 06/04/24 1016  Fri Jun 02, 2024  2230 Dr Kenard from hospitalist consulted for admission.  [RP]    Clinical Course User Index [RP] Yolande Lamar BROCKS, MD                                 Medical Decision Making Amount and/or Complexity of Data Reviewed Labs: ordered. Radiology: ordered.  Risk Prescription drug management. Decision regarding hospitalization.   68 year old male with a history of DVT status post thrombectomy on Eliquis  as well as hypertension who presents to the emergency department with leg swelling.  Initial Ddx:  CHF, hypertensive emergency, uncontrolled hypertension, MI, PE, IVC clot, DVT  MDM/Course:  Patient presents to the emergency department with bilateral lower extremity swelling.  Was also telling urgent care that he was short of breath but denies this to me.  Urgent care was somewhat concerned about possible IVC blood clot or PE.  On exam is markedly hypertensive with a systolic blood pressure above 799.  Does have lower extremity swelling bilaterally.  I do not appreciate any significant Rales and he satting well on room air.  Had a lower extremity ultrasound  that shows persistent but old blood clot.  He underwent a CTA of the chest and venogram of the abdomen pelvis that did not show any evidence of proximal blood clot.  Did show an old PE but I do not suspect that is causing his symptoms.  He was given blood pressure medication and upon re-evaluation blood pressure improved to the 170s and 180s.  Suspect that his uncontrolled hypertension likely is leading to heart failure with his bilateral lower extremity swelling.  His BNP was also markedly elevated at 2200 and troponin mildly elevated but flat.  Suspect that he is having signs of heart failure from hypertensive emergency and so will admit to hospitalist for blood pressure control and echo.  This patient presents to the ED for concern of complaints listed in HPI, this involves an extensive number of treatment options, and is a complaint that carries with it a high risk of complications and morbidity. Disposition including potential need for admission considered.   Dispo: Admit to Floor  Additional history obtained from urgent care Records reviewed Outpatient Clinic Notes The following labs were independently interpreted: Chemistry and show no acute abnormality I independently reviewed the following imaging with scope of interpretation limited to determining acute life threatening conditions related to emergency care: Chest x-ray and agree with the radiologist interpretation with the following exceptions: none I personally reviewed and interpreted cardiac monitoring: normal sinus rhythm  I personally reviewed and interpreted the pt's EKG: see above for interpretation  I have reviewed the patients home medications and made adjustments as needed Consults: Hospitalist  CRITICAL CARE Performed by: Lamar BROCKS Yolande   Total critical care time: 30 minutes  Critical care time was exclusive of separately billable procedures and treating other patients.  Critical care was necessary to treat or prevent  imminent or life-threatening deterioration.  Critical care was time spent personally by me on the following activities: development of treatment plan with patient and/or surrogate as well as nursing, discussions with consultants, evaluation of patient's response to treatment, examination of patient, obtaining history from patient or surrogate, ordering and performing treatments and interventions, ordering and review of laboratory studies, ordering and review of radiographic studies,  pulse oximetry and re-evaluation of patient's condition.   Portions of this note were generated with Scientist, clinical (histocompatibility and immunogenetics). Dictation errors may occur despite best attempts at proofreading.     Final diagnoses:  Hypertensive emergency  Acute congestive heart failure, unspecified heart failure type [facility])    ED Discharge Orders     None          Yolande Lamar BROCKS, MD 06/04/24 1016

## 2024-06-02 NOTE — Progress Notes (Signed)
 Plan of Care Note for accepted transfer  Patient: Henry Figueroa    FMW:[id number]  DOA: 06/02/2024     Facility requesting transfer: [facility] Requesting Provider: Dr. 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  Patient did not exhibit any oxygen requirements, chest pain or significant shortness of breath while in the emergency department however the emergency department provider felt the patient was suffering from substantial volume overload likely secondary to hypertensive cardiomyopathy due to uncontrolled blood pressure with a degree of acute congestive heart failure.  40 mg of intravenous Lasix was administered as well as 10 mg of intravenous labetalol  followed by 20 mg of intravenous labetalol  followed by 25 mg of oral hydralazine  with some improvement of blood pressure with systolics in the 170s.  The hospitalist group was then called and patient will be accepted to either [facility] or [facility] telemetry unit based on bed availability.  Plan of care: The patient is accepted for admission to Telemetry unit, at [facility] or [facility] based on bed availability.   Author: Zachary JINNY Ba, MD  06/02/2024  Check [email or web address] for on-call coverage.  Nursing staff, Please call [facility] Admits & Consults System-Wide number on Amion as soon as  patient's arrival, so appropriate admitting provider can evaluate the pt.

## 2024-06-02 NOTE — ED Triage Notes (Addendum)
 Pt has had right leg swelling for about 6 month due to dvt. He has been taking eliquis  for DVT.   Today he was out walking and began to have increase swelling and discomfort in bilateral legs.   Denies chest pain or increased SOB
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
## 2024-06-02 NOTE — ED Triage Notes (Signed)
 Pt c/o BLE swelling first noticed this afternoon after walking, noticed some SHOB x2-3 days. Reports he is compliant w eliquis  r/t DVT in R leg. Denies CP, denies injury.

## 2024-06-02 NOTE — Discharge Instructions (Addendum)
 After extensively reviewing the past medical history and reviewing the presenting symptoms, we recommend further evaluation at the emergency department where more extensive workup can be obtained.

## 2024-06-02 NOTE — Telephone Encounter (Signed)
 FYI Only or Action Required?: FYI only for provider: UC advised .  Patient was last seen in primary care on 03/23/2024 by Joshua Debby CROME, MD.  Called Nurse Triage reporting Foot Swelling.  Symptoms began today.  Interventions attempted: Nothing.  Symptoms are: gradually worsening.  Triage Disposition: See Physician Within 24 Hours  Patient/caregiver understands and will follow disposition?: Yes  Copied from CRM #[id number]. Topic: Clinical - Red Word Triage >> Jun 02, 2024  3:53 PM Alexandria E wrote: [facility] that prompted transfer to Nurse Triage: Foot and ankle swelling, started at some point today. Feet are starting to become painful. Reason for Disposition  SEVERE ankle swelling (e.g., can't move swollen ankle at all)  Answer Assessment - Initial Assessment Questions 1. LOCATION: Which ankle is swollen? Where is the swelling?     Today- both feet/ankles are swollen and sore 2. ONSET: When did the swelling start?     today 3. SWELLING: How bad is the swelling? Or, How large is it? (e.g., mild, moderate, severe; size of localized swelling)      severe 4. PAIN: Is there any pain? If Yes, ask: How bad is it? (Scale 0-10; or none, mild, moderate, severe)     Tightness, uncomfortable 5. CAUSE: What do you think caused the ankle swelling?     Not sure - hx blood clot 6. OTHER SYMPTOMS: Do you have any other symptoms? (e.g., fever, chest pain, difficulty breathing, calf pain)     no  Protocols used: Ankle Swelling-A-AH

## 2024-06-03 ENCOUNTER — Observation Stay (HOSPITAL_COMMUNITY)

## 2024-06-03 ENCOUNTER — Other Ambulatory Visit: Payer: Self-pay

## 2024-06-03 DIAGNOSIS — G4733 Obstructive sleep apnea (adult) (pediatric): Secondary | ICD-10-CM | POA: Insufficient documentation

## 2024-06-03 DIAGNOSIS — I160 Hypertensive urgency: Secondary | ICD-10-CM | POA: Diagnosis present

## 2024-06-03 DIAGNOSIS — F32A Depression, unspecified: Secondary | ICD-10-CM | POA: Diagnosis present

## 2024-06-03 DIAGNOSIS — I10 Essential (primary) hypertension: Secondary | ICD-10-CM | POA: Diagnosis not present

## 2024-06-03 DIAGNOSIS — I7121 Aneurysm of the ascending aorta, without rupture: Secondary | ICD-10-CM | POA: Diagnosis present

## 2024-06-03 DIAGNOSIS — I5031 Acute diastolic (congestive) heart failure: Secondary | ICD-10-CM | POA: Diagnosis not present

## 2024-06-03 DIAGNOSIS — I509 Heart failure, unspecified: Secondary | ICD-10-CM | POA: Diagnosis present

## 2024-06-03 DIAGNOSIS — Z743 Need for continuous supervision: Secondary | ICD-10-CM | POA: Diagnosis not present

## 2024-06-03 DIAGNOSIS — I2782 Chronic pulmonary embolism: Secondary | ICD-10-CM | POA: Diagnosis present

## 2024-06-03 DIAGNOSIS — R2240 Localized swelling, mass and lump, unspecified lower limb: Secondary | ICD-10-CM | POA: Diagnosis not present

## 2024-06-03 LAB — BASIC METABOLIC PANEL WITH GFR
Anion gap: 15 (ref 5–15)
BUN: 15 mg/dL (ref 8–23)
CO2: 25 mmol/L (ref 22–32)
Calcium: 9.6 mg/dL (ref 8.9–10.3)
Chloride: 102 mmol/L (ref 98–111)
Creatinine, Ser: 0.98 mg/dL (ref 0.61–1.24)
GFR, Estimated: >60 mL/min (ref >60)
Glucose, Bld: 111 mg/dL — ABNORMAL HIGH (ref 70–99)
Potassium: 3.1 mmol/L — ABNORMAL LOW (ref 3.5–5.1)
Sodium: 142 mmol/L (ref 135–145)

## 2024-06-03 LAB — CBC
HCT: 40.5 % (ref 39.0–52.0)
Hemoglobin: 13.7 g/dL (ref 13.0–17.0)
MCH: 32.8 pg (ref 26.0–34.0)
MCHC: 33.8 g/dL (ref 30.0–36.0)
MCV: 96.9 fL (ref 80.0–100.0)
Platelets: 228 K/uL (ref 150–400)
RBC: 4.18 MIL/uL — ABNORMAL LOW (ref 4.22–5.81)
RDW: 12.4 % (ref 11.5–15.5)
WBC: 9.4 K/uL (ref 4.0–10.5)
nRBC: 0 % (ref 0.0–0.2)

## 2024-06-03 LAB — ECHOCARDIOGRAM COMPLETE
AR max vel: 2.39 cm2
AV Area VTI: 2.36 cm2
AV Area mean vel: 2.69 cm2
AV Mean grad: 6 mmHg
AV Peak grad: 9.6 mmHg
Ao pk vel: 1.55 m/s
Area-P 1/2: 4.04 cm2
Height: 72 in
P 1/2 time: 667 ms
S' Lateral: 3.9 cm
Weight: 3820.8 [oz_av]

## 2024-06-03 LAB — HIV ANTIBODY (ROUTINE TESTING W REFLEX): HIV Screen 4th Generation wRfx: NONREACTIVE

## 2024-06-03 MED ORDER — ONDANSETRON HCL 4 MG PO TABS: 4.0000 mg | ORAL_TABLET | Freq: Four times a day (QID) | ORAL | Status: DC | PRN

## 2024-06-03 MED ORDER — APIXABAN 5 MG PO TABS
5.0000 mg | ORAL_TABLET | Freq: Two times a day (BID) | ORAL | Status: DC
  Administered 2024-06-03 – 2024-06-04 (×4): 5 mg via ORAL
  Filled 2024-06-03 (×4): qty 1

## 2024-06-03 MED ORDER — FUROSEMIDE 40 MG PO TABS
40.0000 mg | ORAL_TABLET | Freq: Every day | ORAL | Status: DC
  Administered 2024-06-04: 40 mg via ORAL
  Filled 2024-06-03: qty 1

## 2024-06-03 MED ORDER — HYDROMORPHONE HCL 1 MG/ML IJ SOLN
0.5000 mg | INTRAMUSCULAR | Status: DC | PRN
  Administered 2024-06-03: 0.5 mg via INTRAVENOUS
  Filled 2024-06-03: qty 0.5

## 2024-06-03 MED ORDER — METHOCARBAMOL 500 MG PO TABS
1000.0000 mg | ORAL_TABLET | Freq: Once | ORAL | Status: AC
  Administered 2024-06-03: 1000 mg via ORAL
  Filled 2024-06-03: qty 2

## 2024-06-03 MED ORDER — MELATONIN 3 MG PO TABS
3.0000 mg | ORAL_TABLET | Freq: Every evening | ORAL | Status: DC | PRN
Start: 2024-06-03 — End: 2024-06-04
  Administered 2024-06-03 (×2): 3 mg via ORAL
  Filled 2024-06-03 (×2): qty 1

## 2024-06-03 MED ORDER — OXYCODONE HCL 5 MG PO TABS
5.0000 mg | ORAL_TABLET | ORAL | Status: DC | PRN
  Administered 2024-06-03 – 2024-06-04 (×2): 5 mg via ORAL
  Filled 2024-06-03 (×2): qty 1

## 2024-06-03 MED ORDER — BUPROPION HCL ER (XL) 150 MG PO TB24
150.0000 mg | ORAL_TABLET | Freq: Every day | ORAL | Status: DC
  Administered 2024-06-03 – 2024-06-04 (×2): 150 mg via ORAL
  Filled 2024-06-03 (×2): qty 1

## 2024-06-03 MED ORDER — SENNOSIDES-DOCUSATE SODIUM 8.6-50 MG PO TABS: 1.0000 | ORAL_TABLET | Freq: Every evening | ORAL | Status: DC | PRN

## 2024-06-03 MED ORDER — SODIUM CHLORIDE 0.9% FLUSH
3.0000 mL | Freq: Two times a day (BID) | INTRAVENOUS | Status: DC
  Administered 2024-06-03 – 2024-06-04 (×4): 3 mL via INTRAVENOUS

## 2024-06-03 MED ORDER — ACETAMINOPHEN 650 MG RE SUPP: 650.0000 mg | Freq: Four times a day (QID) | RECTAL | Status: DC | PRN

## 2024-06-03 MED ORDER — ACETAMINOPHEN 325 MG PO TABS: 650.0000 mg | ORAL_TABLET | Freq: Four times a day (QID) | ORAL | Status: DC | PRN

## 2024-06-03 MED ORDER — ONDANSETRON HCL 4 MG/2ML IJ SOLN
4.0000 mg | Freq: Four times a day (QID) | INTRAMUSCULAR | Status: DC | PRN
  Administered 2024-06-03: 4 mg via INTRAVENOUS
  Filled 2024-06-03: qty 2

## 2024-06-03 MED ORDER — TIZANIDINE HCL 4 MG PO TABS
4.0000 mg | ORAL_TABLET | Freq: Four times a day (QID) | ORAL | Status: DC | PRN
Start: 2024-06-03 — End: 2024-06-04
  Administered 2024-06-04: 4 mg via ORAL
  Filled 2024-06-03: qty 1

## 2024-06-03 MED ORDER — CARVEDILOL 6.25 MG PO TABS
6.2500 mg | ORAL_TABLET | Freq: Two times a day (BID) | ORAL | Status: DC
  Administered 2024-06-03 – 2024-06-04 (×3): 6.25 mg via ORAL
  Filled 2024-06-03 (×3): qty 1

## 2024-06-03 MED ORDER — FUROSEMIDE 10 MG/ML IJ SOLN
40.0000 mg | Freq: Two times a day (BID) | INTRAMUSCULAR | Status: DC
  Administered 2024-06-03: 40 mg via INTRAVENOUS
  Filled 2024-06-03: qty 4

## 2024-06-03 MED ORDER — HYDRALAZINE HCL 50 MG PO TABS
50.0000 mg | ORAL_TABLET | Freq: Four times a day (QID) | ORAL | Status: DC | PRN
  Administered 2024-06-03: 50 mg via ORAL
  Filled 2024-06-03: qty 1

## 2024-06-03 MED ORDER — ROSUVASTATIN CALCIUM 10 MG PO TABS
10.0000 mg | ORAL_TABLET | Freq: Every day | ORAL | Status: DC
  Administered 2024-06-03 – 2024-06-04 (×2): 10 mg via ORAL
  Filled 2024-06-03 (×2): qty 1

## 2024-06-03 MED ORDER — DULOXETINE HCL 60 MG PO CPEP
60.0000 mg | ORAL_CAPSULE | Freq: Every day | ORAL | Status: DC
  Administered 2024-06-03 – 2024-06-04 (×2): 60 mg via ORAL
  Filled 2024-06-03 (×2): qty 1

## 2024-06-03 MED ORDER — PERFLUTREN LIPID MICROSPHERE
1.0000 mL | INTRAVENOUS | Status: AC | PRN
  Administered 2024-06-03: 2 mL via INTRAVENOUS

## 2024-06-03 MED ORDER — POTASSIUM CHLORIDE CRYS ER 20 MEQ PO TBCR
20.0000 meq | EXTENDED_RELEASE_TABLET | Freq: Every day | ORAL | Status: DC
  Administered 2024-06-03 – 2024-06-04 (×2): 20 meq via ORAL
  Filled 2024-06-03 (×2): qty 1

## 2024-06-03 NOTE — Care Management Obs Status (Signed)
 MEDICARE OBSERVATION STATUS NOTIFICATION   Patient Details  Name: Henry Figueroa MRN: [id number] Date of Birth: 1955-10-27   Medicare Observation Status Notification Given:  Yes    Sonda Manuella Quill, RN 06/03/2024, 6:20 PM

## 2024-06-03 NOTE — Progress Notes (Signed)
 PROGRESS NOTE    Henry Figueroa  FMW:[id number] DOB: 1955-11-06 DOA: 06/02/2024 PCP: Joshua Debby CROME, MD   Brief Narrative:   68 y.o. 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 Patient reported 2 to 3 days of progressive exertional dyspnea and then noticed bilateral lower leg swelling. 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 CTA chest reveals chronic nonocclusive PE in the right lower lobe without acute PE. Also noted on CT is ascending aortic aneurysm measuring 51 mm.  Patient was found to have elevated BNP.  Echo has been ordered to rule out congestive heart failure.  Patient denies any history of coronary artery disease or congestive heart failure.    Assessment & Plan:  Principal Problem:   Acute CHF (congestive heart failure) (HCC) Active Problems:   Hypokalemia   Chronic deep vein thrombosis (DVT) (HCC)   Chronic pulmonary embolism (HCC)   Depression   Hypertensive urgency   Ascending aortic aneurysm   Acute possible new onset diastolic CHF, POA: - Presents with bilateral LE edema and DOE and found to have Pro-BNP 2303  - Changed intravenous Lasix to p.o. Lasix 40 mg daily with close monitoring of renal function as well as electrolytes including potassium and magnesium.  Follow-up echocardiogram.  Fluid restriction of 1.5 L/day, Daily weight and strict intake output monitoring. -Rest of management will depend on the results of the echocardiogram   Hypertensive urgency: Resolved no - Asymptomatic  - Continue diuresis, continue Coreg , use hydralazine  as needed     Hx of RLE DVT and PE  - No acute DVT or PE on imaging in ED  - Continue Eliquis       Hypokalemia  - As needed repletion   Major moderate depression  - Continue Wellbutrin  and Cymbalta     Ascending aortic aneurysm  - Increased to 51 mm on CT in ED  - Patient aware; this has been followed by cardiology    Hx of atrial flutter s/p ablation  - In SR     OSA  - Unable to tolerate CPAP, he is looking into other treatment modalities     Disposition: Home.  Patient is independent in activities of daily life  DVT prophylaxis:  apixaban  (ELIQUIS ) tablet 5 mg     Code Status: Full Code Family Communication: None at the bedside Status is: Observation The patient remains OBS appropriate and will d/c before 2 midnights.    Subjective:  Denies chest pain or shortness of breath.  He said that he does take 3 different blood pressures at home and checks his blood pressure almost once a week.  He is independent in activities of daily life.  We talked about getting an echo to see if he truly has congestive heart failure or not.  He is complaining of bilateral lower extremity edema.  Examination:  General exam: Appears calm and comfortable  Respiratory system: Clear to auscultation. Respiratory effort normal. Cardiovascular system: S1 & S2 heard, RRR. No JVD, murmurs, rubs, gallops or clicks.  Bilateral 2+ lower extremity pitting pedal edema. Gastrointestinal system: Abdomen is nondistended, soft and nontender. No organomegaly or masses felt. Normal bowel sounds heard. Central nervous system: Alert and oriented. No focal neurological deficits. Extremities: Symmetric  5 x 5 power. Skin: No rashes, lesions or ulcers Psychiatry: Judgement and insight appear normal. Mood & affect appropriate.       Diet Orders (From admission, onward)     Start     Ordered   06/03/24 0246  Diet Heart Room service appropriate? Yes; Fluid consistency: Thin  Diet effective now       Question Answer Comment  Room service appropriate? Yes   Fluid consistency: Thin      06/03/24 0247             Objective: Vitals:   06/02/24 2200 06/02/24 2230 06/03/24 0152 06/03/24 0320  BP: (!) 186/113 (!) 173/100 (!) 194/129 (!) 194/129  Pulse: 81 85 93   Resp: (!) 27 (!) 31 18   Temp:   98.2 F (36.8 C)   TempSrc:   Oral   SpO2: 94% 92% 97%   Weight:   108.3 kg   Height:   6' (1.829 m)     Intake/Output Summary (Last 24 hours) at 06/03/2024 1111 Last data filed at 06/03/2024 1034 Gross per 24 hour  Intake --  Output 2525 ml  Net -2525 ml   Filed Weights   06/03/24 0152  Weight: 108.3 kg    Scheduled Meds:  apixaban   5 mg Oral BID   buPROPion   150 mg Oral Daily   carvedilol   6.25 mg Oral BID WC   DULoxetine   60 mg Oral Daily   furosemide  40 mg Intravenous Q12H   potassium chloride   20 mEq Oral Daily   rosuvastatin   10 mg Oral Daily   sodium chloride  flush  3 mL Intravenous Q12H   Continuous Infusions:  Nutritional status     Body mass index is 32.39 kg/m.  Data Reviewed:   CBC: Recent Labs  Lab 06/02/24 1840 06/03/24 0452  WBC 6.5 9.4  HGB 13.1 13.7  HCT 38.0* 40.5  MCV 95.7 96.9  PLT 220 228   Basic Metabolic Panel: Recent Labs  Lab 06/02/24 1840 06/03/24 0452  NA 141 142  K 3.2* 3.1*  CL 103 102  CO2 27 25  GLUCOSE 111* 111*  BUN 20 15  CREATININE 1.08 0.98  CALCIUM  10.0 9.6   GFR: Estimated Creatinine Clearance: 90.5 mL/min (by C-G formula based on SCr of 0.98 mg/dL). Liver Function Tests: Recent Labs  Lab 06/02/24 1840  AST 19  ALT 11  ALKPHOS 52  BILITOT 0.5  PROT 7.5  ALBUMIN  4.8   No results for input(s): LIPASE, AMYLASE in the last 168 hours. No results for input(s): AMMONIA in the last 168 hours. Coagulation Profile: No results for input(s): INR, PROTIME in the last 168 hours. Cardiac Enzymes: No results for input(s): CKTOTAL, CKMB, CKMBINDEX, TROPONINI in the last 168 hours. BNP (last 3 results) Recent Labs    03/23/24 1109 06/02/24 1840  PROBNP 44.0 2,203.0*   HbA1C: No results  for input(s): HGBA1C in the last 72 hours. CBG: No results for input(s): GLUCAP in the last 168 hours. Lipid Profile: No results for input(s): CHOL, HDL, LDLCALC, TRIG, CHOLHDL, LDLDIRECT in the last 72 hours. Thyroid  Function Tests: No results for input(s): TSH, T4TOTAL, FREET4, T3FREE, THYROIDAB in the last 72 hours. Anemia Panel: No results for input(s): VITAMINB12, FOLATE, FERRITIN, TIBC, IRON, RETICCTPCT in the last 72 hours. Sepsis Labs: No results for input(s): PROCALCITON, LATICACIDVEN in the last 168 hours.  No results found for this or any previous visit (from the past 240 hours).  Radiology Studies: CT VENOGRAM ABD/PEL Result Date: 06/02/2024 EXAM: CT ABDOMEN AND PELVIS WITH CONTRAST 06/02/2024 09:24:26 PM TECHNIQUE: CT of the abdomen and pelvis was performed with the administration of 125 mL of iohexol  (OMNIPAQUE ) 350 MG/ML injection. Multiplanar reformatted images are provided for review. Automated exposure control, iterative reconstruction, and/or weight-based adjustment of the mA/kV was utilized to reduce the radiation dose to as low as reasonably achievable. COMPARISON: MRI abdomen 11/05/2022 and same day lower extremity ultrasound . CLINICAL HISTORY: possible IVC clot FINDINGS: LOWER CHEST: No acute abnormality. LIVER: The liver is unremarkable. GALLBLADDER AND BILE DUCTS: Cholecystectomy. No biliary ductal dilatation. SPLEEN: No acute abnormality. PANCREAS: 10.2 cm cystic lesion in the tail of the pancreas previously evaluated with MRI 11/05/2022. See that report for details and recommendations. ADRENAL GLANDS: No acute abnormality. KIDNEYS, URETERS AND BLADDER: Nonobstructing left nephrolithiasis. 3 mm stone in the distal right ureter. No hydronephrosis. Symmetric nephrograms. No perinephric or periureteral stranding. Urinary bladder is unremarkable. GI AND BOWEL: Stomach demonstrates no acute abnormality. Normal appendix. There is  no bowel obstruction. PERITONEUM AND RETROPERITONEUM: No ascites. No free air. VASCULATURE: Aorta is normal in caliber with atherosclerotic calcification. Nonocclusive thrombus in the right common femoral vein (series 2, image 75). Otherwise, no thrombus in the IVC, renal veins, iliac veins, or left femoral vein. Portal and mesenteric veins are patent without thrombus. No thrombus in the splenic vein. LYMPH NODES: No lymphadenopathy. REPRODUCTIVE ORGANS: No acute abnormality. BONES AND SOFT TISSUES: Chronic compression fracture of L1. No acute osseous abnormality. No focal soft tissue abnormality. IMPRESSION: 1. Nonocclusive thrombus in the right common femoral vein. No thrombus in the IVC or iliac veins . 2. 2 - 3 mm stone in the distal right ureter without right hydronephrosis. Nonobstructing left nephrolithiasis. 3. 10.2 cm cystic lesion in the pancreatic tail, previously evaluated on MRI 11/05/22; refer to that report for characterization and management recommendations. Electronically signed by: Norman Gatlin MD 06/02/2024 09:41 PM EST RP Workstation: [id number]   CT Angio Chest PE W and/or Wo Contrast Result Date: 06/02/2024 EXAM: CTA of the Chest without and with contrast for PE 06/02/2024 09:24:26 PM TECHNIQUE: CTA of the chest was performed without and with the administration of 125 mL of iohexol  (OMNIPAQUE ) 350 MG/ML injection. Multiplanar reformatted images are provided for review. MIP images are provided for review. Automated exposure control, iterative reconstruction, and/or weight based adjustment of the mA/kV was utilized to reduce the radiation dose to as low as reasonably achievable. COMPARISON: Comparison with 08/05/2023 and same day chest radiograph. CLINICAL HISTORY: Pulmonary embolism (PE) suspected, high prob. FINDINGS: PULMONARY ARTERIES: Pulmonary arteries are adequately opacified for evaluation. Eccentric, nonocclusive filling defects in the right lower lobe pulmonary artery (series 5,  image 170) are compatible with chronic thromboembolism. Dilated main pulmonary artery measuring 45 mm. MEDIASTINUM: The heart and pericardium demonstrate no acute abnormality. Coronary artery and aortic atherosclerotic calcification. Ascending aortic aneurysm measuring 51 mm. LYMPH NODES: No mediastinal, hilar or axillary lymphadenopathy. LUNGS AND PLEURA: The lungs are without acute process. No focal consolidation or pulmonary edema. No pleural effusion or pneumothorax. UPPER ABDOMEN: Limited images of the upper abdomen are unremarkable. SOFT TISSUES AND BONES: Chronic compression fracture of L1. No acute soft tissue abnormality. IMPRESSION: 1. 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 No evidence of acute pulmonary embolism. 2. Dilated main pulmonary artery measuring 45 mm, which may reflect pulmonary hypertension. 3. Ascending aortic aneurysm measuring 51 mm. Establish or continue care with cardiothoracic/vascular surgery for management and  surveillance. Electronically signed by: Norman Gatlin MD 06/02/2024 09:33 PM EST RP Workstation: [id number]   US  Venous Img Lower Bilateral Result Date: 06/02/2024 CLINICAL DATA:  History of right DVT post thrombectomy in June, lower extremity swelling EXAM: BILATERAL LOWER EXTREMITY VENOUS DOPPLER ULTRASOUND TECHNIQUE: Gray-scale sonography with graded compression, as well as color Doppler and duplex ultrasound were performed to evaluate the lower extremity deep venous systems from the level of the common femoral vein and including the common femoral, femoral, profunda femoral, popliteal and calf veins including the posterior tibial, peroneal and gastrocnemius veins when visible. The superficial great saphenous vein was also interrogated. Spectral Doppler was utilized to evaluate flow at rest and with distal augmentation maneuvers in the common femoral, femoral and popliteal veins. COMPARISON:  None Available. FINDINGS: RIGHT LOWER  EXTREMITY Common Femoral Vein: Positive for nonocclusive thrombus. Incompletely compressible. Saphenofemoral Junction: No evidence of thrombus. Normal compressibility and flow on color Doppler imaging. Profunda Femoral Vein: No evidence of thrombus. Normal compressibility and flow on color Doppler imaging. Femoral Vein: Positive for small volume nonocclusive thrombus. Incompletely compressible. Popliteal Vein: Positive for nonocclusive thrombus. Incompletely compressible. Calf Veins: Poorly visible. Superficial Great Saphenous Vein: No evidence of thrombus. Normal compressibility. Other Findings:  Edema within the subcutaneous soft tissues LEFT LOWER EXTREMITY Common Femoral Vein: No evidence of thrombus. Normal compressibility, respiratory phasicity and response to augmentation. Saphenofemoral Junction: No evidence of thrombus. Normal compressibility and flow on color Doppler imaging. Profunda Femoral Vein: No evidence of thrombus. Normal compressibility and flow on color Doppler imaging. Femoral Vein: Positive for nonocclusive thrombus. Incompletely compressible. Popliteal Vein: No evidence of thrombus. Normal compressibility, respiratory phasicity and response to augmentation. Calf Veins: Poorly visualized Superficial Great Saphenous Vein: No evidence of thrombus. Normal compressibility. Other Findings:  Edema within the subcutaneous soft tissues IMPRESSION: 1. Positive for nonocclusive DVT within the right common femoral, femoral and popliteal veins. 2. No evidence for left lower extremity DVT. Electronically Signed   By: Luke Bun M.D.   On: 06/02/2024 20:28   DG Chest Portable 1 View Result Date: 06/02/2024 EXAM: 1 VIEW(S) XRAY OF THE CHEST 06/02/2024 06:33:00 PM COMPARISON: 03/23/2024 CLINICAL HISTORY: sob FINDINGS: LUNGS AND PLEURA: No focal pulmonary opacity. No pulmonary edema. No pleural effusion. No pneumothorax. HEART AND MEDIASTINUM: Cardiomegaly, unchanged. Calcified aorta. BONES AND SOFT  TISSUES: No acute osseous abnormality. IMPRESSION: 1. No acute cardiopulmonary process. 2. Cardiomegaly, unchanged from prior. Electronically signed by: Norman Gatlin MD 06/02/2024 07:19 PM EST RP Workstation: [id number]           LOS: 0 days   Time spent= 35 mins    Deliliah Room, MD [facility]  If 7PM-7AM, please contact night-coverage  06/03/2024, 11:11 AM
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
## 2024-06-03 NOTE — TOC Initial Note (Signed)
 Transition of Care [facility]) - Initial/Assessment Note    Patient Details  Name: Henry Figueroa MRN: [id number] Date of Birth: 08-19-1955  Transition of Care [facility]) CM/SW Contact:    Sonda Manuella Quill, RN Phone Number: 06/03/2024, 6:24 PM  Clinical Narrative:                 [place] w/ pt in room; pt said he lives at home; he plans to return w/ family support at d/c; pt identified POC sister Henry Figueroa ([phone]); family will provide transportation; pt verified insurance/PCP; he denied SDOH risks; pt has walking stick and walker; he does not have HH services or home oxygen; IP CM will follow.  Expected Discharge Plan: Home/Self Care Barriers to Discharge: Continued Medical Work up   Patient Goals and CMS Choice Patient states their goals for this hospitalization and ongoing recovery are:: home          Expected Discharge Plan and Services   Discharge Planning Services: CM Consult   Living arrangements for the past 2 months: Single Family Home                 DME Arranged: N/A DME Agency: NA       HH Arranged: NA HH Agency: NA        Prior Living Arrangements/Services Living arrangements for the past 2 months: Single Family Home Lives with:: Self Patient language and need for interpreter reviewed:: Yes Do you feel safe going back to the place where you live?: Yes      Need for Family Participation in Patient Care: Yes (Comment) Care giver support system in place?: Yes (comment) Current home services: DME (walking stick, walker) Criminal Activity/Legal Involvement Pertinent to Current Situation/Hospitalization: No - Comment as needed  Activities of Daily Living   ADL Screening (condition at time of admission) Independently performs ADLs?: Yes (appropriate for developmental age) Is the patient deaf or have difficulty hearing?: No Does the patient have difficulty seeing, even when wearing glasses/contacts?: No Does the patient have difficulty concentrating,  remembering, or making decisions?: No  Permission Sought/Granted Permission sought to share information with : Case Manager Permission granted to share information with : Yes, Verbal Permission Granted  Share Information with NAME: Case Manager     Permission granted to share info w Relationship: Henry Figueroa (sister) [phone]     Emotional Assessment Appearance:: Appears stated age Attitude/Demeanor/Rapport: Gracious Affect (typically observed): Accepting Orientation: : Oriented to Self, Oriented to Place, Oriented to  Time, Oriented to Situation Alcohol / Substance Use: Not Applicable Psych Involvement: No (comment)  Admission diagnosis:  Acute CHF (congestive heart failure) (HCC) [I50.9] Patient Active Problem List   Diagnosis Date Noted   Chronic pulmonary embolism (HCC) 06/03/2024   Hypertensive urgency 06/03/2024   Ascending aortic aneurysm 06/03/2024   OSA on CPAP    Depression    Acute CHF (congestive heart failure) (HCC) 06/02/2024   Abnormal electrocardiogram (ECG) (EKG) 03/23/2024   Chronic deep vein thrombosis (DVT) (HCC) 03/23/2024   Renal failure syndrome 03/23/2024   Encounter for general adult medical examination with abnormal findings 04/10/2023   Need for vaccination 04/09/2023   Chronic hyperglycemia 04/09/2023   Proteinuria 07/08/2022   Venous insufficiency of both lower extremities 04/07/2022   Mobitz type 1 second degree AV block 12/26/2021   Pure hypercholesterolemia 12/26/2021   Hypotension 12/23/2021   Hypokalemia 12/23/2021   Benign prostatic hyperplasia without lower urinary tract symptoms 06/10/2020   Primary hypertriglyceridemia 10/09/2019   Screen for  colon cancer 05/30/2019   Atrial flutter (HCC) 06/07/2018   OSA (obstructive sleep apnea) 05/03/2018   Chronic intermittent hypoxia with obstructive sleep apnea 05/03/2018   Herniated lumbar disc without myelopathy 04/22/2018   GERD with apnea 12/21/2017   Vitamin D  deficiency 10/31/2017    Seasonal allergic rhinitis due to pollen 10/29/2017   Routine general medical examination at a health care facility 10/19/2016   Trochanteric bursitis of left hip 09/14/2015   Basal cell carcinoma of back 03/26/2015   Sensory peripheral neuropathy 03/26/2015   Essential hypertension, benign 10/29/2014   Hyperlipidemia with target LDL less than 160 10/29/2014   Primary osteoarthritis of both knees 10/29/2014   PCP:  Joshua Debby CROME, MD Pharmacy:   [facility] [phone] - [place], [place] - [address] AT CORNER OF [facility] [address] [place] [address] Phone: [phone] Fax: [phone]  [facility] - [address] [place] - [address] [place] [address] Phone: [phone] Fax: [phone]  [place] [facility] [address], [address] [place] [address] Phone: [phone] Fax: [phone]     Social Drivers of Health (SDOH) Social History: SDOH Screenings   Food Insecurity: No Food Insecurity (06/03/2024)  Housing: Low Risk  (06/03/2024)  Transportation Needs: No Transportation Needs (06/03/2024)  Utilities: Not At Risk (06/03/2024)  Alcohol Screen: Low Risk  (06/13/2023)  Depression (PHQ2-9): Low Risk  (03/02/2024)  Financial Resource Strain: Low Risk  (06/13/2023)  Physical Activity: Inactive (06/13/2023)  Social Connections: Moderately Integrated (06/03/2024)  Stress: No Stress Concern Present (06/13/2023)  Tobacco Use: Medium Risk (06/02/2024)  Health Literacy: Adequate Health Literacy (06/17/2023)   SDOH Interventions: Food Insecurity Interventions: Intervention Not Indicated, Inpatient TOC Housing Interventions: Intervention Not Indicated, Inpatient TOC Transportation Interventions: Intervention Not Indicated, Inpatient TOC Utilities Interventions: Intervention Not Indicated, Inpatient TOC   Readmission Risk Interventions     No data to display

## 2024-06-03 NOTE — Progress Notes (Signed)
 IP CM consult for Heart Failure Home Health Screen; orders for Heart Failure Navigation Team previously placed; clearing consult.

## 2024-06-03 NOTE — ED Notes (Signed)
 2nd attempt to call report to [id number] was unsuccessful.

## 2024-06-04 ENCOUNTER — Other Ambulatory Visit (HOSPITAL_COMMUNITY): Payer: Self-pay

## 2024-06-04 DIAGNOSIS — I5031 Acute diastolic (congestive) heart failure: Secondary | ICD-10-CM | POA: Diagnosis not present

## 2024-06-04 LAB — BASIC METABOLIC PANEL WITH GFR
Anion gap: 14 (ref 5–15)
BUN: 18 mg/dL (ref 8–23)
CO2: 30 mmol/L (ref 22–32)
Calcium: 9.7 mg/dL (ref 8.9–10.3)
Chloride: 97 mmol/L — ABNORMAL LOW (ref 98–111)
Creatinine, Ser: 1.21 mg/dL (ref 0.61–1.24)
GFR, Estimated: >60 mL/min (ref >60)
Glucose, Bld: 107 mg/dL — ABNORMAL HIGH (ref 70–99)
Potassium: 3.7 mmol/L (ref 3.5–5.1)
Sodium: 141 mmol/L (ref 135–145)

## 2024-06-04 LAB — CBC
HCT: 43.3 % (ref 39.0–52.0)
Hemoglobin: 14.1 g/dL (ref 13.0–17.0)
MCH: 32.4 pg (ref 26.0–34.0)
MCHC: 32.6 g/dL (ref 30.0–36.0)
MCV: 99.5 fL (ref 80.0–100.0)
Platelets: 235 K/uL (ref 150–400)
RBC: 4.35 MIL/uL (ref 4.22–5.81)
RDW: 12.3 % (ref 11.5–15.5)
WBC: 8.1 K/uL (ref 4.0–10.5)
nRBC: 0 % (ref 0.0–0.2)

## 2024-06-04 LAB — MAGNESIUM: Magnesium: 1.8 mg/dL (ref 1.7–2.4)

## 2024-06-04 MED ORDER — FUROSEMIDE 20 MG PO TABS
20.0000 mg | ORAL_TABLET | Freq: Every day | ORAL | 0 refills | Status: DC
  Filled 2024-06-04: qty 30, 30d supply, fill #0

## 2024-06-04 MED ORDER — FUROSEMIDE 20 MG PO TABS: 20.0000 mg | ORAL_TABLET | Freq: Every day | ORAL | 0 refills | Status: DC

## 2024-06-04 NOTE — Hospital Course (Addendum)
 Henry Figueroa is a 68 y.o. 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  Patient admitted due to concern for hypotension urgency and congestive heart failure. Echo came back with normal EF, patient has been switched to oral diuretics Net IO Since Admission: -2,850 mL [06/04/24 0843]  Overall stable and he is eager to go home this am.  Subjective: Seen and examined today Overnight on room air afebrile, VSS-BP down in 140s, Labs stable renal function  Discharge Diagnoses:   Acute CHFpEF Progressive shortness of breath leg swelling Elevated troponin/with demand ischemia: Workup with elevated proBNP, elevated troponin from demand ischemia but flat, with uncontrolled hypertension Echo with EF 65-70, no RWMA mild LVH indeterminate diastolic parameter RV SF low normal size is normal MV normal aortic valve not well-seen.  S/P intravenous Lasix changed to PO. For home cont Lasix 20 mg> cont fluid restriction diet, fu PCP  advised to check bmp in 3-5 days form PCP  Hypertensive urgency: BP has stabilized continue Coreg , diuretics lwo dose and resume home ARBs. Check BM before taking meds at home and to see PCP next week   Hx of RLE DVT and PE:  No acute DVT or PE on imaging in ED. see CT finding above.  Continue Eliquis      Hypokalemia  Resolved   Major moderate  depression  Stable, continue Wellbutrin  and Cymbalta     Ascending aortic aneurysm  Increased to 51 mm on CT in ED-Patient aware; this has been followed by cardiology    Hx of atrial flutter s/p ablation  Nsr   OSA  Unable to tolerate CPAP, he is looking into other treatment modalities   Class I Obesity w/ Body mass index is 32.05 kg/m.: Will benefit with PCP follow-up, weight loss,healthy lifestyle  Eliquis  DVT prophylaxis: eliquis  Code Status:   Code Status: Full Code Family Communication: plan of care discussed with patient/ at bedside. Patient status is: Remains hospitalized because of severity of illness Level of care: Telemetry   Dispo: The patient is from:Home            Anticipated disposition:TBD Objective: Vitals last 24 hrs: Vitals:   06/03/24 1349 06/03/24 2102 06/04/24 0500 06/04/24 0509  BP: (!) 175/106 (!) 150/93  (!) 149/99  Pulse: 78 83  70  Resp:  20  16  Temp: 98.5 F (36.9 C) 98 F (36.7 C)  97.6 F (36.4 C)  TempSrc: Oral Oral  Oral  SpO2: 94% 98%  95%  Weight:   107.2 kg   Height:       Physical Examination: General exam: alert awake, oriented, older than stated age HEENT:Oral mucosa moist, Ear/Nose WNL grossly Respiratory system: Bilaterally clear BS,no use of accessory muscle Cardiovascular system: S1 & S2 +, No JVD. Gastrointestinal system: Abdomen soft,NT,ND, BS+ Nervous System: Alert, awake, moving all extremities,and following commands. Extremities: extremities warm, leg edema neg Skin: Warm, no  rashes MSK: Normal muscle bulk,tone, power   Medications reviewed:  Scheduled Meds:  apixaban   5 mg Oral BID   buPROPion   150 mg Oral Daily   carvedilol   6.25 mg Oral BID WC   DULoxetine   60 mg Oral Daily   furosemide  40 mg Oral Daily   potassium chloride   20 mEq Oral Daily   rosuvastatin   10 mg Oral Daily   sodium chloride  flush  3 mL Intravenous Q12H   Continuous Infusions: Diet: Diet Order             Diet Heart Room service  appropriate? Yes; Fluid consistency: Thin  Diet effective now

## 2024-06-04 NOTE — Discharge Summary (Signed)
 Physician Discharge Summary  Henry Figueroa:[id number] DOB: 30-Sep-1955 DOA: 06/02/2024  PCP: Joshua Debby CROME, MD  Admit date: 06/02/2024 Discharge date: 06/04/2024 Recommendations for Outpatient Follow-up:  Follow up with PCP in 1 weeks-call for appointment Please obtain BMP/CBC in one week  check bmp in 3-5 days form PCP Check BP before taking meds.  Discharge Dispo: home Discharge Condition: Stable Code Status:   Code Status: Full Code Diet recommendation:  Diet Order             Diet - low sodium heart healthy           Diet Heart Room service appropriate? Yes; Fluid consistency: Thin  Diet effective now                    Brief/Interim Summary: Henry Figueroa is a 68 y.o. 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  Patient admitted due to concern for hypotension urgency and congestive heart failure. Echo came back with normal EF, patient has been switched to oral diuretics Net IO Since Admission: -2,850 mL [06/04/24 0843]  Overall stable and he is eager to go home this am.  Subjective: Seen and examined today Overnight on room air afebrile, VSS-BP down in 140s, Labs stable renal function  Discharge Diagnoses:   Acute CHFpEF Progressive shortness of breath leg swelling Elevated troponin/with demand ischemia: Workup with elevated proBNP,  elevated troponin from demand ischemia but flat, with uncontrolled hypertension Echo with EF 65-70, no RWMA mild LVH indeterminate diastolic parameter RV SF low normal size is normal MV normal aortic valve not well-seen.  S/P intravenous Lasix changed to PO. For home cont Lasix 20 mg> cont fluid restriction diet, fu PCP  advised to check bmp in 3-5 days form PCP  Hypertensive urgency: BP has stabilized continue Coreg , diuretics lwo dose and resume home ARBs. Check BM before taking meds at home and to see PCP next week   Hx of RLE DVT and PE:  No acute DVT or PE on imaging in ED. see CT finding above.  Continue Eliquis      Hypokalemia  Resolved   Major moderate depression  Stable, continue Wellbutrin  and Cymbalta     Ascending aortic aneurysm  Increased to 51 mm on CT in ED-Patient aware; this has been followed by cardiology    Hx of atrial flutter s/p ablation  Nsr   OSA  Unable to tolerate CPAP, he is looking into other treatment modalities   Class I Obesity w/ Body mass index is 32.05 kg/m.: Will benefit with PCP follow-up, weight loss,healthy lifestyle  Eliquis  DVT prophylaxis: eliquis  Code Status:   Code Status: Full Code Family Communication: plan of care discussed with patient/ at bedside. Patient status is: Remains hospitalized because of severity of illness Level of care: Telemetry   Dispo: The patient is from:Home  Anticipated disposition:TBD Objective: Vitals last 24 hrs: Vitals:   06/03/24 1349 06/03/24 2102 06/04/24 0500 06/04/24 0509  BP: (!) 175/106 (!) 150/93  (!) 149/99  Pulse: 78 83  70  Resp:  20  16  Temp: 98.5 F (36.9 C) 98 F (36.7 C)  97.6 F (36.4 C)  TempSrc: Oral Oral  Oral  SpO2: 94% 98%  95%  Weight:   107.2 kg   Height:       Physical Examination: General exam: alert awake, oriented, older than stated age HEENT:Oral mucosa moist, Ear/Nose WNL grossly Respiratory system: Bilaterally clear BS,no use of accessory  muscle Cardiovascular system: S1 & S2 +, No JVD. Gastrointestinal system: Abdomen soft,NT,ND, BS+ Nervous System: Alert, awake, moving all extremities,and following commands. Extremities: extremities warm, leg edema neg Skin: Warm, no rashes MSK: Normal muscle bulk,tone, power   Medications reviewed:  Scheduled Meds:  apixaban   5 mg Oral BID   buPROPion   150 mg Oral Daily   carvedilol   6.25 mg Oral BID WC   DULoxetine   60 mg Oral Daily   furosemide  40 mg Oral Daily   potassium chloride   20 mEq Oral Daily   rosuvastatin   10 mg Oral Daily   sodium chloride  flush  3 mL Intravenous Q12H   Continuous Infusions: Diet: Diet Order             Diet Heart Room service appropriate? Yes; Fluid consistency: Thin  Diet effective now                      Consultation: See note.  Discharge Instructions  Discharge Instructions     (HEART FAILURE PATIENTS) Call MD:  Anytime you have any of the following symptoms: 1) 3 pound weight gain in 24 hours or 5 pounds in 1 week 2) shortness of breath, with or without a dry hacking cough 3) swelling in the hands, feet or stomach 4) if you have to sleep on extra pillows at night in order to breathe.   Complete by: As directed    Diet - low sodium heart healthy   Complete by: As directed    Discharge instructions   Complete by: As directed    Check you BLOOD PRESSURE before taking meds at home  Please call call MD or return to ER for similar or worsening recurring problem that brought you to hospital or if any fever,nausea/vomiting,abdominal pain, uncontrolled pain, chest pain,  shortness of breath or any other alarming symptoms.  Please follow-up your doctor as instructed in a week time and call the office for appointment.  Please avoid alcohol, smoking, or any other illicit substance and maintain healthy habits including taking your regular medications as prescribed.  You were cared for by a hospitalist during your hospital stay. If you  have any questions about your discharge medications or the care you received while you were in the hospital after you are discharged, you can call the unit and ask to speak with the hospitalist on call if the hospitalist that took care of you is not available.  Once you are discharged, your primary care physician will handle any further medical issues. Please note that NO REFILLS for any discharge medications will be authorized once you are discharged, as it is imperative that you return to your primary care physician (or establish a relationship with a primary care physician if you do not have one) for your aftercare needs so that they can reassess your need for medications  and monitor your lab values   Increase activity slowly   Complete by: As directed       Allergies as of 06/04/2024   No Known Allergies      Medication List     STOP taking these medications    ondansetron  4 MG disintegrating tablet Commonly known as: ZOFRAN -ODT       TAKE these medications    acetaminophen  650 MG CR tablet Commonly known as: TYLENOL  Take 1,300 mg by mouth every 8 (eight) hours as needed for pain.   apixaban  5 MG Tabs tablet Commonly known as: Eliquis  Take 1 tablet (5 mg total) by mouth 2 (two) times daily. Start taking after completion of starter pack.   buPROPion  150 MG 24 hr tablet Commonly known as: WELLBUTRIN  XL TAKE 1 TABLET BY MOUTH EVERY DAY   carvedilol  6.25 MG tablet Commonly known as: COREG  TAKE 1 TABLET TWICE DAILY WITH MEALS   Comirnaty syringe Generic drug: COVID-19 mRNA vaccine (Pfizer) Inject into the muscle.   DULoxetine  60 MG capsule Commonly known as: CYMBALTA  TAKE 1 CAPSULE BY MOUTH EVERY DAY   furosemide 20 MG tablet Commonly known as: LASIX Take 1 tablet (20 mg total) by mouth daily. Start taking on: June 05, 2024   Klor-Con  M15 15 MEQ tablet Generic drug: potassium chloride  SA TAKE 1 TABLET THREE TIMES DAILY What changed: when to take this    olmesartan  40 MG tablet Commonly known as: BENICAR  Take 40 mg by mouth daily.   pantoprazole  40 MG tablet Commonly known as: PROTONIX  Take 1 tablet (40 mg total) by mouth daily. What changed:  when to take this reasons to take this   rosuvastatin  10 MG tablet Commonly known as: CRESTOR  TAKE 1 TABLET EVERY DAY   SUCRALFATE  Take 1 g by mouth daily.   tiZANidine  4 MG tablet Commonly known as: Zanaflex  Take 1 tablet (4 mg total) by mouth every 6 (six) hours as needed for muscle spasms.        Follow-up Information     Joshua Debby CROME, MD Follow up in 1 week(s).   Specialty: Internal Medicine Contact information: [address] [place] [address] [phone]                No Known Allergies  The results of significant diagnostics from this hospitalization (including imaging, microbiology, ancillary and laboratory) are listed below for reference.    Microbiology: No results found for this or any previous visit (from the past 240 hours).  Procedures/Studies: ECHOCARDIOGRAM COMPLETE Result Date: 06/03/2024    ECHOCARDIOGRAM REPORT   Patient Name:   TALLEY KREISER Date of Exam: 06/03/2024 Medical Rec #:  [id number]       Height:       72.0 in Accession #:    [id number]      Weight:       238.8 lb Date of Birth:  1955/10/20       BSA:          2.297 m Patient Age:    68 years        BP:           194/129 mmHg Patient Gender: M               HR:           82 bpm. Exam Location:  Inpatient Procedure: 2D Echo, Cardiac Doppler, Color Doppler and Intracardiac            Opacification  Agent (Both Spectral and Color Flow Doppler were            utilized during procedure). Indications:    CHF-Acute Diastolic I50.31  History:        Patient has prior history of Echocardiogram examinations, most                 recent 04/23/2018. Arrythmias:Atrial Fibrillation; Risk                 Factors:Hypertension and Sleep Apnea.  Sonographer:    Jayson Gaskins Referring Phys: [id number]  TIMOTHY S OPYD IMPRESSIONS  1. Poor acoustic windows Definity used. . Left ventricular ejection fraction, by estimation, is 65 to 70%. The left ventricle has normal function. The left ventricle has no regional wall motion abnormalities. There is mild left ventricular hypertrophy. Left ventricular diastolic parameters are indeterminate.  2. Right ventricular systolic function is low normal. The right ventricular size is normal.  3. The mitral valve is normal in structure. Trivial mitral valve regurgitation.  4. AV not well seen Mean gradient through the valve is 6 mm Hg . SABRA The aortic valve was not well visualized. Aortic valve regurgitation is mild.  5. The inferior vena cava is normal in size with greater than 50% respiratory variability, suggesting right atrial pressure of 3 mmHg. FINDINGS  Left Ventricle: Poor acoustic windows Definity used. Left ventricular ejection fraction, by estimation, is 65 to 70%. The left ventricle has normal function. The left ventricle has no regional wall motion abnormalities. Definity contrast agent was given  IV to delineate the left ventricular endocardial borders. The left ventricular internal cavity size was normal in size. There is mild left ventricular hypertrophy. Left ventricular diastolic parameters are indeterminate. Right Ventricle: The right ventricular size is normal. Right vetricular wall thickness was not assessed. Right ventricular systolic function is low normal. Left Atrium: Left atrial size was normal in size. Right Atrium: Right atrial size was normal in size. Pericardium: There is no evidence of pericardial effusion. Mitral Valve: The mitral valve is normal in structure. Trivial mitral valve regurgitation. Tricuspid Valve: The tricuspid valve is normal in structure. Tricuspid valve regurgitation is trivial. Aortic Valve: AV not well seen Mean gradient through the valve is 6 mm Hg. The aortic valve was not well visualized. Aortic valve regurgitation is mild.  Aortic regurgitation PHT measures 667 msec. Aortic valve mean gradient measures 6.0 mmHg. Aortic valve peak gradient measures 9.6 mmHg. Aortic valve area, by VTI measures 2.36 cm. Pulmonic Valve: The pulmonic valve was not well visualized. Pulmonic valve regurgitation is not visualized. No evidence of pulmonic stenosis. Aorta: The aortic root is normal in size and structure. Venous: The inferior vena cava is normal in size with greater than 50% respiratory variability, suggesting right atrial pressure of 3 mmHg. IAS/Shunts: No atrial level shunt detected by color flow Doppler.  LEFT VENTRICLE PLAX 2D LVIDd:         5.40 cm LVIDs:         3.90 cm LV PW:         1.30 cm LV IVS:        1.00 cm LVOT diam:     2.00 cm LV SV:         54 LV SV Index:   24 LVOT Area:     3.14 cm  RIGHT VENTRICLE RV S prime:     15.90 cm/s TAPSE (M-mode): 2.6 cm LEFT ATRIUM  Index        RIGHT ATRIUM           Index LA Vol (A2C):   76.4 ml 33.26 ml/m  RA Area:     23.60 cm LA Vol (A4C):   48.6 ml 21.16 ml/m  RA Volume:   69.90 ml  30.43 ml/m LA Biplane Vol: 62.3 ml 27.12 ml/m  AORTIC VALVE AV Area (Vmax):    2.39 cm AV Area (Vmean):   2.69 cm AV Area (VTI):     2.36 cm AV Vmax:           155.00 cm/s AV Vmean:          111.000 cm/s AV VTI:            0.230 m AV Peak Grad:      9.6 mmHg AV Mean Grad:      6.0 mmHg LVOT Vmax:         118.00 cm/s LVOT Vmean:        95.200 cm/s LVOT VTI:          0.173 m LVOT/AV VTI ratio: 0.75 AI PHT:            667 msec  AORTA Ao Root diam: 3.90 cm MITRAL VALVE MV Area (PHT): 4.04 cm    SHUNTS MV Decel Time: 188 msec    Systemic VTI:  0.17 m MV E velocity: 86.50 cm/s  Systemic Diam: 2.00 cm Vina Gull MD Electronically signed by Vina Gull MD Signature Date/Time: 06/03/2024/12:50:21 PM    Final    CT VENOGRAM ABD/PEL Result Date: 06/02/2024 EXAM: CT ABDOMEN AND PELVIS WITH CONTRAST 06/02/2024 09:24:26 PM TECHNIQUE: CT of the abdomen and pelvis was performed with the administration of 125  mL of iohexol  (OMNIPAQUE ) 350 MG/ML injection. Multiplanar reformatted images are provided for review. Automated exposure control, iterative reconstruction, and/or weight-based adjustment of the mA/kV was utilized to reduce the radiation dose to as low as reasonably achievable. COMPARISON: MRI abdomen 11/05/2022 and same day lower extremity ultrasound . CLINICAL HISTORY: possible IVC clot FINDINGS: LOWER CHEST: No acute abnormality. LIVER: The liver is unremarkable. GALLBLADDER AND BILE DUCTS: Cholecystectomy. No biliary ductal dilatation. SPLEEN: No acute abnormality. PANCREAS: 10.2 cm cystic lesion in the tail of the pancreas previously evaluated with MRI 11/05/2022. See that report for details and recommendations. ADRENAL GLANDS: No acute abnormality. KIDNEYS, URETERS AND BLADDER: Nonobstructing left nephrolithiasis. 3 mm stone in the distal right ureter. No hydronephrosis. Symmetric nephrograms. No perinephric or periureteral stranding. Urinary bladder is unremarkable. GI AND BOWEL: Stomach demonstrates no acute abnormality. Normal appendix. There is no bowel obstruction. PERITONEUM AND RETROPERITONEUM: No ascites. No free air. VASCULATURE: Aorta is normal in caliber with atherosclerotic calcification. Nonocclusive thrombus in the right common femoral vein (series 2, image 75). Otherwise, no thrombus in the IVC, renal veins, iliac veins, or left femoral vein. Portal and mesenteric veins are patent without thrombus. No thrombus in the splenic vein. LYMPH NODES: No lymphadenopathy. REPRODUCTIVE ORGANS: No acute abnormality. BONES AND SOFT TISSUES: Chronic compression fracture of L1. No acute osseous abnormality. No focal soft tissue abnormality. IMPRESSION: 1. Nonocclusive thrombus in the right common femoral vein. No thrombus in the IVC or iliac veins . 2. 2 - 3 mm stone in the distal right ureter without right hydronephrosis. Nonobstructing left nephrolithiasis. 3. 10.2 cm cystic lesion in the pancreatic tail,  previously evaluated on MRI 11/05/22; refer to that report for characterization and management recommendations. Electronically signed by: Norman Gatlin MD  06/02/2024 09:41 PM EST RP Workstation: [id number]   CT Angio Chest PE W and/or Wo Contrast Result Date: 06/02/2024 EXAM: CTA of the Chest without and with contrast for PE 06/02/2024 09:24:26 PM TECHNIQUE: CTA of the chest was performed without and with the administration of 125 mL of iohexol  (OMNIPAQUE ) 350 MG/ML injection. Multiplanar reformatted images are provided for review. MIP images are provided for review. Automated exposure control, iterative reconstruction, and/or weight based adjustment of the mA/kV was utilized to reduce the radiation dose to as low as reasonably achievable. COMPARISON: Comparison with 08/05/2023 and same day chest radiograph. CLINICAL HISTORY: Pulmonary embolism (PE) suspected, high prob. FINDINGS: PULMONARY ARTERIES: Pulmonary arteries are adequately opacified for evaluation. Eccentric, nonocclusive filling defects in the right lower lobe pulmonary artery (series 5, image 170) are compatible with chronic thromboembolism. Dilated main pulmonary artery measuring 45 mm. MEDIASTINUM: The heart and pericardium demonstrate no acute abnormality. Coronary artery and aortic atherosclerotic calcification. Ascending aortic aneurysm measuring 51 mm. LYMPH NODES: No mediastinal, hilar or axillary lymphadenopathy. LUNGS AND PLEURA: The lungs are without acute process. No focal consolidation or pulmonary edema. No pleural effusion or pneumothorax. UPPER ABDOMEN: Limited images of the upper abdomen are unremarkable. SOFT TISSUES AND BONES: Chronic compression fracture of L1. No acute soft tissue abnormality. IMPRESSION: 1. 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 No evidence of acute pulmonary embolism. 2. Dilated main pulmonary artery measuring 45 mm, which may reflect pulmonary hypertension. 3. Ascending  aortic aneurysm measuring 51 mm. Establish or continue care with cardiothoracic/vascular surgery for management and surveillance. Electronically signed by: Norman Gatlin MD 06/02/2024 09:33 PM EST RP Workstation: [id number]   US  Venous Img Lower Bilateral Result Date: 06/02/2024 CLINICAL DATA:  History of right DVT post thrombectomy in June, lower extremity swelling EXAM: BILATERAL LOWER EXTREMITY VENOUS DOPPLER ULTRASOUND TECHNIQUE: Gray-scale sonography with graded compression, as well as color Doppler and duplex ultrasound were performed to evaluate the lower extremity deep venous systems from the level of the common femoral vein and including the common femoral, femoral, profunda femoral, popliteal and calf veins including the posterior tibial, peroneal and gastrocnemius veins when visible. The superficial great saphenous vein was also interrogated. Spectral Doppler was utilized to evaluate flow at rest and with distal augmentation maneuvers in the common femoral, femoral and popliteal veins. COMPARISON:  None Available. FINDINGS: RIGHT LOWER EXTREMITY Common Femoral Vein: Positive for nonocclusive thrombus. Incompletely compressible. Saphenofemoral Junction: No evidence of thrombus. Normal compressibility and flow on color Doppler imaging. Profunda Femoral Vein: No evidence of thrombus. Normal compressibility and flow on color Doppler imaging. Femoral Vein: Positive for small volume nonocclusive thrombus. Incompletely compressible. Popliteal Vein: Positive for nonocclusive thrombus. Incompletely compressible. Calf Veins: Poorly visible. Superficial Great Saphenous Vein: No evidence of thrombus. Normal compressibility. Other Findings:  Edema within the subcutaneous soft tissues LEFT LOWER EXTREMITY Common Femoral Vein: No evidence of thrombus. Normal compressibility, respiratory phasicity and response to augmentation. Saphenofemoral Junction: No evidence of thrombus. Normal compressibility and flow on color  Doppler imaging. Profunda Femoral Vein: No evidence of thrombus. Normal compressibility and flow on color Doppler imaging. Femoral Vein: Positive for nonocclusive thrombus. Incompletely compressible. Popliteal Vein: No evidence of thrombus. Normal compressibility, respiratory phasicity and response to augmentation. Calf Veins: Poorly visualized Superficial Great Saphenous Vein: No evidence of thrombus. Normal compressibility. Other Findings:  Edema within the subcutaneous soft tissues IMPRESSION: 1. Positive for nonocclusive DVT within the right common femoral, femoral and popliteal veins. 2. No evidence for left lower extremity  DVT. Electronically Signed   By: Luke Bun M.D.   On: 06/02/2024 20:28   DG Chest Portable 1 View Result Date: 06/02/2024 EXAM: 1 VIEW(S) XRAY OF THE CHEST 06/02/2024 06:33:00 PM COMPARISON: 03/23/2024 CLINICAL HISTORY: sob FINDINGS: LUNGS AND PLEURA: No focal pulmonary opacity. No pulmonary edema. No pleural effusion. No pneumothorax. HEART AND MEDIASTINUM: Cardiomegaly, unchanged. Calcified aorta. BONES AND SOFT TISSUES: No acute osseous abnormality. IMPRESSION: 1. No acute cardiopulmonary process. 2. Cardiomegaly, unchanged from prior. Electronically signed by: Norman Gatlin MD 06/02/2024 07:19 PM EST RP Workstation: [id number]    Labs: BNP (last 3 results) No results for input(s): BNP in the last 8760 hours. Basic Metabolic Panel: Recent Labs  Lab 06/02/24 1840 06/03/24 0452 06/04/24 0437  NA 141 142 141  K 3.2* 3.1* 3.7  CL 103 102 97*  CO2 27 25 30   GLUCOSE 111* 111* 107*  BUN 20 15 18   CREATININE 1.08 0.98 1.21  CALCIUM  10.0 9.6 9.7  MG  --   --  1.8   Liver Function Tests: Recent Labs  Lab 06/02/24 1840  AST 19  ALT 11  ALKPHOS 52  BILITOT 0.5  PROT 7.5  ALBUMIN  4.8   No results for input(s): LIPASE, AMYLASE in the last 168 hours. No results for input(s): AMMONIA in the last 168 hours. CBC: Recent Labs  Lab 06/02/24 1840  06/03/24 0452 06/04/24 0437  WBC 6.5 9.4 8.1  HGB 13.1 13.7 14.1  HCT 38.0* 40.5 43.3  MCV 95.7 96.9 99.5  PLT 220 228 235  No results for input(s): TSH, T4TOTAL, T3FREE, THYROIDAB in the last 72 hours.  Invalid input(s): FREET3 Urinalysis    Component Value Date/Time   COLORURINE YELLOW 03/23/2024 1109   APPEARANCEUR CLEAR 03/23/2024 1109   LABSPEC 1.020 03/23/2024 1109   PHURINE 6.0 03/23/2024 1109   GLUCOSEU NEGATIVE 03/23/2024 1109   HGBUR NEGATIVE 03/23/2024 1109   BILIRUBINUR NEGATIVE 03/23/2024 1109   KETONESUR NEGATIVE 03/23/2024 1109   UROBILINOGEN 0.2 03/23/2024 1109   NITRITE NEGATIVE 03/23/2024 1109   LEUKOCYTESUR NEGATIVE 03/23/2024 1109   Sepsis Labs Recent Labs  Lab 06/02/24 1840 06/03/24 0452 06/04/24 0437  WBC 6.5 9.4 8.1   Microbiology No results found for this or any previous visit (from the past 240 hours).  Time coordinating discharge:   25 minutes  SIGNED: Mennie LAMY, MD  [facility] 06/04/2024, 10:42 AM  If 7PM-7AM, please contact night-coverage [email or web address]

## 2024-06-04 NOTE — TOC Transition Note (Signed)
 Transition of Care [facility]) - Discharge Note   Patient Details  Name: Henry Figueroa MRN: [id number] Date of Birth: Nov 16, 1955  [facility] of Care [facility] CM/SW Contact:  Sonda Manuella Quill, RN Phone Number: 06/04/2024, 12:05 PM   Clinical Narrative:    D/C orders received; no IP CM needs.   Final next level of care: Home/Self Care Barriers to Discharge: No Barriers Identified   Patient Goals and CMS Choice Patient states their goals for this hospitalization and ongoing recovery are:: home          Discharge Placement                       Discharge Plan and Services Additional resources added to the After Visit Summary for     Discharge Planning Services: CM Consult            DME Arranged: N/A DME Agency: NA       HH Arranged: NA HH Agency: NA        Social Drivers of Health (SDOH) Interventions SDOH Screenings   Food Insecurity: No Food Insecurity (06/03/2024)  Housing: Low Risk  (06/03/2024)  Transportation Needs: No Transportation Needs (06/03/2024)  Utilities: Not At Risk (06/03/2024)  Alcohol Screen: Low Risk  (06/13/2023)  Depression (PHQ2-9): Low Risk  (03/02/2024)  Financial Resource Strain: Low Risk  (06/13/2023)  Physical Activity: Inactive (06/13/2023)  Social Connections: Moderately Integrated (06/03/2024)  Stress: No Stress Concern Present (06/13/2023)  Tobacco Use: Medium Risk (06/02/2024)  Health Literacy: Adequate Health Literacy (06/17/2023)     Readmission Risk Interventions     No data to display

## 2024-06-05 ENCOUNTER — Telehealth: Payer: Self-pay

## 2024-06-05 NOTE — Telephone Encounter (Signed)
 Copied from CRM [id number]. Topic: General - Call Back - No Documentation >> Jun 05, 2024 11:50 AM Maisie BROCKS wrote: Reason for CRM: pt called and stated he missed a call from [facility] at 11:25a. No documentation but a VM was left.

## 2024-06-05 NOTE — Telephone Encounter (Signed)
 This was handled while the patient was in the hospital this past weekend. Patient has been scheduled to see Dr. Joshua for a hospital follow up.

## 2024-06-05 NOTE — Telephone Encounter (Signed)
 This was taken care of in the hospital.

## 2024-06-05 NOTE — Telephone Encounter (Signed)
 Unable to reach patient. LMTRC

## 2024-06-05 NOTE — Telephone Encounter (Signed)
 This is being handled in another telephone call.

## 2024-06-12 ENCOUNTER — Encounter: Payer: Self-pay | Admitting: Internal Medicine

## 2024-06-12 ENCOUNTER — Ambulatory Visit: Admitting: Internal Medicine

## 2024-06-12 VITALS — BP 152/88 | HR 82 | Temp 98.5°F | Resp 16 | Ht 72.0 in | Wt 250.0 lb

## 2024-06-12 DIAGNOSIS — N062 Isolated proteinuria with diffuse membranous glomerulonephritis, unspecified: Secondary | ICD-10-CM

## 2024-06-12 DIAGNOSIS — Z125 Encounter for screening for malignant neoplasm of prostate: Secondary | ICD-10-CM | POA: Diagnosis not present

## 2024-06-12 DIAGNOSIS — E785 Hyperlipidemia, unspecified: Secondary | ICD-10-CM | POA: Diagnosis not present

## 2024-06-12 DIAGNOSIS — Z0001 Encounter for general adult medical examination with abnormal findings: Secondary | ICD-10-CM

## 2024-06-12 DIAGNOSIS — R739 Hyperglycemia, unspecified: Secondary | ICD-10-CM | POA: Diagnosis not present

## 2024-06-12 DIAGNOSIS — I10 Essential (primary) hypertension: Secondary | ICD-10-CM | POA: Diagnosis not present

## 2024-06-12 DIAGNOSIS — Z0000 Encounter for general adult medical examination without abnormal findings: Secondary | ICD-10-CM | POA: Diagnosis not present

## 2024-06-12 LAB — PSA: PSA: 3.23 ng/mL (ref 0.10–4.00)

## 2024-06-12 MED ORDER — TORSEMIDE 20 MG PO TABS: 20.0000 mg | ORAL_TABLET | Freq: Every day | ORAL | 0 refills | Status: AC

## 2024-06-12 NOTE — Patient Instructions (Signed)
 Health Maintenance, Male  Adopting a healthy lifestyle and getting preventive care are important in promoting health and wellness. Ask your health care provider about:  The right schedule for you to have regular tests and exams.  Things you can do on your own to prevent diseases and keep yourself healthy.  What should I know about diet, weight, and exercise?  Eat a healthy diet    Eat a diet that includes plenty of vegetables, fruits, low-fat dairy products, and lean protein.  Do not eat a lot of foods that are high in solid fats, added sugars, or sodium.  Maintain a healthy weight  Body mass index (BMI) is a measurement that can be used to identify possible weight problems. It estimates body fat based on height and weight. Your health care provider can help determine your BMI and help you achieve or maintain a healthy weight.  Get regular exercise  Get regular exercise. This is one of the most important things you can do for your health. Most adults should:  Exercise for at least 150 minutes each week. The exercise should increase your heart rate and make you sweat (moderate-intensity exercise).  Do strengthening exercises at least twice a week. This is in addition to the moderate-intensity exercise.  Spend less time sitting. Even light physical activity can be beneficial.  Watch cholesterol and blood lipids  Have your blood tested for lipids and cholesterol at 69 years of age, then have this test every 5 years.  You may need to have your cholesterol levels checked more often if:  Your lipid or cholesterol levels are high.  You are older than 69 years of age.  You are at high risk for heart disease.  What should I know about cancer screening?  Many types of cancers can be detected early and may often be prevented. Depending on your health history and family history, you may need to have cancer screening at various ages. This may include screening for:  Colorectal cancer.  Prostate cancer.  Skin cancer.  Lung  cancer.  What should I know about heart disease, diabetes, and high blood pressure?  Blood pressure and heart disease  High blood pressure causes heart disease and increases the risk of stroke. This is more likely to develop in people who have high blood pressure readings or are overweight.  Talk with your health care provider about your target blood pressure readings.  Have your blood pressure checked:  Every 3-5 years if you are 24-52 years of age.  Every year if you are 3 years old or older.  If you are between the ages of 60 and 72 and are a current or former smoker, ask your health care provider if you should have a one-time screening for abdominal aortic aneurysm (AAA).  Diabetes  Have regular diabetes screenings. This checks your fasting blood sugar level. Have the screening done:  Once every three years after age 66 if you are at a normal weight and have a low risk for diabetes.  More often and at a younger age if you are overweight or have a high risk for diabetes.  What should I know about preventing infection?  Hepatitis B  If you have a higher risk for hepatitis B, you should be screened for this virus. Talk with your health care provider to find out if you are at risk for hepatitis B infection.  Hepatitis C  Blood testing is recommended for:  Everyone born from [birth year] through 1966.  Anyone  with known risk factors for hepatitis C.  Sexually transmitted infections (STIs)  You should be screened each year for STIs, including gonorrhea and chlamydia, if:  You are sexually active and are younger than 69 years of age.  You are older than 69 years of age and your health care provider tells you that you are at risk for this type of infection.  Your sexual activity has changed since you were last screened, and you are at increased risk for chlamydia or gonorrhea. Ask your health care provider if you are at risk.  Ask your health care provider about whether you are at high risk for HIV. Your health care provider  may recommend a prescription medicine to help prevent HIV infection. If you choose to take medicine to prevent HIV, you should first get tested for HIV. You should then be tested every 3 months for as long as you are taking the medicine.  Follow these instructions at home:  Alcohol use  Do not drink alcohol if your health care provider tells you not to drink.  If you drink alcohol:  Limit how much you have to 0-2 drinks a day.  Know how much alcohol is in your drink. In the [place] one drink equals one 12 oz bottle of beer (355 mL), one 5 oz glass of wine (148 mL), or one 1 oz glass of hard liquor (44 mL).  Lifestyle  Do not use any products that contain nicotine or tobacco. These products include cigarettes, chewing tobacco, and vaping devices, such as e-cigarettes. If you need help quitting, ask your health care provider.  Do not use street drugs.  Do not share needles.  Ask your health care provider for help if you need support or information about quitting drugs.  General instructions  Schedule regular health, dental, and eye exams.  Stay current with your vaccines.  Tell your health care provider if:  You often feel depressed.  You have ever been abused or do not feel safe at home.  Summary  Adopting a healthy lifestyle and getting preventive care are important in promoting health and wellness.  Follow your health care provider's instructions about healthy diet, exercising, and getting tested or screened for diseases.  Follow your health care provider's instructions on monitoring your cholesterol and blood pressure.  This information is not intended to replace advice given to you by your health care provider. Make sure you discuss any questions you have with your health care provider.  Document Revised: 12/02/2020 Document Reviewed: 12/02/2020  Elsevier Patient Education  2024 [employer].

## 2024-06-12 NOTE — Progress Notes (Signed)
 "  Subjective:  Patient ID: Henry Figueroa, male    DOB: 1955-11-10  Age: 68 y.o. MRN: [id number]  CC: Hospital follow up  (Patient states that he's been a little sore since he left the hospital but he is okay. )   HPI Henry Figueroa presents for f/up  ---  Discussed the use of AI scribe software for clinical note transcription with the patient, who gave verbal consent to proceed.  History of Present Illness Henry Figueroa is a 68 year old male with hypertension who presents with concerns about blood pressure management and medication efficacy.  He was recently hospitalized due to leg swelling and significantly elevated blood pressure, recorded at 200/112 mmHg. During the hospital stay, an electrocardiogram showed no cardiac issues, and a blood clot evaluation indicated resolution with normal blood flow. He was admitted early Saturday morning and discharged on Sunday afternoon.  He is currently taking carvedilol  and recently started on oral Lasix  (furosemide ) on Monday. He notes that the oral form is not as effective as the IV Lasix  he received in the hospital, stating 'I must have peed all day long I was there.' He also takes olmesartan  for blood pressure management and rosuvastatin  (Crestor ) for cholesterol. He denies using substances that could raise blood pressure, such as pain medications, decongestants, anti-inflammatories, or THC, although he previously used THC for pain control.  He experiences some constipation and frequent urination, but finds it difficult to urinate effectively due to the Lasix . He is also taking tizanidine  for back pain.  No current use of THC for pain control. History of constipation and frequent urination.     Outpatient Medications Prior to Visit  Medication Sig Dispense Refill   acetaminophen  (TYLENOL ) 650 MG CR tablet Take 1,300 mg by mouth every 8 (eight) hours as needed for pain.     apixaban  (ELIQUIS ) 5 MG TABS tablet Take 1 tablet (5 mg total) by  mouth 2 (two) times daily. Start taking after completion of starter pack. 180 tablet 1   buPROPion  (WELLBUTRIN  XL) 150 MG 24 hr tablet TAKE 1 TABLET BY MOUTH EVERY DAY 90 tablet 0   carvedilol  (COREG ) 6.25 MG tablet TAKE 1 TABLET TWICE DAILY WITH MEALS 180 tablet 3   COVID-19 mRNA vaccine, Pfizer, (COMIRNATY ) syringe Inject into the muscle. 0.3 mL 0   DULoxetine  (CYMBALTA ) 60 MG capsule TAKE 1 CAPSULE BY MOUTH EVERY DAY 90 capsule 0   olmesartan  (BENICAR ) 40 MG tablet Take 40 mg by mouth daily.     pantoprazole  (PROTONIX ) 40 MG tablet Take 1 tablet (40 mg total) by mouth daily. (Patient taking differently: Take 40 mg by mouth daily as needed.) 90 tablet 3   potassium chloride  SA (KLOR-CON  M15) 15 MEQ tablet TAKE 1 TABLET THREE TIMES DAILY (Patient taking differently: Take 15 mEq by mouth 2 (two) times daily.) 270 tablet 0   rosuvastatin  (CRESTOR ) 10 MG tablet TAKE 1 TABLET EVERY DAY 90 tablet 3   SUCRALFATE  Take 1 g by mouth daily.     tiZANidine  (ZANAFLEX ) 4 MG tablet Take 1 tablet (4 mg total) by mouth every 6 (six) hours as needed for muscle spasms. 30 tablet 0   furosemide  (LASIX ) 20 MG tablet Take 1 tablet (20 mg total) by mouth daily. 30 tablet 0   No facility-administered medications prior to visit.    ROS Review of Systems  Constitutional:  Positive for fatigue and unexpected weight change (wt gain).  HENT: Negative.  Negative for trouble swallowing.  Eyes: Negative.  Negative for visual disturbance.  Respiratory: Negative.  Negative for cough, chest tightness, shortness of breath and wheezing.   Cardiovascular:  Positive for leg swelling. Negative for chest pain and palpitations.  Gastrointestinal:  Positive for constipation. Negative for blood in stool, diarrhea, nausea and vomiting.  Endocrine: Negative.   Genitourinary:  Positive for frequency. Negative for difficulty urinating and dysuria.  Musculoskeletal:  Positive for arthralgias, back pain and gait problem. Negative for  myalgias.  Skin: Negative.   Neurological:  Negative for dizziness and weakness.  Hematological:  Negative for adenopathy. Does not bruise/bleed easily.  Psychiatric/Behavioral:  Positive for confusion, decreased concentration and sleep disturbance.     Objective:  BP (!) 152/88 (BP Location: Left Arm, Patient Position: Sitting, Cuff Size: Large)   Pulse 82   Temp 98.5 F (36.9 C) (Oral)   Resp 16   Ht 6' (1.829 m)   Wt 250 lb (113.4 kg)   SpO2 97%   BMI 33.91 kg/m   BP Readings from Last 3 Encounters:  06/12/24 (!) 152/88  06/04/24 (!) 149/99  06/02/24 (!) 200/107    Wt Readings from Last 3 Encounters:  06/12/24 250 lb (113.4 kg)  06/04/24 236 lb 5.3 oz (107.2 kg)  03/23/24 213 lb (96.6 kg)    Physical Exam Vitals reviewed.  HENT:     Mouth/Throat:     Pharynx: Oropharynx is clear.  Eyes:     General: No scleral icterus.    Conjunctiva/sclera: Conjunctivae normal.  Cardiovascular:     Rate and Rhythm: Normal rate and regular rhythm.     Heart sounds: Murmur heard.     Systolic murmur is present with a grade of 2/6.     No diastolic murmur is present.     No friction rub. No gallop.  Pulmonary:     Effort: Pulmonary effort is normal.     Breath sounds: No stridor. No wheezing, rhonchi or rales.  Abdominal:     General: Abdomen is flat.     Palpations: There is no mass.     Tenderness: There is no abdominal tenderness. There is no guarding.     Hernia: No hernia is present.  Musculoskeletal:        General: No swelling. Normal range of motion.     Cervical back: Neck supple.     Right lower leg: Pitting Edema present.     Left lower leg: Pitting Edema present.  Lymphadenopathy:     Cervical: No cervical adenopathy.  Skin:    General: Skin is warm and dry.  Neurological:     General: No focal deficit present.     Mental Status: He is alert. Mental status is at baseline.  Psychiatric:        Mood and Affect: Mood normal.        Behavior: Behavior  normal.     Lab Results  Component Value Date   WBC 8.1 06/04/2024   HGB 14.1 06/04/2024   HCT 43.3 06/04/2024   PLT 235 06/04/2024   GLUCOSE 107 (H) 06/04/2024   CHOL 147 06/12/2024   TRIG 176.0 (H) 06/12/2024   HDL 68.10 06/12/2024   LDLDIRECT 111.0 05/30/2019   LDLCALC 44 06/12/2024   ALT 11 06/02/2024   AST 19 06/02/2024   NA 141 06/04/2024   K 3.7 06/04/2024   CL 97 (L) 06/04/2024   CREATININE 1.21 06/04/2024   BUN 18 06/04/2024   CO2 30 06/04/2024   TSH 0.78 12/31/2023  PSA 3.23 06/12/2024   INR 1.02 07/29/2018   HGBA1C 4.9 06/12/2024   MICROALBUR 1.3 06/12/2024    ECHOCARDIOGRAM COMPLETE Result Date: 06/03/2024    ECHOCARDIOGRAM REPORT   Patient Name:   QUAYSHAWN NIN Date of Exam: 06/03/2024 Medical Rec #:  [id number]       Height:       72.0 in Accession #:    [id number]      Weight:       238.8 lb Date of Birth:  Jun 28, 1956       BSA:          2.297 m Patient Age:    68 years        BP:           194/129 mmHg Patient Gender: M               HR:           82 bpm. Exam Location:  Inpatient Procedure: 2D Echo, Cardiac Doppler, Color Doppler and Intracardiac            Opacification Agent (Both Spectral and Color Flow Doppler were            utilized during procedure). Indications:    CHF-Acute Diastolic I50.31  History:        Patient has prior history of Echocardiogram examinations, most                 recent 04/23/2018. Arrythmias:Atrial Fibrillation; Risk                 Factors:Hypertension and Sleep Apnea.  Sonographer:    Jayson Gaskins Referring Phys: [id number] TIMOTHY S OPYD IMPRESSIONS  1. Poor acoustic windows Definity  used. . Left ventricular ejection fraction, by estimation, is 65 to 70%. The left ventricle has normal function. The left ventricle has no regional wall motion abnormalities. There is mild left ventricular hypertrophy. Left ventricular diastolic parameters are indeterminate.  2. Right ventricular systolic function is low normal. The right ventricular size  is normal.  3. The mitral valve is normal in structure. Trivial mitral valve regurgitation.  4. AV not well seen Mean gradient through the valve is 6 mm Hg . SABRA The aortic valve was not well visualized. Aortic valve regurgitation is mild.  5. The inferior vena cava is normal in size with greater than 50% respiratory variability, suggesting right atrial pressure of 3 mmHg. FINDINGS  Left Ventricle: Poor acoustic windows Definity  used. Left ventricular ejection fraction, by estimation, is 65 to 70%. The left ventricle has normal function. The left ventricle has no regional wall motion abnormalities. Definity  contrast agent was given  IV to delineate the left ventricular endocardial borders. The left ventricular internal cavity size was normal in size. There is mild left ventricular hypertrophy. Left ventricular diastolic parameters are indeterminate. Right Ventricle: The right ventricular size is normal. Right vetricular wall thickness was not assessed. Right ventricular systolic function is low normal. Left Atrium: Left atrial size was normal in size. Right Atrium: Right atrial size was normal in size. Pericardium: There is no evidence of pericardial effusion. Mitral Valve: The mitral valve is normal in structure. Trivial mitral valve regurgitation. Tricuspid Valve: The tricuspid valve is normal in structure. Tricuspid valve regurgitation is trivial. Aortic Valve: AV not well seen Mean gradient through the valve is 6 mm Hg. The aortic valve was not well visualized. Aortic valve regurgitation is mild. Aortic regurgitation PHT measures 667 msec. Aortic valve mean gradient measures 6.0  mmHg. Aortic valve peak gradient measures 9.6 mmHg. Aortic valve area, by VTI measures 2.36 cm. Pulmonic Valve: The pulmonic valve was not well visualized. Pulmonic valve regurgitation is not visualized. No evidence of pulmonic stenosis. Aorta: The aortic root is normal in size and structure. Venous: The inferior vena cava is normal in  size with greater than 50% respiratory variability, suggesting right atrial pressure of 3 mmHg. IAS/Shunts: No atrial level shunt detected by color flow Doppler.  LEFT VENTRICLE PLAX 2D LVIDd:         5.40 cm LVIDs:         3.90 cm LV PW:         1.30 cm LV IVS:        1.00 cm LVOT diam:     2.00 cm LV SV:         54 LV SV Index:   24 LVOT Area:     3.14 cm  RIGHT VENTRICLE RV S prime:     15.90 cm/s TAPSE (M-mode): 2.6 cm LEFT ATRIUM             Index        RIGHT ATRIUM           Index LA Vol (A2C):   76.4 ml 33.26 ml/m  RA Area:     23.60 cm LA Vol (A4C):   48.6 ml 21.16 ml/m  RA Volume:   69.90 ml  30.43 ml/m LA Biplane Vol: 62.3 ml 27.12 ml/m  AORTIC VALVE AV Area (Vmax):    2.39 cm AV Area (Vmean):   2.69 cm AV Area (VTI):     2.36 cm AV Vmax:           155.00 cm/s AV Vmean:          111.000 cm/s AV VTI:            0.230 m AV Peak Grad:      9.6 mmHg AV Mean Grad:      6.0 mmHg LVOT Vmax:         118.00 cm/s LVOT Vmean:        95.200 cm/s LVOT VTI:          0.173 m LVOT/AV VTI ratio: 0.75 AI PHT:            667 msec  AORTA Ao Root diam: 3.90 cm MITRAL VALVE MV Area (PHT): 4.04 cm    SHUNTS MV Decel Time: 188 msec    Systemic VTI:  0.17 m MV E velocity: 86.50 cm/s  Systemic Diam: 2.00 cm Vina Gull MD Electronically signed by Vina Gull MD Signature Date/Time: 06/03/2024/12:50:21 PM    Final    CT VENOGRAM ABD/PEL Result Date: 06/02/2024 EXAM: CT ABDOMEN AND PELVIS WITH CONTRAST 06/02/2024 09:24:26 PM TECHNIQUE: CT of the abdomen and pelvis was performed with the administration of 125 mL of iohexol  (OMNIPAQUE ) 350 MG/ML injection. Multiplanar reformatted images are provided for review. Automated exposure control, iterative reconstruction, and/or weight-based adjustment of the mA/kV was utilized to reduce the radiation dose to as low as reasonably achievable. COMPARISON: MRI abdomen 11/05/2022 and same day lower extremity ultrasound . CLINICAL HISTORY: possible IVC clot FINDINGS: LOWER CHEST: No  acute abnormality. LIVER: The liver is unremarkable. GALLBLADDER AND BILE DUCTS: Cholecystectomy. No biliary ductal dilatation. SPLEEN: No acute abnormality. PANCREAS: 10.2 cm cystic lesion in the tail of the pancreas previously evaluated with MRI 11/05/2022. See that report for details and recommendations. ADRENAL GLANDS: No acute abnormality. KIDNEYS,  URETERS AND BLADDER: Nonobstructing left nephrolithiasis. 3 mm stone in the distal right ureter. No hydronephrosis. Symmetric nephrograms. No perinephric or periureteral stranding. Urinary bladder is unremarkable. GI AND BOWEL: Stomach demonstrates no acute abnormality. Normal appendix. There is no bowel obstruction. PERITONEUM AND RETROPERITONEUM: No ascites. No free air. VASCULATURE: Aorta is normal in caliber with atherosclerotic calcification. Nonocclusive thrombus in the right common femoral vein (series 2, image 75). Otherwise, no thrombus in the IVC, renal veins, iliac veins, or left femoral vein. Portal and mesenteric veins are patent without thrombus. No thrombus in the splenic vein. LYMPH NODES: No lymphadenopathy. REPRODUCTIVE ORGANS: No acute abnormality. BONES AND SOFT TISSUES: Chronic compression fracture of L1. No acute osseous abnormality. No focal soft tissue abnormality. IMPRESSION: 1. Nonocclusive thrombus in the right common femoral vein. No thrombus in the IVC or iliac veins . 2. 2 - 3 mm stone in the distal right ureter without right hydronephrosis. Nonobstructing left nephrolithiasis. 3. 10.2 cm cystic lesion in the pancreatic tail, previously evaluated on MRI 11/05/22; refer to that report for characterization and management recommendations. Electronically signed by: Norman Gatlin MD 06/02/2024 09:41 PM EST RP Workstation: [id number]   CT Angio Chest PE W and/or Wo Contrast Result Date: 06/02/2024 EXAM: CTA of the Chest without and with contrast for PE 06/02/2024 09:24:26 PM TECHNIQUE: CTA of the chest was performed without and with the  administration of 125 mL of iohexol  (OMNIPAQUE ) 350 MG/ML injection. Multiplanar reformatted images are provided for review. MIP images are provided for review. Automated exposure control, iterative reconstruction, and/or weight based adjustment of the mA/kV was utilized to reduce the radiation dose to as low as reasonably achievable. COMPARISON: Comparison with 08/05/2023 and same day chest radiograph. CLINICAL HISTORY: Pulmonary embolism (PE) suspected, high prob. FINDINGS: PULMONARY ARTERIES: Pulmonary arteries are adequately opacified for evaluation. Eccentric, nonocclusive filling defects in the right lower lobe pulmonary artery (series 5, image 170) are compatible with chronic thromboembolism. Dilated main pulmonary artery measuring 45 mm. MEDIASTINUM: The heart and pericardium demonstrate no acute abnormality. Coronary artery and aortic atherosclerotic calcification. Ascending aortic aneurysm measuring 51 mm. LYMPH NODES: No mediastinal, hilar or axillary lymphadenopathy. LUNGS AND PLEURA: The lungs are without acute process. No focal consolidation or pulmonary edema. No pleural effusion or pneumothorax. UPPER ABDOMEN: Limited images of the upper abdomen are unremarkable. SOFT TISSUES AND BONES: Chronic compression fracture of L1. No acute soft tissue abnormality. IMPRESSION: 1. 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 No evidence of acute pulmonary embolism. 2. Dilated main pulmonary artery measuring 45 mm, which may reflect pulmonary hypertension. 3. Ascending aortic aneurysm measuring 51 mm. Establish or continue care with cardiothoracic/vascular surgery for management and surveillance. Electronically signed by: Norman Gatlin MD 06/02/2024 09:33 PM EST RP Workstation: [id number]   US  Venous Img Lower Bilateral Result Date: 06/02/2024 CLINICAL DATA:  History of right DVT post thrombectomy in June, lower extremity swelling EXAM: BILATERAL LOWER EXTREMITY VENOUS  DOPPLER ULTRASOUND TECHNIQUE: Gray-scale sonography with graded compression, as well as color Doppler and duplex ultrasound were performed to evaluate the lower extremity deep venous systems from the level of the common femoral vein and including the common femoral, femoral, profunda femoral, popliteal and calf veins including the posterior tibial, peroneal and gastrocnemius veins when visible. The superficial great saphenous vein was also interrogated. Spectral Doppler was utilized to evaluate flow at rest and with distal augmentation maneuvers in the common femoral, femoral and popliteal veins. COMPARISON:  None Available. FINDINGS: RIGHT LOWER EXTREMITY Common Femoral  Vein: Positive for nonocclusive thrombus. Incompletely compressible. Saphenofemoral Junction: No evidence of thrombus. Normal compressibility and flow on color Doppler imaging. Profunda Femoral Vein: No evidence of thrombus. Normal compressibility and flow on color Doppler imaging. Femoral Vein: Positive for small volume nonocclusive thrombus. Incompletely compressible. Popliteal Vein: Positive for nonocclusive thrombus. Incompletely compressible. Calf Veins: Poorly visible. Superficial Great Saphenous Vein: No evidence of thrombus. Normal compressibility. Other Findings:  Edema within the subcutaneous soft tissues LEFT LOWER EXTREMITY Common Femoral Vein: No evidence of thrombus. Normal compressibility, respiratory phasicity and response to augmentation. Saphenofemoral Junction: No evidence of thrombus. Normal compressibility and flow on color Doppler imaging. Profunda Femoral Vein: No evidence of thrombus. Normal compressibility and flow on color Doppler imaging. Femoral Vein: Positive for nonocclusive thrombus. Incompletely compressible. Popliteal Vein: No evidence of thrombus. Normal compressibility, respiratory phasicity and response to augmentation. Calf Veins: Poorly visualized Superficial Great Saphenous Vein: No evidence of thrombus. Normal  compressibility. Other Findings:  Edema within the subcutaneous soft tissues IMPRESSION: 1. Positive for nonocclusive DVT within the right common femoral, femoral and popliteal veins. 2. No evidence for left lower extremity DVT. Electronically Signed   By: Luke Bun M.D.   On: 06/02/2024 20:28   DG Chest Portable 1 View Result Date: 06/02/2024 EXAM: 1 VIEW(S) XRAY OF THE CHEST 06/02/2024 06:33:00 PM COMPARISON: 03/23/2024 CLINICAL HISTORY: sob FINDINGS: LUNGS AND PLEURA: No focal pulmonary opacity. No pulmonary edema. No pleural effusion. No pneumothorax. HEART AND MEDIASTINUM: Cardiomegaly, unchanged. Calcified aorta. BONES AND SOFT TISSUES: No acute osseous abnormality. IMPRESSION: 1. No acute cardiopulmonary process. 2. Cardiomegaly, unchanged from prior. Electronically signed by: Norman Gatlin MD 06/02/2024 07:19 PM EST RP Workstation: [id number]    Assessment & Plan:   Essential hypertension, benign- He has not achieved his BP goal. Will upgrade to a more potent loop diuretic. -     DRUG MONITORING, PANEL 7 WITH CONFIRMATION, URINE; Future -     Torsemide ; Take 1 tablet (20 mg total) by mouth daily.  Dispense: 90 tablet; Refill: 0  Chronic hyperglycemia -     Hemoglobin A1c; Future  Encounter for general adult medical examination with abnormal findings- Exam completed, labs reviewed, vaccines reviewed, cancer screenings addressed, pt ed material was given.   Prostate cancer screening -     PSA; Future  Hyperlipidemia with target LDL less than 160- LDL goal achieved. Doing well on the statin  -     Lipid panel; Future     Follow-up: Return in about 3 months (around 09/12/2024).  Debby Molt, MD "

## 2024-06-13 LAB — URINALYSIS, ROUTINE W REFLEX MICROSCOPIC
Bilirubin Urine: NEGATIVE
Hgb urine dipstick: NEGATIVE
Ketones, ur: NEGATIVE
Leukocytes,Ua: NEGATIVE
Nitrite: NEGATIVE
RBC / HPF: NONE SEEN (ref 0–?)
Specific Gravity, Urine: 1.015 (ref 1.000–1.030)
Total Protein, Urine: NEGATIVE
Urine Glucose: NEGATIVE
Urobilinogen, UA: 0.2 (ref 0.0–1.0)
WBC, UA: NONE SEEN (ref 0–?)
pH: 5.5 (ref 5.0–8.0)

## 2024-06-13 LAB — LIPID PANEL
Cholesterol: 147 mg/dL (ref 0–200)
HDL: 68.1 mg/dL (ref >39.00)
LDL Cholesterol: 44 mg/dL (ref 0–99)
NonHDL: 79.09
Total CHOL/HDL Ratio: 2
Triglycerides: 176 mg/dL — ABNORMAL HIGH (ref 0.0–149.0)
VLDL: 35.2 mg/dL (ref 0.0–40.0)

## 2024-06-13 LAB — MICROALBUMIN / CREATININE URINE RATIO
Creatinine,U: 39.2 mg/dL
Microalb Creat Ratio: 32.9 mg/g — ABNORMAL HIGH (ref 0.0–30.0)
Microalb, Ur: 1.3 mg/dL (ref 0.0–1.9)

## 2024-06-13 LAB — HEMOGLOBIN A1C: Hgb A1c MFr Bld: 4.9 % (ref 4.6–6.5)

## 2024-06-14 ENCOUNTER — Ambulatory Visit: Payer: Self-pay | Admitting: Internal Medicine

## 2024-06-14 LAB — DRUG MONITORING, PANEL 7 WITH CONFIRMATION, URINE
6 Acetylmorphine: NEGATIVE ng/mL (ref <10)
Alcohol Metabolites: NEGATIVE ng/mL (ref <500)
Amphetamines: NEGATIVE ng/mL (ref <500)
Barbiturates: NEGATIVE ng/mL (ref <300)
Benzodiazepines: NEGATIVE ng/mL (ref <100)
Cocaine Metabolite: NEGATIVE ng/mL (ref <150)
Creatinine: 36 mg/dL (ref >=20.0)
Marijuana Metabolite: NEGATIVE ng/mL (ref <20)
Marijuana Metabolite: NEGATIVE ng/mL (ref <5)
Methadone Metabolite: NEGATIVE ng/mL (ref <100)
Opiates: NEGATIVE ng/mL (ref <100)
Oxidant: NEGATIVE ug/mL (ref <200)
Oxycodone: NEGATIVE ng/mL (ref <100)
pH: 4.7 (ref 4.5–9.0)

## 2024-06-14 LAB — DM TEMPLATE

## 2024-06-27 NOTE — Progress Notes (Deleted)
  Cardiology Office Note:  .   Date:  06/27/2024  ID:  Henry Figueroa, DOB Sep 18, 1955, MRN [id number] PCP: Henry Debby CROME, MD  [facility] Providers Cardiologist:  Henry Parchment, MD {  History of Present Illness: .   [name] is a 68 y.o. 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  Social history      Patient with history of atrial flutter status post ablation 2019 with excellent result with no documented recurrences of any arrhythmia.  Has a known aortic aneurysm that has been followed up annually on CT that has been progressive.  Now measuring 51 mm.  Patient was recently admitted 05/2024 for hypertensive emergency.  Blood pressure 200/112 on arrival.  Initially presented with bilateral lower extremity edema and DOE.  Echocardiogram with normal EF.  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  Atrial flutter - 05/2018 ablation Has done exceptionally well after his ablation without any documented recurrences.  Aortic aneurysm Ascending aortic aneurysm 05/2024 measuring 5.1 cm on CTA.  Should have repeat scan in 6 months.  Send referral to vascular surgery.  Aortopathy?  Has recent admission with hypertensive emergency.  Very important that blood pressure is closely monitored and managed well.  DVT/PE - 12/2023  OSA Unable to tolerate CPAP  Hypertension  ROS: Denies: Chest pain, shortness of breath, orthopnea, peripheral edema, palpitations, decreased exercise intolerance, fatigue, lightheadedness.   Studies Reviewed: .         Risk Assessment/Calculations:   {Does this patient have ATRIAL FIBRILLATION?:[phone]} No BP recorded.  {Refresh Note OR Click  here to enter BP  :1}***       Physical Exam:   VS:  There were no vitals taken for this visit.   Wt Readings from Last 3 Encounters:  06/12/24 250 lb (113.4 kg)  06/04/24 236 lb 5.3 oz (107.2 kg)  03/23/24 213 lb (96.6 kg)    GEN: Well nourished, well developed in no acute distress NECK: No JVD; No carotid bruits CARDIAC: ***RRR, no murmurs, rubs, gallops RESPIRATORY:  Clear to auscultation without rales, wheezing or rhonchi  ABDOMEN: Soft, non-tender, non-distended EXTREMITIES:  No edema; No deformity   ASSESSMENT AND PLAN: .         {Are you ordering a CV Procedure (e.g. stress test, cath, DCCV, TEE, etc)?   Press F2        :[id number]}  Dispo: ***  Signed, Thom Figueroa Sluder, PA-C

## 2024-06-28 ENCOUNTER — Ambulatory Visit: Attending: Cardiology | Admitting: Cardiology

## 2024-06-28 ENCOUNTER — Encounter: Payer: Self-pay | Admitting: Cardiology

## 2024-06-28 DIAGNOSIS — I483 Typical atrial flutter: Secondary | ICD-10-CM

## 2024-06-28 DIAGNOSIS — I10 Essential (primary) hypertension: Secondary | ICD-10-CM

## 2024-06-28 DIAGNOSIS — G4733 Obstructive sleep apnea (adult) (pediatric): Secondary | ICD-10-CM

## 2024-06-28 DIAGNOSIS — I7121 Aneurysm of the ascending aorta, without rupture: Secondary | ICD-10-CM

## 2024-07-04 ENCOUNTER — Other Ambulatory Visit: Payer: Self-pay | Admitting: Physician Assistant

## 2024-07-04 DIAGNOSIS — I82402 Acute embolism and thrombosis of unspecified deep veins of left lower extremity: Secondary | ICD-10-CM

## 2024-07-05 ENCOUNTER — Ambulatory Visit: Admitting: Physician Assistant

## 2024-07-05 ENCOUNTER — Inpatient Hospital Stay: Attending: Physician Assistant

## 2024-07-05 VITALS — BP 171/108 | HR 107 | Temp 98.1°F | Resp 18 | Ht 72.0 in | Wt 246.3 lb

## 2024-07-05 DIAGNOSIS — Z7901 Long term (current) use of anticoagulants: Secondary | ICD-10-CM | POA: Insufficient documentation

## 2024-07-05 DIAGNOSIS — Z86718 Personal history of other venous thrombosis and embolism: Secondary | ICD-10-CM | POA: Diagnosis not present

## 2024-07-05 DIAGNOSIS — I10 Essential (primary) hypertension: Secondary | ICD-10-CM

## 2024-07-05 DIAGNOSIS — I82402 Acute embolism and thrombosis of unspecified deep veins of left lower extremity: Secondary | ICD-10-CM

## 2024-07-05 LAB — CBC WITH DIFFERENTIAL (CANCER CENTER ONLY)
Abs Immature Granulocytes: 0.02 K/uL (ref 0.00–0.07)
Basophils Absolute: 0.1 K/uL (ref 0.0–0.1)
Basophils Relative: 1 %
Eosinophils Absolute: 0.1 K/uL (ref 0.0–0.5)
Eosinophils Relative: 1 %
HCT: 44.4 % (ref 39.0–52.0)
Hemoglobin: 15.5 g/dL (ref 13.0–17.0)
Immature Granulocytes: 0 %
Lymphocytes Relative: 26 %
Lymphs Abs: 1.7 K/uL (ref 0.7–4.0)
MCH: 31.5 pg (ref 26.0–34.0)
MCHC: 34.9 g/dL (ref 30.0–36.0)
MCV: 90.2 fL (ref 80.0–100.0)
Monocytes Absolute: 0.4 K/uL (ref 0.1–1.0)
Monocytes Relative: 6 %
Neutro Abs: 4.3 K/uL (ref 1.7–7.7)
Neutrophils Relative %: 66 %
Platelet Count: 205 K/uL (ref 150–400)
RBC: 4.92 MIL/uL (ref 4.22–5.81)
RDW: 11.2 % — ABNORMAL LOW (ref 11.5–15.5)
WBC Count: 6.4 K/uL (ref 4.0–10.5)
nRBC: 0 % (ref 0.0–0.2)

## 2024-07-05 LAB — CMP (CANCER CENTER ONLY)
ALT: 22 U/L (ref 0–44)
AST: 27 U/L (ref 15–41)
Albumin: 4.9 g/dL (ref 3.5–5.0)
Alkaline Phosphatase: 61 U/L (ref 38–126)
Anion gap: 15 (ref 5–15)
BUN: 15 mg/dL (ref 8–23)
CO2: 24 mmol/L (ref 22–32)
Calcium: 9.9 mg/dL (ref 8.9–10.3)
Chloride: 103 mmol/L (ref 98–111)
Creatinine: 1.14 mg/dL (ref 0.61–1.24)
GFR, Estimated: >60 mL/min (ref >60)
Glucose, Bld: 101 mg/dL — ABNORMAL HIGH (ref 70–99)
Potassium: 4 mmol/L (ref 3.5–5.1)
Sodium: 141 mmol/L (ref 135–145)
Total Bilirubin: 0.8 mg/dL (ref 0.0–1.2)
Total Protein: 8 g/dL (ref 6.5–8.1)
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
## 2024-07-21 ENCOUNTER — Encounter: Payer: Self-pay | Admitting: Gastroenterology

## 2024-07-21 DIAGNOSIS — R112 Nausea with vomiting, unspecified: Secondary | ICD-10-CM

## 2024-07-24 ENCOUNTER — Other Ambulatory Visit: Payer: Self-pay | Admitting: Gastroenterology

## 2024-07-24 DIAGNOSIS — R112 Nausea with vomiting, unspecified: Secondary | ICD-10-CM

## 2024-07-24 MED ORDER — ONDANSETRON 4 MG PO TBDP: 4.0000 mg | ORAL_TABLET | Freq: Three times a day (TID) | ORAL | 3 refills | Status: AC | PRN

## 2024-07-24 NOTE — Progress Notes (Signed)
 Ondansetron refilled

## 2024-08-06 ENCOUNTER — Other Ambulatory Visit: Payer: Self-pay | Admitting: Internal Medicine

## 2024-08-06 DIAGNOSIS — F322 Major depressive disorder, single episode, severe without psychotic features: Secondary | ICD-10-CM

## 2024-08-11 ENCOUNTER — Ambulatory Visit

## 2024-08-11 VITALS — Ht 72.0 in | Wt 245.0 lb

## 2024-08-11 DIAGNOSIS — Z1211 Encounter for screening for malignant neoplasm of colon: Secondary | ICD-10-CM

## 2024-08-11 DIAGNOSIS — Z0000 Encounter for general adult medical examination without abnormal findings: Secondary | ICD-10-CM
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
## 2024-08-11 NOTE — Patient Instructions (Signed)
 Henry Figueroa,  Thank you for taking the time for your Medicare Wellness Visit. I appreciate your continued commitment to your health goals. Please review the care plan we discussed, and feel free to reach out if I can assist you further.  Please note that Annual Wellness Visits do not include a physical exam. Some assessments may be limited, especially if the visit was conducted virtually. If needed, we may recommend an in-person follow-up with your provider.  Ongoing Care Seeing your primary care provider every 3 to 6 months helps us  monitor your health and provide consistent, personalized care.   Referrals If a referral was made during today's visit and you haven't received any updates within two weeks, please contact the referred provider directly to check on the status.  Recommended Screenings:  Health Maintenance  Topic Date Due   Colon Cancer Screening  07/06/2020   Zoster (Shingles) Vaccine (2 of 2) 02/27/2022   Pneumococcal Vaccine for age over 36 (3 of 3 - PCV20 or PCV21) 06/09/2023   Medicare Annual Wellness Visit  06/16/2024   COVID-19 Vaccine (7 - Pfizer risk 2025-26 season) 10/09/2024   DTaP/Tdap/Td vaccine (3 - Td or Tdap) 05/11/2032   Flu Shot  Completed   Hepatitis C Screening  Completed   Meningitis B Vaccine  Aged Out       08/07/2024    3:55 PM  Advanced Directives  Does Patient Have a Medical Advance Directive? No  Would patient like information on creating a medical advance directive? No - Patient declined    Vision: Annual vision screenings are recommended for early detection of glaucoma, cataracts, and diabetic retinopathy. These exams can also reveal signs of chronic conditions such as diabetes and high blood pressure.  Dental: Annual dental screenings help detect early signs of oral cancer, gum disease, and other conditions linked to overall health, including heart disease and diabetes.  Please see the attached documents for additional preventive care  recommendations.

## 2024-08-18 ENCOUNTER — Other Ambulatory Visit: Payer: Self-pay | Admitting: Student-PharmD

## 2024-08-18 DIAGNOSIS — I82421 Acute embolism and thrombosis of right iliac vein: Secondary | ICD-10-CM

## 2024-08-24 ENCOUNTER — Other Ambulatory Visit: Payer: Self-pay | Admitting: Internal Medicine

## 2024-08-24 DIAGNOSIS — F322 Major depressive disorder, single episode, severe without psychotic features: Secondary | ICD-10-CM

## 2024-10-31 ENCOUNTER — Ambulatory Visit: Admitting: Cardiology

## 2025-01-03 ENCOUNTER — Inpatient Hospital Stay: Admitting: Physician Assistant

## 2025-01-03 ENCOUNTER — Inpatient Hospital Stay: Attending: Physician Assistant
